# Patient Record
Sex: Male | Born: 1950
Health system: Southern US, Community
[De-identification: ages and names within clinical notes are randomized; demographics above are authoritative.]

## PROBLEM LIST (undated history)

## (undated) DIAGNOSIS — Z923 Personal history of irradiation: Secondary | ICD-10-CM

## (undated) DIAGNOSIS — K219 Gastro-esophageal reflux disease without esophagitis: Secondary | ICD-10-CM

## (undated) DIAGNOSIS — I10 Essential (primary) hypertension: Secondary | ICD-10-CM

## (undated) DIAGNOSIS — M109 Gout, unspecified: Secondary | ICD-10-CM

## (undated) HISTORY — DX: Gastro-esophageal reflux disease without esophagitis: K21.9

## (undated) HISTORY — DX: Essential (primary) hypertension: I10

## (undated) HISTORY — DX: Gout, unspecified: M10.9

## (undated) HISTORY — PX: BRAIN SURGERY: SHX531

---

## 1999-01-21 ENCOUNTER — Encounter: Payer: Self-pay | Admitting: Neurological Surgery

## 1999-01-21 ENCOUNTER — Inpatient Hospital Stay (HOSPITAL_COMMUNITY): Admission: EM | Admit: 1999-01-21 | Discharge: 1999-01-27 | Payer: Self-pay | Admitting: Emergency Medicine

## 1999-01-21 ENCOUNTER — Encounter: Payer: Self-pay | Admitting: Emergency Medicine

## 1999-01-22 ENCOUNTER — Encounter: Payer: Self-pay | Admitting: Neurological Surgery

## 1999-01-26 ENCOUNTER — Encounter: Payer: Self-pay | Admitting: Neurological Surgery

## 1999-02-03 ENCOUNTER — Inpatient Hospital Stay (HOSPITAL_COMMUNITY): Admission: EM | Admit: 1999-02-03 | Discharge: 1999-02-13 | Payer: Self-pay | Admitting: Emergency Medicine

## 1999-02-03 ENCOUNTER — Encounter: Payer: Self-pay | Admitting: Neurosurgery

## 1999-02-04 ENCOUNTER — Encounter: Payer: Self-pay | Admitting: Neurosurgery

## 1999-02-09 ENCOUNTER — Encounter: Payer: Self-pay | Admitting: Neurological Surgery

## 1999-02-12 ENCOUNTER — Encounter: Payer: Self-pay | Admitting: Neurosurgery

## 1999-02-15 ENCOUNTER — Inpatient Hospital Stay (HOSPITAL_COMMUNITY): Admission: AD | Admit: 1999-02-15 | Discharge: 1999-02-19 | Payer: Self-pay | Admitting: Neurological Surgery

## 1999-02-15 ENCOUNTER — Encounter: Payer: Self-pay | Admitting: Neurological Surgery

## 1999-02-15 ENCOUNTER — Ambulatory Visit (HOSPITAL_COMMUNITY): Admission: RE | Admit: 1999-02-15 | Discharge: 1999-02-15 | Payer: Self-pay | Admitting: Neurological Surgery

## 1999-02-19 ENCOUNTER — Encounter: Payer: Self-pay | Admitting: Neurological Surgery

## 1999-03-22 ENCOUNTER — Encounter: Payer: Self-pay | Admitting: Neurological Surgery

## 1999-03-22 ENCOUNTER — Ambulatory Visit (HOSPITAL_COMMUNITY): Admission: RE | Admit: 1999-03-22 | Discharge: 1999-03-22 | Payer: Self-pay | Admitting: Neurological Surgery

## 2005-09-25 ENCOUNTER — Ambulatory Visit (HOSPITAL_COMMUNITY): Admission: RE | Admit: 2005-09-25 | Discharge: 2005-09-25 | Payer: Self-pay | Admitting: Otolaryngology

## 2007-04-06 ENCOUNTER — Encounter: Admission: RE | Admit: 2007-04-06 | Discharge: 2007-04-06 | Payer: Self-pay | Admitting: Otolaryngology

## 2018-07-05 DIAGNOSIS — M79671 Pain in right foot: Secondary | ICD-10-CM | POA: Diagnosis not present

## 2018-07-05 DIAGNOSIS — M109 Gout, unspecified: Secondary | ICD-10-CM | POA: Diagnosis not present

## 2018-10-30 DIAGNOSIS — R030 Elevated blood-pressure reading, without diagnosis of hypertension: Secondary | ICD-10-CM | POA: Diagnosis not present

## 2018-10-30 DIAGNOSIS — M109 Gout, unspecified: Secondary | ICD-10-CM | POA: Diagnosis not present

## 2018-10-30 DIAGNOSIS — R000 Tachycardia, unspecified: Secondary | ICD-10-CM | POA: Diagnosis not present

## 2019-02-06 DIAGNOSIS — M109 Gout, unspecified: Secondary | ICD-10-CM | POA: Diagnosis not present

## 2019-02-06 DIAGNOSIS — R030 Elevated blood-pressure reading, without diagnosis of hypertension: Secondary | ICD-10-CM | POA: Diagnosis not present

## 2019-03-04 DIAGNOSIS — M1A3710 Chronic gout due to renal impairment, right ankle and foot, without tophus (tophi): Secondary | ICD-10-CM | POA: Diagnosis not present

## 2019-03-04 DIAGNOSIS — M2041 Other hammer toe(s) (acquired), right foot: Secondary | ICD-10-CM | POA: Diagnosis not present

## 2019-04-14 DIAGNOSIS — M2041 Other hammer toe(s) (acquired), right foot: Secondary | ICD-10-CM | POA: Diagnosis not present

## 2019-04-14 DIAGNOSIS — M1A3710 Chronic gout due to renal impairment, right ankle and foot, without tophus (tophi): Secondary | ICD-10-CM | POA: Diagnosis not present

## 2019-09-14 DIAGNOSIS — Z23 Encounter for immunization: Secondary | ICD-10-CM | POA: Diagnosis not present

## 2019-09-14 DIAGNOSIS — Z1211 Encounter for screening for malignant neoplasm of colon: Secondary | ICD-10-CM | POA: Diagnosis not present

## 2019-09-14 DIAGNOSIS — M10079 Idiopathic gout, unspecified ankle and foot: Secondary | ICD-10-CM | POA: Diagnosis not present

## 2019-09-14 DIAGNOSIS — I10 Essential (primary) hypertension: Secondary | ICD-10-CM | POA: Diagnosis not present

## 2019-09-14 DIAGNOSIS — Z1159 Encounter for screening for other viral diseases: Secondary | ICD-10-CM | POA: Diagnosis not present

## 2019-09-14 DIAGNOSIS — Z0000 Encounter for general adult medical examination without abnormal findings: Secondary | ICD-10-CM | POA: Diagnosis not present

## 2019-09-14 DIAGNOSIS — F17200 Nicotine dependence, unspecified, uncomplicated: Secondary | ICD-10-CM | POA: Diagnosis not present

## 2019-09-14 DIAGNOSIS — Z125 Encounter for screening for malignant neoplasm of prostate: Secondary | ICD-10-CM | POA: Diagnosis not present

## 2020-01-13 DIAGNOSIS — Z1211 Encounter for screening for malignant neoplasm of colon: Secondary | ICD-10-CM | POA: Diagnosis not present

## 2020-01-13 DIAGNOSIS — R195 Other fecal abnormalities: Secondary | ICD-10-CM | POA: Diagnosis not present

## 2020-01-13 DIAGNOSIS — Z791 Long term (current) use of non-steroidal anti-inflammatories (NSAID): Secondary | ICD-10-CM | POA: Diagnosis not present

## 2020-01-13 DIAGNOSIS — K219 Gastro-esophageal reflux disease without esophagitis: Secondary | ICD-10-CM | POA: Diagnosis not present

## 2020-01-20 DIAGNOSIS — Z1159 Encounter for screening for other viral diseases: Secondary | ICD-10-CM | POA: Diagnosis not present

## 2020-01-25 DIAGNOSIS — K2950 Unspecified chronic gastritis without bleeding: Secondary | ICD-10-CM | POA: Diagnosis not present

## 2020-01-25 DIAGNOSIS — R195 Other fecal abnormalities: Secondary | ICD-10-CM | POA: Diagnosis not present

## 2020-01-25 DIAGNOSIS — K2930 Chronic superficial gastritis without bleeding: Secondary | ICD-10-CM | POA: Diagnosis not present

## 2020-01-25 DIAGNOSIS — R12 Heartburn: Secondary | ICD-10-CM | POA: Diagnosis not present

## 2020-01-25 DIAGNOSIS — D122 Benign neoplasm of ascending colon: Secondary | ICD-10-CM | POA: Diagnosis not present

## 2020-01-25 DIAGNOSIS — K6289 Other specified diseases of anus and rectum: Secondary | ICD-10-CM | POA: Diagnosis not present

## 2020-01-25 DIAGNOSIS — K52831 Collagenous colitis: Secondary | ICD-10-CM | POA: Diagnosis not present

## 2020-01-25 DIAGNOSIS — K648 Other hemorrhoids: Secondary | ICD-10-CM | POA: Diagnosis not present

## 2020-01-25 DIAGNOSIS — K6389 Other specified diseases of intestine: Secondary | ICD-10-CM | POA: Diagnosis not present

## 2020-01-25 DIAGNOSIS — K3189 Other diseases of stomach and duodenum: Secondary | ICD-10-CM | POA: Diagnosis not present

## 2020-01-25 DIAGNOSIS — K449 Diaphragmatic hernia without obstruction or gangrene: Secondary | ICD-10-CM | POA: Diagnosis not present

## 2020-01-25 DIAGNOSIS — D123 Benign neoplasm of transverse colon: Secondary | ICD-10-CM | POA: Diagnosis not present

## 2020-01-31 DIAGNOSIS — D122 Benign neoplasm of ascending colon: Secondary | ICD-10-CM | POA: Diagnosis not present

## 2020-01-31 DIAGNOSIS — K2930 Chronic superficial gastritis without bleeding: Secondary | ICD-10-CM | POA: Diagnosis not present

## 2020-01-31 DIAGNOSIS — D123 Benign neoplasm of transverse colon: Secondary | ICD-10-CM | POA: Diagnosis not present

## 2020-01-31 DIAGNOSIS — K52831 Collagenous colitis: Secondary | ICD-10-CM | POA: Diagnosis not present

## 2020-02-16 DIAGNOSIS — K52831 Collagenous colitis: Secondary | ICD-10-CM | POA: Diagnosis not present

## 2020-02-16 DIAGNOSIS — Z791 Long term (current) use of non-steroidal anti-inflammatories (NSAID): Secondary | ICD-10-CM | POA: Diagnosis not present

## 2020-02-16 DIAGNOSIS — Z86010 Personal history of colonic polyps: Secondary | ICD-10-CM | POA: Diagnosis not present

## 2020-03-16 DIAGNOSIS — N289 Disorder of kidney and ureter, unspecified: Secondary | ICD-10-CM | POA: Diagnosis not present

## 2020-03-22 DIAGNOSIS — I10 Essential (primary) hypertension: Secondary | ICD-10-CM | POA: Diagnosis not present

## 2020-03-22 DIAGNOSIS — M10079 Idiopathic gout, unspecified ankle and foot: Secondary | ICD-10-CM | POA: Diagnosis not present

## 2020-03-22 DIAGNOSIS — Z7289 Other problems related to lifestyle: Secondary | ICD-10-CM | POA: Diagnosis not present

## 2020-03-22 DIAGNOSIS — K219 Gastro-esophageal reflux disease without esophagitis: Secondary | ICD-10-CM | POA: Diagnosis not present

## 2020-03-22 DIAGNOSIS — F17200 Nicotine dependence, unspecified, uncomplicated: Secondary | ICD-10-CM | POA: Diagnosis not present

## 2020-03-22 DIAGNOSIS — K52831 Collagenous colitis: Secondary | ICD-10-CM | POA: Diagnosis not present

## 2020-09-14 DIAGNOSIS — I10 Essential (primary) hypertension: Secondary | ICD-10-CM | POA: Diagnosis not present

## 2020-09-14 DIAGNOSIS — M10079 Idiopathic gout, unspecified ankle and foot: Secondary | ICD-10-CM | POA: Diagnosis not present

## 2020-09-14 DIAGNOSIS — N401 Enlarged prostate with lower urinary tract symptoms: Secondary | ICD-10-CM | POA: Diagnosis not present

## 2020-09-14 DIAGNOSIS — H6120 Impacted cerumen, unspecified ear: Secondary | ICD-10-CM | POA: Diagnosis not present

## 2020-09-14 DIAGNOSIS — Z0000 Encounter for general adult medical examination without abnormal findings: Secondary | ICD-10-CM | POA: Diagnosis not present

## 2020-09-14 DIAGNOSIS — R351 Nocturia: Secondary | ICD-10-CM | POA: Diagnosis not present

## 2020-09-14 DIAGNOSIS — Z23 Encounter for immunization: Secondary | ICD-10-CM | POA: Diagnosis not present

## 2020-09-14 DIAGNOSIS — K52831 Collagenous colitis: Secondary | ICD-10-CM | POA: Diagnosis not present

## 2020-09-14 DIAGNOSIS — Z125 Encounter for screening for malignant neoplasm of prostate: Secondary | ICD-10-CM | POA: Diagnosis not present

## 2020-09-14 DIAGNOSIS — K219 Gastro-esophageal reflux disease without esophagitis: Secondary | ICD-10-CM | POA: Diagnosis not present

## 2020-09-14 DIAGNOSIS — F17200 Nicotine dependence, unspecified, uncomplicated: Secondary | ICD-10-CM | POA: Diagnosis not present

## 2020-12-26 DIAGNOSIS — Z86010 Personal history of colonic polyps: Secondary | ICD-10-CM | POA: Diagnosis not present

## 2020-12-26 DIAGNOSIS — K219 Gastro-esophageal reflux disease without esophagitis: Secondary | ICD-10-CM | POA: Diagnosis not present

## 2020-12-26 DIAGNOSIS — K52831 Collagenous colitis: Secondary | ICD-10-CM | POA: Diagnosis not present

## 2021-08-16 DIAGNOSIS — M109 Gout, unspecified: Secondary | ICD-10-CM | POA: Diagnosis not present

## 2021-10-03 DIAGNOSIS — I10 Essential (primary) hypertension: Secondary | ICD-10-CM | POA: Diagnosis not present

## 2021-10-03 DIAGNOSIS — N401 Enlarged prostate with lower urinary tract symptoms: Secondary | ICD-10-CM | POA: Diagnosis not present

## 2021-10-03 DIAGNOSIS — R051 Acute cough: Secondary | ICD-10-CM | POA: Diagnosis not present

## 2021-10-03 DIAGNOSIS — Z125 Encounter for screening for malignant neoplasm of prostate: Secondary | ICD-10-CM | POA: Diagnosis not present

## 2021-10-03 DIAGNOSIS — Z0000 Encounter for general adult medical examination without abnormal findings: Secondary | ICD-10-CM | POA: Diagnosis not present

## 2021-10-03 DIAGNOSIS — K52831 Collagenous colitis: Secondary | ICD-10-CM | POA: Diagnosis not present

## 2021-10-03 DIAGNOSIS — F17200 Nicotine dependence, unspecified, uncomplicated: Secondary | ICD-10-CM | POA: Diagnosis not present

## 2021-10-03 DIAGNOSIS — M10079 Idiopathic gout, unspecified ankle and foot: Secondary | ICD-10-CM | POA: Diagnosis not present

## 2021-10-03 DIAGNOSIS — Z23 Encounter for immunization: Secondary | ICD-10-CM | POA: Diagnosis not present

## 2021-10-11 ENCOUNTER — Ambulatory Visit
Admission: RE | Admit: 2021-10-11 | Discharge: 2021-10-11 | Disposition: A | Discharge status: Home / Self Care | Payer: PPO | Source: Ambulatory Visit | Attending: Family Medicine | Admitting: Family Medicine

## 2021-10-11 ENCOUNTER — Other Ambulatory Visit: Payer: Self-pay

## 2021-10-11 ENCOUNTER — Other Ambulatory Visit: Payer: Self-pay | Admitting: Family Medicine

## 2021-10-11 DIAGNOSIS — R051 Acute cough: Secondary | ICD-10-CM

## 2021-10-11 DIAGNOSIS — R059 Cough, unspecified: Secondary | ICD-10-CM | POA: Diagnosis not present

## 2021-11-16 ENCOUNTER — Other Ambulatory Visit: Payer: Self-pay | Admitting: Family Medicine

## 2021-11-16 ENCOUNTER — Ambulatory Visit
Admission: RE | Admit: 2021-11-16 | Discharge: 2021-11-16 | Disposition: A | Discharge status: Home / Self Care | Payer: PPO | Source: Ambulatory Visit | Attending: Family Medicine | Admitting: Family Medicine

## 2021-11-16 DIAGNOSIS — J13 Pneumonia due to Streptococcus pneumoniae: Secondary | ICD-10-CM

## 2021-11-16 DIAGNOSIS — J181 Lobar pneumonia, unspecified organism: Secondary | ICD-10-CM | POA: Diagnosis not present

## 2021-11-16 DIAGNOSIS — R918 Other nonspecific abnormal finding of lung field: Secondary | ICD-10-CM | POA: Diagnosis not present

## 2021-11-20 ENCOUNTER — Other Ambulatory Visit: Payer: Self-pay | Admitting: Family Medicine

## 2021-11-20 DIAGNOSIS — R918 Other nonspecific abnormal finding of lung field: Secondary | ICD-10-CM

## 2021-11-21 DIAGNOSIS — J189 Pneumonia, unspecified organism: Secondary | ICD-10-CM | POA: Diagnosis not present

## 2021-11-21 DIAGNOSIS — I10 Essential (primary) hypertension: Secondary | ICD-10-CM | POA: Diagnosis not present

## 2021-11-21 DIAGNOSIS — R972 Elevated prostate specific antigen [PSA]: Secondary | ICD-10-CM | POA: Diagnosis not present

## 2021-11-21 DIAGNOSIS — F17200 Nicotine dependence, unspecified, uncomplicated: Secondary | ICD-10-CM | POA: Diagnosis not present

## 2021-12-14 ENCOUNTER — Ambulatory Visit
Admission: RE | Admit: 2021-12-14 | Discharge: 2021-12-14 | Disposition: A | Discharge status: Home / Self Care | Payer: PPO | Source: Ambulatory Visit | Attending: Family Medicine | Admitting: Family Medicine

## 2021-12-14 ENCOUNTER — Other Ambulatory Visit: Payer: Self-pay

## 2021-12-14 DIAGNOSIS — J439 Emphysema, unspecified: Secondary | ICD-10-CM | POA: Diagnosis not present

## 2021-12-14 DIAGNOSIS — J9811 Atelectasis: Secondary | ICD-10-CM | POA: Diagnosis not present

## 2021-12-14 DIAGNOSIS — Z8701 Personal history of pneumonia (recurrent): Secondary | ICD-10-CM | POA: Diagnosis not present

## 2021-12-14 DIAGNOSIS — I2510 Atherosclerotic heart disease of native coronary artery without angina pectoris: Secondary | ICD-10-CM | POA: Diagnosis not present

## 2021-12-14 DIAGNOSIS — R918 Other nonspecific abnormal finding of lung field: Secondary | ICD-10-CM

## 2021-12-14 MED ORDER — IOPAMIDOL (ISOVUE-300) INJECTION 61%
75.0000 mL | Freq: Once | INTRAVENOUS | Status: AC | PRN
  Administered 2021-12-14: 75 mL via INTRAVENOUS

## 2021-12-31 ENCOUNTER — Encounter: Payer: Self-pay | Admitting: Pulmonary Disease

## 2021-12-31 ENCOUNTER — Ambulatory Visit (INDEPENDENT_AMBULATORY_CARE_PROVIDER_SITE_OTHER): Payer: PPO | Admitting: Pulmonary Disease

## 2021-12-31 ENCOUNTER — Other Ambulatory Visit: Payer: Self-pay

## 2021-12-31 ENCOUNTER — Telehealth: Payer: Self-pay | Admitting: Pulmonary Disease

## 2021-12-31 VITALS — BP 122/66 | HR 96 | Temp 97.6°F | Ht 68.0 in | Wt 160.2 lb

## 2021-12-31 DIAGNOSIS — R599 Enlarged lymph nodes, unspecified: Secondary | ICD-10-CM | POA: Diagnosis not present

## 2021-12-31 DIAGNOSIS — R918 Other nonspecific abnormal finding of lung field: Secondary | ICD-10-CM

## 2021-12-31 DIAGNOSIS — C3490 Malignant neoplasm of unspecified part of unspecified bronchus or lung: Secondary | ICD-10-CM

## 2021-12-31 DIAGNOSIS — J984 Other disorders of lung: Secondary | ICD-10-CM

## 2021-12-31 NOTE — Progress Notes (Signed)
Synopsis: Referred in this for Jan 2023 By Shirline Frees, MD  Subjective:   PATIENT ID: Peter Diaz GENDER: male DOB: [birth year], MRN: [id number]  Chief Complaint  Patient presents with   Consult    Patient is here to talk about CT    This is a 71 year old gentleman, past medical history of hypertension, GERD, gout, longstanding history of tobacco abuse.  Patient presents for follow-up from pneumonia in October he had a left lobar pneumonia treated with Augmentin he had opacity within the chest did not resolve.  Ultimately underwent a CT scan of the chest.  Patient had a CT scan of the chest on December 14, 2021.  CT chest revealed a 7 x 5.3 necrotic left upper lobe mass with associated subcarinal adenopathy concerning for a advanced age bronchogenic carcinoma.  Patient was referred to pulmonary to discuss tissue sampling.  Patient denies hemoptysis.  He has been smoking since he was a teenager.   Past Medical History:  Diagnosis Date   GERD (gastroesophageal reflux disease)    Gout    HTN (hypertension)      Family History  Problem Relation Age of Onset   Stroke Mother    Stroke Father    Cancer Sister      Past Surgical History:  Procedure Laterality Date   BRAIN SURGERY      Social History   Socioeconomic History   Marital status: Married    Spouse name: Not on file   Number of children: Not on file   Years of education: Not on file   Highest education level: Not on file  Occupational History   Not on file  Tobacco Use   Smoking status: Not on file   Smokeless tobacco: Not on file  Substance and Sexual Activity   Alcohol use: Not on file   Drug use: Not on file   Sexual activity: Not on file  Other Topics Concern   Not on file  Social History Narrative   Not on file   Social Determinants of Health   Financial Resource Strain: Not on file  Food Insecurity: Not on file  Transportation Needs: Not on file  Physical Activity: Not on file  Stress: Not  on file  Social Connections: Not on file  Intimate Partner Violence: Not on file     Not on File   No outpatient medications prior to visit.   No facility-administered medications prior to visit.    Review of Systems  Constitutional:  Negative for chills, fever, malaise/fatigue and weight loss.  HENT:  Negative for hearing loss, sore throat and tinnitus.   Eyes:  Negative for blurred vision and double vision.  Respiratory:  Positive for cough and sputum production. Negative for hemoptysis, shortness of breath, wheezing and stridor.   Cardiovascular:  Negative for chest pain, palpitations, orthopnea, leg swelling and PND.  Gastrointestinal:  Negative for abdominal pain, constipation, diarrhea, heartburn, nausea and vomiting.  Genitourinary:  Negative for dysuria, hematuria and urgency.  Musculoskeletal:  Negative for joint pain and myalgias.  Skin:  Negative for itching and rash.  Neurological:  Negative for dizziness, tingling, weakness and headaches.  Endo/Heme/Allergies:  Negative for environmental allergies. Does not bruise/bleed easily.  Psychiatric/Behavioral:  Negative for depression. The patient is not nervous/anxious and does not have insomnia.   All other systems reviewed and are negative.   Objective:  Physical Exam Vitals reviewed.  Constitutional:      General: He is not in acute distress.  Appearance: He is well-developed.  HENT:     Head: Normocephalic and atraumatic.  Eyes:     General: No scleral icterus.    Conjunctiva/sclera: Conjunctivae normal.     Pupils: Pupils are equal, round, and reactive to light.  Neck:     Vascular: No JVD.     Trachea: No tracheal deviation.  Cardiovascular:     Rate and Rhythm: Normal rate and regular rhythm.     Heart sounds: Normal heart sounds. No murmur heard. Pulmonary:     Effort: Pulmonary effort is normal. No tachypnea, accessory muscle usage or respiratory distress.     Breath sounds: No stridor. Rhonchi  present. No wheezing or rales.     Comments: Diminished breath sounds bilaterally Abdominal:     General: There is no distension.     Palpations: Abdomen is soft.     Tenderness: There is no abdominal tenderness.  Musculoskeletal:        General: No tenderness.     Cervical back: Neck supple.  Lymphadenopathy:     Cervical: No cervical adenopathy.  Skin:    General: Skin is warm and dry.     Capillary Refill: Capillary refill takes less than 2 seconds.     Findings: No rash.  Neurological:     Mental Status: He is alert and oriented to person, place, and time.  Psychiatric:        Behavior: Behavior normal.     Vitals:   12/31/21 1416  BP: 122/66  Pulse: 96  Temp: 97.6 F (36.4 C)  TempSrc: Oral  SpO2: 96%  Weight: 160 lb 3.2 oz (72.7 kg)  Height: 5\' 8"  (1.727 m)   96% on RA BMI Readings from Last 3 Encounters:  12/31/21 24.36 kg/m   Wt Readings from Last 3 Encounters:  12/31/21 160 lb 3.2 oz (72.7 kg)     CBC No results found for: WBC, RBC, HGB, HCT, PLT, MCV, MCH, MCHC, RDW, LYMPHSABS, MONOABS, EOSABS, BASOSABS  Chest Imaging: CT scan of the chest, 12/14/2021: Cavitary mass within the left hilum, associated subcarinal adenopathy concerning for advanced age bronchogenic carcinoma. The patient's images have been independently reviewed by me.    Pulmonary Functions Testing Results: No flowsheet data found.  FeNO:   Pathology:   Echocardiogram:   Heart Catheterization:     Assessment & Plan:     ICD-10-CM   1. Cavitating mass of lung  J98.4 Ambulatory referral to Pulmonology    CANCELED: Procedural/ Surgical Case Request: VIDEO BRONCHOSCOPY WITH ENDOBRONCHIAL ULTRASOUND    2. Malignant neoplasm of unspecified part of unspecified bronchus or lung ([place])  C34.90 NM PET Image Initial (PI) Skull Base To Thigh (F-18 FDG)    3. Lung mass  R91.8 Ambulatory referral to Pulmonology    CANCELED: Procedural/ Surgical Case Request: VIDEO BRONCHOSCOPY WITH  ENDOBRONCHIAL ULTRASOUND    4. Adenopathy  R59.9       Discussion:  This is a 71 year old gentleman, past medical history of hypertension, GERD, alcohol use, longstanding tobacco abuse since teenager.  Found to have a 7 cm left-sided cavitary mass concerning for an advanced age bronchogenic carcinoma with associated subcarinal adenopathy  Plan: On today in the office we discussed the risk benefits and alternatives proceed with bronchoscopy to include videobronchoscope endobronchial ultrasound transbronchial needle aspiration of the subcarinal space. Patient is agreeable to this plan. We discussed the risk of bleeding and pneumothorax. Tentative bronchoscopy will be on 01/11/2022. Patient will also need a nuclear medicine PET  scan Also will need a brain MRI once we are able to establish diagnosis. Overall imaging is concerning for advanced age bronchogenic carcinoma. We appreciate consultation.  No current outpatient medications on file.  I spent 62 minutes dedicated to the care of this patient on the date of this encounter to include pre-visit review of records, face-to-face time with the patient discussing conditions above, post visit ordering of testing, clinical documentation with the electronic health record, making appropriate referrals as documented, and communicating necessary findings to members of the patients care team.   Garner Nash, DO [facility] 12/31/2021 2:35 PM

## 2021-12-31 NOTE — H&P (View-Only) (Signed)
Synopsis: Referred in this for Jan 2023 By Shirline Frees, MD  Subjective:   PATIENT ID: Peter Diaz GENDER: male DOB: 1951-10-23, MRN: [id number]  Chief Complaint  Patient presents with   Consult    Patient is here to talk about CT    This is a 71 year old gentleman, past medical history of hypertension, GERD, gout, longstanding history of tobacco abuse.  Patient presents for follow-up from pneumonia in October he had a left lobar pneumonia treated with Augmentin he had opacity within the chest did not resolve.  Ultimately underwent a CT scan of the chest.  Patient had a CT scan of the chest on December 14, 2021.  CT chest revealed a 7 x 5.3 necrotic left upper lobe mass with associated subcarinal adenopathy concerning for a advanced age bronchogenic carcinoma.  Patient was referred to pulmonary to discuss tissue sampling.  Patient denies hemoptysis.  He has been smoking since he was a teenager.   Past Medical History:  Diagnosis Date   GERD (gastroesophageal reflux disease)    Gout    HTN (hypertension)      Family History  Problem Relation Age of Onset   Stroke Mother    Stroke Father    Cancer Sister      Past Surgical History:  Procedure Laterality Date   BRAIN SURGERY      Social History   Socioeconomic History   Marital status: Married    Spouse name: Not on file   Number of children: Not on file   Years of education: Not on file   Highest education level: Not on file  Occupational History   Not on file  Tobacco Use   Smoking status: Not on file   Smokeless tobacco: Not on file  Substance and Sexual Activity   Alcohol use: Not on file   Drug use: Not on file   Sexual activity: Not on file  Other Topics Concern   Not on file  Social History Narrative   Not on file   Social Determinants of Health   Financial Resource Strain: Not on file  Food Insecurity: Not on file  Transportation Needs: Not on file  Physical Activity: Not on file  Stress: Not  on file  Social Connections: Not on file  Intimate Partner Violence: Not on file     Not on File   No outpatient medications prior to visit.   No facility-administered medications prior to visit.    Review of Systems  Constitutional:  Negative for chills, fever, malaise/fatigue and weight loss.  HENT:  Negative for hearing loss, sore throat and tinnitus.   Eyes:  Negative for blurred vision and double vision.  Respiratory:  Positive for cough and sputum production. Negative for hemoptysis, shortness of breath, wheezing and stridor.   Cardiovascular:  Negative for chest pain, palpitations, orthopnea, leg swelling and PND.  Gastrointestinal:  Negative for abdominal pain, constipation, diarrhea, heartburn, nausea and vomiting.  Genitourinary:  Negative for dysuria, hematuria and urgency.  Musculoskeletal:  Negative for joint pain and myalgias.  Skin:  Negative for itching and rash.  Neurological:  Negative for dizziness, tingling, weakness and headaches.  Endo/Heme/Allergies:  Negative for environmental allergies. Does not bruise/bleed easily.  Psychiatric/Behavioral:  Negative for depression. The patient is not nervous/anxious and does not have insomnia.   All other systems reviewed and are negative.   Objective:  Physical Exam Vitals reviewed.  Constitutional:      General: He is not in acute distress.  Appearance: He is well-developed.  HENT:     Head: Normocephalic and atraumatic.  Eyes:     General: No scleral icterus.    Conjunctiva/sclera: Conjunctivae normal.     Pupils: Pupils are equal, round, and reactive to light.  Neck:     Vascular: No JVD.     Trachea: No tracheal deviation.  Cardiovascular:     Rate and Rhythm: Normal rate and regular rhythm.     Heart sounds: Normal heart sounds. No murmur heard. Pulmonary:     Effort: Pulmonary effort is normal. No tachypnea, accessory muscle usage or respiratory distress.     Breath sounds: No stridor. Rhonchi  present. No wheezing or rales.     Comments: Diminished breath sounds bilaterally Abdominal:     General: There is no distension.     Palpations: Abdomen is soft.     Tenderness: There is no abdominal tenderness.  Musculoskeletal:        General: No tenderness.     Cervical back: Neck supple.  Lymphadenopathy:     Cervical: No cervical adenopathy.  Skin:    General: Skin is warm and dry.     Capillary Refill: Capillary refill takes less than 2 seconds.     Findings: No rash.  Neurological:     Mental Status: He is alert and oriented to person, place, and time.  Psychiatric:        Behavior: Behavior normal.     Vitals:   12/31/21 1416  BP: 122/66  Pulse: 96  Temp: 97.6 F (36.4 C)  TempSrc: Oral  SpO2: 96%  Weight: 160 lb 3.2 oz (72.7 kg)  Height: 5\' 8"  (1.727 m)   96% on RA BMI Readings from Last 3 Encounters:  12/31/21 24.36 kg/m   Wt Readings from Last 3 Encounters:  12/31/21 160 lb 3.2 oz (72.7 kg)     CBC No results found for: WBC, RBC, HGB, HCT, PLT, MCV, MCH, MCHC, RDW, LYMPHSABS, MONOABS, EOSABS, BASOSABS  Chest Imaging: CT scan of the chest, 12/14/2021: Cavitary mass within the left hilum, associated subcarinal adenopathy concerning for advanced age bronchogenic carcinoma. The patient's images have been independently reviewed by me.    Pulmonary Functions Testing Results: No flowsheet data found.  FeNO:   Pathology:   Echocardiogram:   Heart Catheterization:     Assessment & Plan:     ICD-10-CM   1. Cavitating mass of lung  J98.4 Ambulatory referral to Pulmonology    CANCELED: Procedural/ Surgical Case Request: VIDEO BRONCHOSCOPY WITH ENDOBRONCHIAL ULTRASOUND    2. Malignant neoplasm of unspecified part of unspecified bronchus or lung ([place])  C34.90 NM PET Image Initial (PI) Skull Base To Thigh (F-18 FDG)    3. Lung mass  R91.8 Ambulatory referral to Pulmonology    CANCELED: Procedural/ Surgical Case Request: VIDEO BRONCHOSCOPY WITH  ENDOBRONCHIAL ULTRASOUND    4. Adenopathy  R59.9       Discussion:  This is a 71 year old gentleman, past medical history of hypertension, GERD, alcohol use, longstanding tobacco abuse since teenager.  Found to have a 7 cm left-sided cavitary mass concerning for an advanced age bronchogenic carcinoma with associated subcarinal adenopathy  Plan: On today in the office we discussed the risk benefits and alternatives proceed with bronchoscopy to include videobronchoscope endobronchial ultrasound transbronchial needle aspiration of the subcarinal space. Patient is agreeable to this plan. We discussed the risk of bleeding and pneumothorax. Tentative bronchoscopy will be on 01/11/2022. Patient will also need a nuclear medicine PET  scan Also will need a brain MRI once we are able to establish diagnosis. Overall imaging is concerning for advanced age bronchogenic carcinoma. We appreciate consultation.  No current outpatient medications on file.  I spent 62 minutes dedicated to the care of this patient on the date of this encounter to include pre-visit review of records, face-to-face time with the patient discussing conditions above, post visit ordering of testing, clinical documentation with the electronic health record, making appropriate referrals as documented, and communicating necessary findings to members of the patients care team.   Garner Nash, DO [facility] 12/31/2021 2:35 PM

## 2021-12-31 NOTE — Telephone Encounter (Signed)
I sched pt for 2/3 at 7:30 at [facility]. Pt will go for covid test on 1/31. Pt will go for pet scan on 1/31 also. Gave appt info to pt with letter

## 2021-12-31 NOTE — Patient Instructions (Signed)
Thank you for visiting Dr. Valeta Harms at [facility]. Today we recommend the following:  Orders Placed This Encounter  Procedures   Procedural/ Surgical Case Request: VIDEO BRONCHOSCOPY WITH ENDOBRONCHIAL ULTRASOUND   NM PET Image Initial (PI) Skull Base To Thigh (F-18 FDG)   Ambulatory referral to Pulmonology   Approximately 1 week after biopsy Bronchoscopy hopefully on 01/11/2022  Return in about 18 days (around 01/18/2022) for with Eric Form, NP.    Please do your part to reduce the spread of COVID-19.

## 2022-01-03 ENCOUNTER — Ambulatory Visit: Admit: 2022-01-03 | Payer: PPO | Admitting: Pulmonary Disease

## 2022-01-03 SURGERY — BRONCHOSCOPY, WITH EBUS
Anesthesia: General | Laterality: Bilateral

## 2022-01-08 ENCOUNTER — Other Ambulatory Visit: Payer: Self-pay | Admitting: Pulmonary Disease

## 2022-01-08 ENCOUNTER — Ambulatory Visit (HOSPITAL_COMMUNITY)
Admission: RE | Admit: 2022-01-08 | Discharge: 2022-01-08 | Disposition: A | Discharge status: Home / Self Care | Payer: PPO | Source: Ambulatory Visit | Attending: Pulmonary Disease | Admitting: Pulmonary Disease

## 2022-01-08 ENCOUNTER — Other Ambulatory Visit: Payer: Self-pay

## 2022-01-08 DIAGNOSIS — C3490 Malignant neoplasm of unspecified part of unspecified bronchus or lung: Secondary | ICD-10-CM | POA: Diagnosis not present

## 2022-01-08 LAB — GLUCOSE, CAPILLARY: Glucose-Capillary: 102 mg/dL — ABNORMAL HIGH (ref 70–99)

## 2022-01-08 MED ORDER — FLUDEOXYGLUCOSE F - 18 (FDG) INJECTION
8.0000 | Freq: Once | INTRAVENOUS | Status: AC
  Administered 2022-01-08: 8.3 via INTRAVENOUS

## 2022-01-09 ENCOUNTER — Encounter (HOSPITAL_COMMUNITY): Payer: Self-pay | Admitting: Pulmonary Disease

## 2022-01-09 LAB — SARS CORONAVIRUS 2 (TAT 6-24 HRS): SARS Coronavirus 2: NEGATIVE

## 2022-01-09 NOTE — Progress Notes (Signed)
[facility]  Cardiologist - Denies  EP- Denies  Endocrine- Denies  Pulm- Denies  Chest x-ray - 11/16/21 (E)  EKG - 01/11/22- Day of surgery  Stress Test - Denies  ECHO - Denies  Cardiac Cath - Denies  AICD-na PM-na LOOP-na  Dialysis- Denies  Sleep Study - Denies CPAP - Denies  LABS- 01/11/22: CBC, BMP  ASA- Denies  ERAS- No  HA1C- Denies  Anesthesia- No  Pt denies having chest pain, sob, or fever during the pre-op phone call. All instructions explained to the pt, with a verbal understanding of the material including: as of today, stop taking all Aspirin (unless instructed by your doctor) and Other Aspirin containing products, Vitamins, Fish oils, and Herbal medications. Also stop all NSAIDS i.e. Advil, Ibuprofen, Motrin, Aleve, Anaprox, Naproxen, BC, Goody Powders, and all Supplements.  Pt also instructed to wear a mask and social distance  after being tested for COVID-19. The opportunity to ask questions was provided.    Coronavirus Screening  Have you experienced the following symptoms:  Cough yes/no: No Fever (>100.23F)  yes/no: No Runny nose yes/no: No Sore throat yes/no: No Difficulty breathing/shortness of breath  yes/no: No  Have you or a family member traveled in the last 14 days and where? yes/no: No   If the patient indicates "YES" to the above questions, their PAT will be rescheduled to limit the exposure to others and, the surgeon will be notified. THE PATIENT WILL NEED TO BE ASYMPTOMATIC FOR 14 DAYS.   If the patient is not experiencing any of these symptoms, the PAT nurse will instruct them to NOT bring anyone with them to their appointment since they may have these symptoms or traveled as well.   Please remind your patients and families that hospital visitation restrictions are in effect and the importance of the restrictions.

## 2022-01-10 NOTE — Anesthesia Preprocedure Evaluation (Addendum)
Anesthesia Evaluation  Patient identified by MRN, date of birth, ID band Patient awake    Reviewed: Allergy & Precautions, NPO status , Patient's Chart, lab work & pertinent test results  History of Anesthesia Complications Negative for: history of anesthetic complications  Airway Mallampati: II  TM Distance: >3 FB Neck ROM: Full    Dental no notable dental hx. (+) Dental Advisory Given   Pulmonary Current Smoker and Patient abstained from smoking.,  Lung mass   Pulmonary exam normal        Cardiovascular hypertension, Pt. on medications Normal cardiovascular exam     Neuro/Psych negative neurological ROS     GI/Hepatic Neg liver ROS, GERD  Medicated,  Endo/Other  negative endocrine ROS  Renal/GU negative Renal ROS     Musculoskeletal negative musculoskeletal ROS (+)   Abdominal   Peds  Hematology negative hematology ROS (+)   Anesthesia Other Findings   Reproductive/Obstetrics                            Anesthesia Physical Anesthesia Plan  ASA: 2  Anesthesia Plan: General   Post-op Pain Management: Tylenol PO (pre-op)   Induction:   PONV Risk Score and Plan: 1 and Ondansetron  Airway Management Planned: Oral ETT  Additional Equipment:   Intra-op Plan:   Post-operative Plan: Extubation in OR  Informed Consent: I have reviewed the patients History and Physical, chart, labs and discussed the procedure including the risks, benefits and alternatives for the proposed anesthesia with the patient or authorized representative who has indicated his/her understanding and acceptance.     Dental advisory given  Plan Discussed with: Anesthesiologist and CRNA  Anesthesia Plan Comments:        Anesthesia Quick Evaluation

## 2022-01-11 ENCOUNTER — Ambulatory Visit (HOSPITAL_COMMUNITY): Payer: PPO | Admitting: Anesthesiology

## 2022-01-11 ENCOUNTER — Encounter (HOSPITAL_COMMUNITY)
Admission: RE | Disposition: A | Discharge status: Home / Self Care | Payer: Self-pay | Source: Home / Self Care | Attending: Pulmonary Disease

## 2022-01-11 ENCOUNTER — Telehealth: Payer: Self-pay | Admitting: Radiation Oncology

## 2022-01-11 ENCOUNTER — Ambulatory Visit (HOSPITAL_COMMUNITY)
Admission: RE | Admit: 2022-01-11 | Discharge: 2022-01-11 | Disposition: A | Discharge status: Home / Self Care | Payer: PPO | Attending: Pulmonary Disease | Admitting: Pulmonary Disease

## 2022-01-11 ENCOUNTER — Encounter (HOSPITAL_COMMUNITY): Payer: Self-pay | Admitting: Pulmonary Disease

## 2022-01-11 ENCOUNTER — Encounter: Payer: Self-pay | Admitting: *Deleted

## 2022-01-11 ENCOUNTER — Other Ambulatory Visit: Payer: Self-pay

## 2022-01-11 DIAGNOSIS — F17200 Nicotine dependence, unspecified, uncomplicated: Secondary | ICD-10-CM | POA: Diagnosis not present

## 2022-01-11 DIAGNOSIS — R918 Other nonspecific abnormal finding of lung field: Secondary | ICD-10-CM

## 2022-01-11 DIAGNOSIS — I10 Essential (primary) hypertension: Secondary | ICD-10-CM | POA: Insufficient documentation

## 2022-01-11 DIAGNOSIS — R846 Abnormal cytological findings in specimens from respiratory organs and thorax: Secondary | ICD-10-CM | POA: Diagnosis not present

## 2022-01-11 DIAGNOSIS — C3412 Malignant neoplasm of upper lobe, left bronchus or lung: Secondary | ICD-10-CM | POA: Insufficient documentation

## 2022-01-11 DIAGNOSIS — R590 Localized enlarged lymph nodes: Secondary | ICD-10-CM | POA: Insufficient documentation

## 2022-01-11 DIAGNOSIS — R599 Enlarged lymph nodes, unspecified: Secondary | ICD-10-CM | POA: Diagnosis not present

## 2022-01-11 DIAGNOSIS — C3490 Malignant neoplasm of unspecified part of unspecified bronchus or lung: Secondary | ICD-10-CM | POA: Diagnosis not present

## 2022-01-11 HISTORY — PX: BIOPSY: SHX5522

## 2022-01-11 HISTORY — PX: VIDEO BRONCHOSCOPY WITH ENDOBRONCHIAL ULTRASOUND: SHX6177

## 2022-01-11 HISTORY — PX: BRONCHIAL NEEDLE ASPIRATION BIOPSY: SHX5106

## 2022-01-11 HISTORY — PX: BRONCHIAL BRUSHINGS: SHX5108

## 2022-01-11 LAB — POCT I-STAT, CHEM 8
BUN: 17 mg/dL (ref 8–23)
Calcium, Ion: 1.23 mmol/L (ref 1.15–1.40)
Chloride: 105 mmol/L (ref 98–111)
Creatinine, Ser: 1.3 mg/dL — ABNORMAL HIGH (ref 0.61–1.24)
Glucose, Bld: 100 mg/dL — ABNORMAL HIGH (ref 70–99)
HCT: 41 % (ref 39.0–52.0)
Hemoglobin: 13.9 g/dL (ref 13.0–17.0)
Potassium: 4.2 mmol/L (ref 3.5–5.1)
Sodium: 139 mmol/L (ref 135–145)
TCO2: 23 mmol/L (ref 22–32)

## 2022-01-11 LAB — CBC
HCT: 44.9 % (ref 39.0–52.0)
Hemoglobin: 14.2 g/dL (ref 13.0–17.0)
MCH: 27.4 pg (ref 26.0–34.0)
MCHC: 31.6 g/dL (ref 30.0–36.0)
MCV: 86.5 fL (ref 80.0–100.0)
Platelets: 490 10*3/uL — ABNORMAL HIGH (ref 150–400)
RBC: 5.19 MIL/uL (ref 4.22–5.81)
RDW: 13.9 % (ref 11.5–15.5)
WBC: 11.1 10*3/uL — ABNORMAL HIGH (ref 4.0–10.5)
nRBC: 0 % (ref 0.0–0.2)

## 2022-01-11 SURGERY — BRONCHOSCOPY, WITH EBUS
Anesthesia: General | Laterality: Left

## 2022-01-11 MED ORDER — PHENYLEPHRINE 40 MCG/ML (10ML) SYRINGE FOR IV PUSH (FOR BLOOD PRESSURE SUPPORT)
PREFILLED_SYRINGE | INTRAVENOUS | Status: DC | PRN
  Administered 2022-01-11: 120 ug via INTRAVENOUS
  Administered 2022-01-11: 40 ug via INTRAVENOUS
  Administered 2022-01-11: 160 ug via INTRAVENOUS
  Administered 2022-01-11: 80 ug via INTRAVENOUS

## 2022-01-11 MED ORDER — ROCURONIUM BROMIDE 10 MG/ML (PF) SYRINGE
PREFILLED_SYRINGE | INTRAVENOUS | Status: DC | PRN
  Administered 2022-01-11: 80 mg via INTRAVENOUS

## 2022-01-11 MED ORDER — ACETAMINOPHEN 500 MG PO TABS
1000.0000 mg | ORAL_TABLET | Freq: Once | ORAL | Status: AC
  Administered 2022-01-11: 1000 mg via ORAL
  Filled 2022-01-11: qty 2

## 2022-01-11 MED ORDER — FENTANYL CITRATE (PF) 100 MCG/2ML IJ SOLN
INTRAMUSCULAR | Status: DC | PRN
  Administered 2022-01-11: 50 ug via INTRAVENOUS

## 2022-01-11 MED ORDER — CHLORHEXIDINE GLUCONATE 0.12 % MT SOLN
OROMUCOSAL | Status: AC
  Administered 2022-01-11: 15 mL via OROMUCOSAL
  Filled 2022-01-11: qty 15

## 2022-01-11 MED ORDER — LACTATED RINGERS IV SOLN: INTRAVENOUS | Status: DC

## 2022-01-11 MED ORDER — SUGAMMADEX SODIUM 200 MG/2ML IV SOLN
INTRAVENOUS | Status: DC | PRN
Start: 2022-01-11 — End: 2022-01-11
  Administered 2022-01-11: 300 mg via INTRAVENOUS

## 2022-01-11 MED ORDER — ONDANSETRON HCL 4 MG/2ML IJ SOLN
INTRAMUSCULAR | Status: DC | PRN
  Administered 2022-01-11: 4 mg via INTRAVENOUS

## 2022-01-11 MED ORDER — EPHEDRINE SULFATE-NACL 50-0.9 MG/10ML-% IV SOSY
PREFILLED_SYRINGE | INTRAVENOUS | Status: DC | PRN
  Administered 2022-01-11: 5 mg via INTRAVENOUS

## 2022-01-11 MED ORDER — MIDAZOLAM HCL 5 MG/5ML IJ SOLN
INTRAMUSCULAR | Status: DC | PRN
  Administered 2022-01-11: 2 mg via INTRAVENOUS

## 2022-01-11 MED ORDER — ALBUTEROL SULFATE (2.5 MG/3ML) 0.083% IN NEBU
INHALATION_SOLUTION | RESPIRATORY_TRACT | Status: AC
  Filled 2022-01-11: qty 3

## 2022-01-11 MED ORDER — DEXAMETHASONE SODIUM PHOSPHATE 10 MG/ML IJ SOLN
INTRAMUSCULAR | Status: DC | PRN
  Administered 2022-01-11: 5 mg via INTRAVENOUS

## 2022-01-11 MED ORDER — ALBUTEROL SULFATE (2.5 MG/3ML) 0.083% IN NEBU
2.5000 mg | INHALATION_SOLUTION | Freq: Once | RESPIRATORY_TRACT | Status: AC
  Administered 2022-01-11: 2.5 mg via RESPIRATORY_TRACT

## 2022-01-11 MED ORDER — LIDOCAINE 2% (20 MG/ML) 5 ML SYRINGE
INTRAMUSCULAR | Status: DC | PRN
Start: 2022-01-11 — End: 2022-01-11
  Administered 2022-01-11: 100 mg via INTRAVENOUS

## 2022-01-11 MED ORDER — PROPOFOL 10 MG/ML IV BOLUS
INTRAVENOUS | Status: DC | PRN
  Administered 2022-01-11: 140 mg via INTRAVENOUS

## 2022-01-11 MED ORDER — PHENYLEPHRINE HCL-NACL 20-0.9 MG/250ML-% IV SOLN
INTRAVENOUS | Status: DC | PRN
  Administered 2022-01-11: 50 ug/min via INTRAVENOUS

## 2022-01-11 MED ORDER — CHLORHEXIDINE GLUCONATE 0.12 % MT SOLN: 15.0000 mL | Freq: Once | OROMUCOSAL | Status: AC

## 2022-01-11 SURGICAL SUPPLY — 30 items

## 2022-01-11 NOTE — Progress Notes (Signed)
Oncology Nurse Navigator Documentation  Oncology Nurse Navigator Flowsheets 01/11/2022  Abnormal Finding Date 11/16/2021  Confirmed Diagnosis Date 01/11/2022  Diagnosis Status Pathology Pending  Navigator Follow Up Date: 01/17/2022  Navigator Follow Up Reason: New Patient Appointment  Navigator Location [facility]  Referral Date to RadOnc/MedOnc 01/11/2022  Navigator Encounter Type Telephone  Telephone Outgoing Call  Patient Visit Type Initial  Treatment Phase Abnormal Scans  Barriers/Navigation Needs Education;Coordination of Care/I received referral from Dr. Valeta Harms on Peter Diaz today. I called and spoke to his wife and updated her on appt to see Dr. Julien Nordmann next week. She verbalized understanding.   Education Other  Interventions Coordination of Care;Education;Psycho-Social Support  Acuity Level 2-Minimal Needs (1-2 Barriers Identified)  Coordination of Care Appts  Education Method Verbal  Time Spent with Patient 45

## 2022-01-11 NOTE — Anesthesia Postprocedure Evaluation (Signed)
Anesthesia Post Note  Patient: Peter Diaz  Procedure(s) Performed: VIDEO BRONCHOSCOPY WITH ENDOBRONCHIAL ULTRASOUND (Left) BRONCHIAL BRUSHINGS BIOPSY BRONCHIAL NEEDLE ASPIRATION BIOPSIES     Patient location during evaluation: PACU Anesthesia Type: General Level of consciousness: sedated Pain management: pain level controlled Vital Signs Assessment: post-procedure vital signs reviewed and stable Respiratory status: spontaneous breathing and respiratory function stable Cardiovascular status: stable Postop Assessment: no apparent nausea or vomiting Anesthetic complications: no   No notable events documented.  Last Vitals:  Vitals:   01/11/22 0601 01/11/22 0823  BP: 126/69 132/79  Pulse: 88   Resp: 18 (!) 21  Temp: 36.6 C (!) 36.3 C  SpO2: 97% 99%    Last Pain:  Vitals:   01/11/22 0823  TempSrc:   PainSc: 0-No pain                 Kionte Baumgardner DANIEL

## 2022-01-11 NOTE — Discharge Instructions (Signed)
Flexible Bronchoscopy, Care After This sheet gives you information about how to care for yourself after your test. Your doctor may also give you more specific instructions. If you have problems or questions, contact your doctor. Follow these instructions at home: Eating and drinking Do not eat or drink anything (not even water) for 2 hours after your test, or until your numbing medicine (local anesthetic) wears off. When your numbness is gone and your cough and gag reflexes have come back, you may: Eat only soft foods. Slowly drink liquids. The day after the test, go back to your normal diet. Driving Do not drive for 24 hours if you were given a medicine to help you relax (sedative). Do not drive or use heavy machinery while taking prescription pain medicine. General instructions  Take over-the-counter and prescription medicines only as told by your doctor. Return to your normal activities as told. Ask what activities are safe for you. Do not use any products that have nicotine or tobacco in them. This includes cigarettes and e-cigarettes. If you need help quitting, ask your doctor. Keep all follow-up visits as told by your doctor. This is important. It is very important if you had a tissue sample (biopsy) taken. Get help right away if: You have shortness of breath that gets worse. You get light-headed. You feel like you are going to pass out (faint). You have chest pain. You cough up: More than a little blood. More blood than before. Summary Do not eat or drink anything (not even water) for 2 hours after your test, or until your numbing medicine wears off. Do not use cigarettes. Do not use e-cigarettes. Get help right away if you have chest pain.  This information is not intended to replace advice given to you by your health care provider. Make sure you discuss any questions you have with your health care provider. Document Released: 09/22/2009 Document Revised: 11/07/2017 Document  Reviewed: 12/13/2016 Elsevier Patient Education  2020 [employer].

## 2022-01-11 NOTE — Op Note (Signed)
Video Bronchoscopy with Endobronchial Ultrasound Procedure Note  Date of Operation: 01/11/2022  Pre-op Diagnosis: Lung mass, adenopathy   Post-op Diagnosis: Lung mass, adenopathy   Surgeon: Garner Nash, DO   Assistants: None   Anesthesia: General endotracheal anesthesia  Operation: Flexible video fiberoptic bronchoscopy with endobronchial ultrasound and biopsies.  Estimated Blood Loss: Minimal  Complications: None   Indications and History: Peter Diaz is a 71 y.o. male with Lung mass adenopathy.  The risks, benefits, complications, treatment options and expected outcomes were discussed with the patient.  The possibilities of pneumothorax, pneumonia, reaction to medication, pulmonary aspiration, perforation of a viscus, bleeding, failure to diagnose a condition and creating a complication requiring transfusion or operation were discussed with the patient who freely signed the consent.    Description of Procedure: The patient was examined in the preoperative area and history and data from the preprocedure consultation were reviewed. It was deemed appropriate to proceed.  The patient was taken to [facility] endoscopy room 3, identified as Peter Diaz and the procedure verified as Flexible Video Fiberoptic Bronchoscopy.  A Time Out was held and the above information confirmed. After being taken to the operating room general anesthesia was initiated and the patient  was orally intubated. The video fiberoptic bronchoscope was introduced via the endotracheal tube and a general inspection was performed which showed tumor infiltrating the left upper lobe, splitting of the left main carina, visible tumor infiltration in the submucosa and some endobronchial disease, near-total occlusion of the lingula, right lung appeared normal.. The standard scope was then withdrawn and the endobronchial ultrasound was used to identify and characterize the peritracheal, hilar and bronchial lymph nodes. Inspection  showed enlarged left hilar mass and subcarinal adenopathy. Using real-time ultrasound guidance Wang needle biopsies were take from Station 7 nodes and were sent for cytology.  We then exchanged scopes to standard therapeutic bronchoscope and aspiration of bilateral mainstem's necessary for removing remaining blood clots and debris.  Using [employer] 2.8 mm forceps we completed endobronchial biopsies to the left upper lobe visible tumor infiltration.  We also used cytology brush for specimen collection.  Saline was used for irrigation and aspiration of the mainstem to remove any remaining blood clots.  The bronchoscope was brought to just above the main carina there was no evidence of active bleeding.  The patient tolerated the procedure well without apparent complications. There was no significant blood loss. The bronchoscope was withdrawn. Anesthesia was reversed and the patient was taken to the PACU for recovery.   Samples: 1. Wang needle biopsies from 7 node 2.  Endobronchial left upper lobe forcep biopsies 3.  Cytology brushings of the left upper lobe  Plans:  The patient will be discharged from the PACU to home when recovered from anesthesia. We will review the cytology, pathology results with the patient when they become available. Outpatient followup will be with Garner Nash, DO.   Garner Nash, DO [facility] 01/11/2022 8:15 AM

## 2022-01-11 NOTE — Transfer of Care (Signed)
Immediate Anesthesia Transfer of Care Note  Patient: Peter Diaz  Procedure(s) Performed: VIDEO BRONCHOSCOPY WITH ENDOBRONCHIAL ULTRASOUND (Left) FINE NEEDLE ASPIRATION BRONCHIAL BRUSHINGS BIOPSY  Patient Location: PACU  Anesthesia Type:General  Level of Consciousness: awake, alert , oriented and patient cooperative  Airway & Oxygen Therapy: Patient Spontanous Breathing and Patient connected to face mask oxygen  Post-op Assessment: Report given to RN and Post -op Vital signs reviewed and stable  Post vital signs: Reviewed and stable  Last Vitals:  Vitals Value Taken Time  BP 132/79 01/11/2022 0825  Temp    Pulse 91 01/11/2022 0825  Resp 14 01/11/2022 0825  SpO2 97 01/11/2022 0825    Last Pain:  Vitals:   01/11/22 0610  TempSrc:   PainSc: 0-No pain      Patients Stated Pain Goal: 0 (40/10/27 2536)  Complications: No notable events documented.

## 2022-01-11 NOTE — Interval H&P Note (Signed)
History and Physical Interval Note:  01/11/2022 6:37 AM  Peter Diaz  has presented today for surgery, with the diagnosis of LEFT LUNG MASS.  The various methods of treatment have been discussed with the patient and family. After consideration of risks, benefits and other options for treatment, the patient has consented to  Procedure(s): [place] (Left) as a surgical intervention.  The patient's history has been reviewed, patient examined, no change in status, stable for surgery.  I have reviewed the patient's chart and labs.  Questions were answered to the patient's satisfaction.     [place]

## 2022-01-11 NOTE — Anesthesia Procedure Notes (Signed)
Procedure Name: Intubation Date/Time: 01/11/2022 7:36 AM Performed by: Colin Benton, CRNA Pre-anesthesia Checklist: Patient identified, Emergency Drugs available, Suction available and Patient being monitored Patient Re-evaluated:Patient Re-evaluated prior to induction Oxygen Delivery Method: Circle system utilized Preoxygenation: Pre-oxygenation with 100% oxygen Induction Type: IV induction Ventilation: Mask ventilation without difficulty Laryngoscope Size: Miller and 2 Grade View: Grade I Tube type: Oral Tube size: 8.5 mm Number of attempts: 1 Airway Equipment and Method: Stylet Placement Confirmation: ETT inserted through vocal cords under direct vision, positive ETCO2 and breath sounds checked- equal and bilateral Secured at: 23 cm Tube secured with: Tape Dental Injury: Teeth and Oropharynx as per pre-operative assessment

## 2022-01-11 NOTE — Telephone Encounter (Signed)
PCCM:  Bronchoscopy completed today.   MRI brain pending   Referrals placed to Dr. Julien Nordmann and radiation oncology   Garner Nash, DO [facility] 01/11/2022 8:20 AM

## 2022-01-14 ENCOUNTER — Encounter (HOSPITAL_COMMUNITY): Payer: Self-pay | Admitting: Pulmonary Disease

## 2022-01-14 NOTE — Progress Notes (Signed)
Location of tumor and Histology per Pathology Report: LUL lung  Biopsy: A. LUNG, LEFT UPPER LOBE, ENDOBRONCHIAL BIOPSY:  Invasive moderately differentiated squamous cell carcinoma (see comment)   A. LYMPH NODE, 7 NODE, FINE NEEDLE ASPIRATION:  FINAL MICROSCOPIC DIAGNOSIS:  - Suspicious for malignancy  - Rare atypical large cells suspicious for tumor   B. LUNG, ENDOBRONCHIAL, BRUSHING:  FINAL MICROSCOPIC DIAGNOSIS:  - Suspicious for malignancy  - Rare atypical large cells suspicious for tumor   Past/Anticipated interventions by surgeon, if any:   Surgeon: Garner Nash, DO  Operation: Flexible video fiberoptic bronchoscopy with endobronchial ultrasound and biopsies.  Past/Anticipated interventions by medical oncology, if any: Dr Curt Bears recommended a course of concurrent chemoradiation with weekly carboplatin for AUC of 2 and paclitaxel 45 Mg/M2 for 6-7 weeks followed by consolidation treatment with immunotherapy with Imfinzi if the patient has no evidence for disease progression after the induction phase.    Pain issues, if any:  no   SAFETY ISSUES: Prior radiation? no Pacemaker/ICD? no Possible current pregnancy? no Is the patient on methotrexate? no  Current Complaints / other details:  shortness of breath with exertion    Vitals:   01/21/22 0743  BP: 131/71  Pulse: (!) 105  Resp: 20  Temp: (!) 97.2 F (36.2 C)  SpO2: 99%  Weight: 162 lb (73.5 kg)  Height: 5\' 8"  (1.727 m)

## 2022-01-15 LAB — CYTOLOGY - NON PAP

## 2022-01-15 LAB — SURGICAL PATHOLOGY

## 2022-01-16 ENCOUNTER — Encounter: Payer: Self-pay | Admitting: *Deleted

## 2022-01-16 NOTE — Progress Notes (Signed)
Oncology Nurse Navigator Documentation  Oncology Nurse Navigator Flowsheets 01/16/2022 01/11/2022  Abnormal Finding Date - 11/16/2021  Confirmed Diagnosis Date - 01/11/2022  Diagnosis Status - Pathology Pending  Navigator Follow Up Date: - 01/17/2022  Navigator Follow Up Reason: - New Patient Appointment  Navigator Location [place]  Referral Date to RadOnc/MedOnc - 01/11/2022  Navigator Encounter Type Other: Telephone  Telephone - Outgoing Call  Patient Visit Type - Initial  Treatment Phase Pre-Tx/Tx Discussion Abnormal Scans  Barriers/Navigation Needs Coordination of Care Education;Coordination of Care  Education - Other  Interventions Coordination of Care/I checked to see if Mr. Steelman had molecular or PDL 1 testing completed on recent pathology. I did not find any information so I reached out to  hospital staff to see if they knew.  Wait for responds.  Coordination of Care;Education;Psycho-Social Support  Acuity Level 2-Minimal Needs (1-2 Barriers Identified) Level 2-Minimal Needs (1-2 Barriers Identified)  Coordination of Care Pathology Appts  Education Method - Verbal  Time Spent with Patient 15 45

## 2022-01-17 ENCOUNTER — Encounter: Payer: Self-pay | Admitting: *Deleted

## 2022-01-17 ENCOUNTER — Encounter: Payer: Self-pay | Admitting: Internal Medicine

## 2022-01-17 ENCOUNTER — Inpatient Hospital Stay: Payer: PPO

## 2022-01-17 ENCOUNTER — Inpatient Hospital Stay (HOSPITAL_BASED_OUTPATIENT_CLINIC_OR_DEPARTMENT_OTHER): Payer: PPO | Admitting: Internal Medicine

## 2022-01-17 ENCOUNTER — Other Ambulatory Visit: Payer: Self-pay

## 2022-01-17 DIAGNOSIS — Z5111 Encounter for antineoplastic chemotherapy: Secondary | ICD-10-CM | POA: Insufficient documentation

## 2022-01-17 DIAGNOSIS — Z510 Encounter for antineoplastic radiation therapy: Secondary | ICD-10-CM | POA: Insufficient documentation

## 2022-01-17 DIAGNOSIS — C3432 Malignant neoplasm of lower lobe, left bronchus or lung: Secondary | ICD-10-CM | POA: Diagnosis not present

## 2022-01-17 DIAGNOSIS — R918 Other nonspecific abnormal finding of lung field: Secondary | ICD-10-CM

## 2022-01-17 LAB — CBC WITH DIFFERENTIAL (CANCER CENTER ONLY)
Abs Immature Granulocytes: 0.09 10*3/uL — ABNORMAL HIGH (ref 0.00–0.07)
Basophils Absolute: 0.1 10*3/uL (ref 0.0–0.1)
Basophils Relative: 1 %
Eosinophils Absolute: 0.4 10*3/uL (ref 0.0–0.5)
Eosinophils Relative: 4 %
HCT: 42.4 % (ref 39.0–52.0)
Hemoglobin: 13.3 g/dL (ref 13.0–17.0)
Immature Granulocytes: 1 %
Lymphocytes Relative: 14 %
Lymphs Abs: 1.5 10*3/uL (ref 0.7–4.0)
MCH: 27.1 pg (ref 26.0–34.0)
MCHC: 31.4 g/dL (ref 30.0–36.0)
MCV: 86.5 fL (ref 80.0–100.0)
Monocytes Absolute: 1.2 10*3/uL — ABNORMAL HIGH (ref 0.1–1.0)
Monocytes Relative: 11 %
Neutro Abs: 7.5 10*3/uL (ref 1.7–7.7)
Neutrophils Relative %: 69 %
Platelet Count: 463 10*3/uL — ABNORMAL HIGH (ref 150–400)
RBC: 4.9 MIL/uL (ref 4.22–5.81)
RDW: 14.2 % (ref 11.5–15.5)
WBC Count: 10.7 10*3/uL — ABNORMAL HIGH (ref 4.0–10.5)
nRBC: 0 % (ref 0.0–0.2)

## 2022-01-17 LAB — CMP (CANCER CENTER ONLY)
ALT: 7 U/L (ref 0–44)
AST: 8 U/L — ABNORMAL LOW (ref 15–41)
Albumin: 3.8 g/dL (ref 3.5–5.0)
Alkaline Phosphatase: 104 U/L (ref 38–126)
Anion gap: 7 (ref 5–15)
BUN: 13 mg/dL (ref 8–23)
CO2: 28 mmol/L (ref 22–32)
Calcium: 9.1 mg/dL (ref 8.9–10.3)
Chloride: 103 mmol/L (ref 98–111)
Creatinine: 1.13 mg/dL (ref 0.61–1.24)
GFR, Estimated: >60 mL/min (ref >60)
Glucose, Bld: 95 mg/dL (ref 70–99)
Potassium: 4 mmol/L (ref 3.5–5.1)
Sodium: 138 mmol/L (ref 135–145)
Total Bilirubin: 0.3 mg/dL (ref 0.3–1.2)
Total Protein: 7.5 g/dL (ref 6.5–8.1)

## 2022-01-17 MED ORDER — PROCHLORPERAZINE MALEATE 10 MG PO TABS: 10.0000 mg | ORAL_TABLET | Freq: Four times a day (QID) | ORAL | 0 refills | Status: DC | PRN

## 2022-01-17 NOTE — Progress Notes (Signed)
[place] [phone]   [phone]  CONSULT NOTE  REFERRING PHYSICIAN: Dr. Leory Plowman Icard  REASON FOR CONSULTATION:  71 years old white male recently diagnosed with lung cancer.  HPI Peter Diaz is a 71 y.o. male with past medical history significant for hypertension, GERD as well as long history of smoking.  The patient was seen by his primary care physician Dr. Kenton Kingfisher in early November 2022 complaining of acute cough.  He had chest x-ray at that time on October 11, 2021 and that showed suspicious left lower lobe pneumonia.  He was treated with a course of antibiotics.  Few weeks later he had repeat chest x-ray on November 16, 2021 and that showed persistent airspace opacity in the left perihilar lung extending into the lung and associated with soft tissue prominence in the left hilum and this finding are suspicious for unresolved pneumonia or potential underlying malignancy.  He had CT scan of the chest with contrast on December 14, 2020 and that showed 7.0 x 5.3 cm necrotic left upper lobe mass which extended into the left hilar region and highly concerning for malignancy.  There was also left hilar and subcarinal adenopathy concerning for metastatic disease.  A PET scan was performed on January 08, 2022 and that showed the left hilar mass identified on the previous CT scan extending from the left hilum in the left upper lobe along the major fissure and distorting the major fissure with extension directly contiguous with the left hilum measuring 7.5 x 4.5 cm with SUV max of 24.1.  There was also cystic and cavitary changes noted in the left lower lobe just posterior to the major fissure also with increased metabolic activity with maximum SUV of 7.1 and this area measuring 2.5 x 1.7 cm.  The scan also showed left perihilar adenopathy measuring 1.3 cm with evidence of increased metabolic activity.  There was also subcarinal adenopathy measuring 1.2 cm with maximum  SUV of 5.6.  There was also small left sided effusion. The patient was referred to Dr. Valeta Harms and on February 32,023 he underwent video bronchoscopy with EBUS and biopsy of the left upper lobe as well as the lymphadenopathy.  The final pathology ([id number]) showed invasive moderately differentiated squamous cell carcinoma. The patient was referred to me today for evaluation and recommendation regarding treatment of his condition.  He is also scheduled to see Dr. Sondra Come on January 21, 2022 and also scheduled for MRI of the brain on January 22, 2022.Marland Kitchen When seen today he is feeling well except for cough and weight loss of around 30 pounds in the last few years.  He denied having any current chest pain, shortness of breath or hemoptysis.  He denied having any nausea, vomiting, diarrhea or constipation.  He denied having any headache or visual changes.  He has no current fever or chills. Family history significant for mother with stroke and father had cancer and died when the patient was young.  He also has a sister with lung cancer. The patient is married and has 1 Psychiatrist, Programmer, systems.  He was also accompanied by his wife Peter Diaz and his daughter Peter Diaz was available by phone during the visit. The patient used to work as a Facilities manager.  He has a history of smoking up to 2 packs/day for around 55 years and unfortunately he continues to smoke few cigarettes every day.  He also drinks 12 pack of beer every week.  He has no history of drug  abuse. HPI  Past Medical History:  Diagnosis Date   GERD (gastroesophageal reflux disease)    Gout    HTN (hypertension)     Past Surgical History:  Procedure Laterality Date   BIOPSY  01/11/2022   Procedure: BIOPSY;  Surgeon: Garner Nash, DO;  Location: [facility];  Service: Pulmonary;;   BRAIN SURGERY     BRONCHIAL BRUSHINGS  01/11/2022   Procedure: BRONCHIAL BRUSHINGS;  Surgeon: Garner Nash, DO;  Location: [facility];  Service: Pulmonary;;    BRONCHIAL NEEDLE ASPIRATION BIOPSY  01/11/2022   Procedure: BRONCHIAL NEEDLE ASPIRATION BIOPSIES;  Surgeon: Garner Nash, DO;  Location: [facility];  Service: Pulmonary;;   VIDEO BRONCHOSCOPY WITH ENDOBRONCHIAL ULTRASOUND Left 01/11/2022   Procedure: VIDEO BRONCHOSCOPY WITH ENDOBRONCHIAL ULTRASOUND;  Surgeon: Garner Nash, DO;  Location: [facility];  Service: Pulmonary;  Laterality: Left;    Family History  Problem Relation Age of Onset   Stroke Mother    Stroke Father    Cancer Sister     Social History Social History   Tobacco Use   Smoking status: Some Days    Packs/day: 0.80    Types: Cigarettes   Smokeless tobacco: Never  Vaping Use   Vaping Use: Never used  Substance Use Topics   Alcohol use: Yes   Drug use: Never    No Known Allergies  Current Outpatient Medications  Medication Sig Dispense Refill   amLODipine (NORVASC) 10 MG tablet Take 10 mg by mouth daily.     esomeprazole (NEXIUM) 20 MG capsule Take 20 mg by mouth daily at 12 noon.     tamsulosin (FLOMAX) 0.4 MG CAPS capsule Take 0.4 mg by mouth at bedtime.     No current facility-administered medications for this visit.    Review of Systems  Constitutional: positive for weight loss Eyes: negative Ears, nose, mouth, throat, and face: negative Respiratory: positive for cough Cardiovascular: negative Gastrointestinal: negative Genitourinary:negative Integument/breast: negative Hematologic/lymphatic: negative Musculoskeletal:negative Neurological: negative Behavioral/Psych: negative Endocrine: negative Allergic/Immunologic: negative  Physical Exam  HXT:AVWPV, healthy, no distress, well nourished, and well developed SKIN: skin color, texture, turgor are normal, no rashes or significant lesions HEAD: Normocephalic, No masses, lesions, tenderness or abnormalities EYES: normal, PERRLA, Conjunctiva are pink and non-injected EARS: External ears normal, Canals clear OROPHARYNX:no exudate, no  erythema, and lips, buccal mucosa, and tongue normal  NECK: supple, no adenopathy, no JVD LYMPH:  no palpable lymphadenopathy, no hepatosplenomegaly LUNGS: clear to auscultation , and palpation HEART: regular rate & rhythm, no murmurs, and no gallops ABDOMEN:abdomen soft, non-tender, normal bowel sounds, and no masses or organomegaly BACK: Back symmetric, no curvature., No CVA tenderness EXTREMITIES:no joint deformities, effusion, or inflammation, no edema  NEURO: alert & oriented x 3 with fluent speech, no focal motor/sensory deficits  PERFORMANCE STATUS: ECOG 1  LABORATORY DATA: Lab Results  Component Value Date   WBC 10.7 (H) 01/17/2022   HGB 13.3 01/17/2022   HCT 42.4 01/17/2022   MCV 86.5 01/17/2022   PLT 463 (H) 01/17/2022      Chemistry      Component Value Date/Time   NA 138 01/17/2022 1338   K 4.0 01/17/2022 1338   CL 103 01/17/2022 1338   CO2 28 01/17/2022 1338   BUN 13 01/17/2022 1338   CREATININE 1.13 01/17/2022 1338      Component Value Date/Time   CALCIUM 9.1 01/17/2022 1338   ALKPHOS 104 01/17/2022 1338   AST 8 (L) 01/17/2022 1338   ALT 7  01/17/2022 1338   BILITOT 0.3 01/17/2022 1338       RADIOGRAPHIC STUDIES: NM PET Image Initial (PI) Skull Base To Thigh (F-18 FDG)  Result Date: 01/09/2022 CLINICAL DATA:  Initial treatment strategy for suspected bronchogenic neoplasm, non-small cell lung cancer with upcoming bronchoscopy for LEFT upper lobe mass. EXAM: NUCLEAR MEDICINE PET SKULL BASE TO THIGH TECHNIQUE: 8.3 mCi F-18 FDG was injected intravenously. Full-ring PET imaging was performed from the skull base to thigh after the radiotracer. CT data was obtained and used for attenuation correction and anatomic localization. Fasting blood glucose: 102 mg/dl COMPARISON:  Comparison is made with December 14, 2021 chest CT. FINDINGS: Mediastinal blood pool activity: SUV max 2.44 Liver activity: SUV max NA NECK: No hypermetabolic lymph nodes in the neck. Incidental CT  findings: Hypoattenuation in the LEFT frontal region corresponding diminished FDG uptake in this area compatible with encephalomalacia in this patient with prior "brain surgery" and reported prior abscesses in the LEFT frontal lobe, this area is incompletely imaged and without comparison aside from prior reports. Complete opacification of the RIGHT maxillary sinus compatible with chronic sinusitis. Signs of sinus disease with partial opacification of the LEFT maxillary sinus and scattered opacification of ethmoid sinuses as well as the RIGHT frontal sinus. CHEST: The LEFT hilar mass that was identified on the recent chest CT extending from the LEFT hilum in the LEFT upper lobe along the major fissure, distorting the major fissure and with extension also, directly contiguous with the LEFT hilum is. This measures 7.5 x 4.5 cm greatest axial dimension and when measured in a similar fashion on the prior study measured approximately 7.2 x 4.7 cm. Maximum SUV in this area is 24.1. Cystic and or cavitary changes are also noted in the LEFT lower lobe just posterior to the major fissure also with increased metabolic activity with a maximum SUV of 7.1 (image 95/4) this area measuring approximately 2.5 x 1.7 cm. LEFT perihilar adenopathy (image 76/4) 13 mm short axis, in nearly contiguous with the dominant mass and with evidence of increased metabolic activity with similar FDG uptake. Subcarinal adenopathy (image 80/4) 12 mm with a maximum SUV of 5.6. No additional areas of increased metabolic activity in the chest. Incidental CT findings: Background pulmonary emphysema. Narrowing of airways at the LEFT hilum secondary to LEFT hilar mass. Small LEFT-sided pleural effusion layers dependently without associated increased metabolic activity with focal characteristics at this time. Calcified aortic atherosclerosis. Mass also abuts the superior aspect of the LEFT heart border but there is no pericardial effusion. The heart size is  normal and there is calcified coronary artery disease. Limited assessment of cardiovascular structures given lack of intravenous contrast. ABDOMEN/PELVIS: No abnormal hypermetabolic activity within the liver, pancreas, adrenal glands, or spleen. No hypermetabolic lymph nodes in the abdomen or pelvis. Incidental CT findings: Cholelithiasis. No acute findings relative to the liver, gallbladder, pancreas, spleen, adrenal glands or kidneys. No acute gastrointestinal process. Aortic atherosclerosis without aneurysm. Prostatomegaly. SKELETON: No focal hypermetabolic activity to suggest skeletal metastasis. Incidental CT findings: Spinal degenerative changes. IMPRESSION: Cavitary hypermetabolic LEFT hilar mass inseparable from the LEFT hilum and tracking along the major fissure into the peripheral posterior LEFT upper lobe, associated with subcarinal and LEFT hilar adenopathy with hypermetabolic features, highly suspicious for bronchogenic neoplasm with associated mediastinal adenopathy. Increased metabolic activity also noted in the adjacent LEFT lower lobe favored to represent dilated bronchial structures with postobstructive changes. Additional site of disease not entirely excluded at this time and associated with increased metabolic  activity as above. Small LEFT-sided effusion. Calcified atherosclerosis of the thoracic and abdominal aorta, calcified coronary artery disease and pulmonary emphysema. Cholelithiasis. Extensive sinus disease worse in the RIGHT maxillary sinus. Correlate with any symptoms of chronic sinusitis. Incidental findings in the brain likely related to prior is surgery about the LEFT frontal lobe that is reported in the patient's history and made reference to on prior imaging reports. Aortic Atherosclerosis (ICD10-I70.0) and Emphysema (ICD10-J43.9). Electronically Signed   By: Zetta Bills M.D.   On: 01/09/2022 08:28    ASSESSMENT: This is a very pleasant 71 years old white male recently  diagnosed with stage IIIb (T4, N2, M0) non-small cell lung cancer, squamous cell carcinoma presented with large left upper lobe lung mass with left hilar and subcarinal lymphadenopathy diagnosed in February 2023.   PLAN: I had a lengthy discussion with the patient and his wife today about his current disease stage, prognosis and treatment options. I recommended for the patient to complete the staging work-up by having the MRI performed as a scheduled next week. His tissue block and blood test was sent to [facility] for molecular studies and PD-L1 expression but these results are still pending. I discussed with the patient his treatment options and I recommended for him a course of concurrent chemoradiation with weekly carboplatin for AUC of 2 and paclitaxel 45 Mg/M2 for 6-7 weeks followed by consolidation treatment with immunotherapy with Imfinzi if the patient has no evidence for disease progression after the induction phase. I discussed with the patient the adverse effect of the chemotherapy including but not limited to alopecia, myelosuppression, nausea and vomiting, peripheral neuropathy, liver or renal dysfunction. He is expected to start the first dose of this treatment on January 28, 2022. If the MRI of the brain showed evidence for metastatic disease to the brain, his treatment plan will change to more systemic chemotherapy and less radiation. He will have a chemotherapy education class before the first cycle of his treatment. I will call his pharmacy with prescription for Compazine 10 mg p.o. every 6 hours as needed for nausea. The patient will come back for follow-up visit on February 04, 2022 for management of any adverse effect of his treatment. I also strongly encouraged the patient to quit smoking. He was advised to call immediately if he has any other concerning symptoms in the interval. The patient voices understanding of current disease status and treatment options and is in  agreement with the current care plan.  All questions were answered. The patient knows to call the clinic with any problems, questions or concerns. We can certainly see the patient much sooner if necessary.  Thank you so much for allowing me to participate in the care of Peter Diaz. I will continue to follow up the patient with you and assist in his care.  The total time spent in the appointment was 90 minutes.  Disclaimer: This note was dictated with voice recognition software. Similar sounding words can inadvertently be transcribed and may not be corrected upon review.   Eilleen Kempf January 17, 2022, 2:34 PM

## 2022-01-17 NOTE — Patient Instructions (Signed)
Carboplatin injection What is this medication? CARBOPLATIN (KAR boe pla tin) is a chemotherapy drug. It targets fast dividing cells, like cancer cells, and causes these cells to die. This medicine is used to treat ovarian cancer and many other cancers. This medicine may be used for other purposes; ask your health care provider or pharmacist if you have questions. COMMON BRAND NAME(S): Paraplatin What should I tell my care team before I take this medication? They need to know if you have any of these conditions: blood disorders hearing problems kidney disease recent or ongoing radiation therapy an unusual or allergic reaction to carboplatin, cisplatin, other chemotherapy, other medicines, foods, dyes, or preservatives pregnant or trying to get pregnant breast-feeding How should I use this medication? This drug is usually given as an infusion into a vein. It is administered in a hospital or clinic by a specially trained health care professional. Talk to your pediatrician regarding the use of this medicine in children. Special care may be needed. Overdosage: If you think you have taken too much of this medicine contact a poison control center or emergency room at once. NOTE: This medicine is only for you. Do not share this medicine with others. What if I miss a dose? It is important not to miss a dose. Call your doctor or health care professional if you are unable to keep an appointment. What may interact with this medication? medicines for seizures medicines to increase blood counts like filgrastim, pegfilgrastim, sargramostim some antibiotics like amikacin, gentamicin, neomycin, streptomycin, tobramycin vaccines Talk to your doctor or health care professional before taking any of these medicines: acetaminophen aspirin ibuprofen ketoprofen naproxen This list may not describe all possible interactions. Give your health care provider a list of all the medicines, herbs, non-prescription  drugs, or dietary supplements you use. Also tell them if you smoke, drink alcohol, or use illegal drugs. Some items may interact with your medicine. What should I watch for while using this medication? Your condition will be monitored carefully while you are receiving this medicine. You will need important blood work done while you are taking this medicine. This drug may make you feel generally unwell. This is not uncommon, as chemotherapy can affect healthy cells as well as cancer cells. Report any side effects. Continue your course of treatment even though you feel ill unless your doctor tells you to stop. In some cases, you may be given additional medicines to help with side effects. Follow all directions for their use. Call your doctor or health care professional for advice if you get a fever, chills or sore throat, or other symptoms of a cold or flu. Do not treat yourself. This drug decreases your body's ability to fight infections. Try to avoid being around people who are sick. This medicine may increase your risk to bruise or bleed. Call your doctor or health care professional if you notice any unusual bleeding. Be careful brushing and flossing your teeth or using a toothpick because you may get an infection or bleed more easily. If you have any dental work done, tell your dentist you are receiving this medicine. Avoid taking products that contain aspirin, acetaminophen, ibuprofen, naproxen, or ketoprofen unless instructed by your doctor. These medicines may hide a fever. Do not become pregnant while taking this medicine. Women should inform their doctor if they wish to become pregnant or think they might be pregnant. There is a potential for serious side effects to an unborn child. Talk to your health care professional or pharmacist  for more information. Do not breast-feed an infant while taking this medicine. What side effects may I notice from receiving this medication? Side effects that you  should report to your doctor or health care professional as soon as possible: allergic reactions like skin rash, itching or hives, swelling of the face, lips, or tongue signs of infection - fever or chills, cough, sore throat, pain or difficulty passing urine signs of decreased platelets or bleeding - bruising, pinpoint red spots on the skin, black, tarry stools, nosebleeds signs of decreased red blood cells - unusually weak or tired, fainting spells, lightheadedness breathing problems changes in hearing changes in vision chest pain high blood pressure low blood counts - This drug may decrease the number of white blood cells, red blood cells and platelets. You may be at increased risk for infections and bleeding. nausea and vomiting pain, swelling, redness or irritation at the injection site pain, tingling, numbness in the hands or feet problems with balance, talking, walking trouble passing urine or change in the amount of urine Side effects that usually do not require medical attention (report to your doctor or health care professional if they continue or are bothersome): hair loss loss of appetite metallic taste in the mouth or changes in taste This list may not describe all possible side effects. Call your doctor for medical advice about side effects. You may report side effects to FDA at 1-800-FDA-1088. Where should I keep my medication? This drug is given in a hospital or clinic and will not be stored at home. NOTE: This sheet is a summary. It may not cover all possible information. If you have questions about this medicine, talk to your doctor, pharmacist, or health care provider.  2022 Elsevier/Gold Standard (2008-05-04 00:00:00) Paclitaxel injection What is this medication? PACLITAXEL (PAK li TAX el) is a chemotherapy drug. It targets fast dividing cells, like cancer cells, and causes these cells to die. This medicine is used to treat ovarian cancer, breast cancer, lung cancer,  Kaposi's sarcoma, and other cancers. This medicine may be used for other purposes; ask your health care provider or pharmacist if you have questions. COMMON BRAND NAME(S): Onxol, Taxol What should I tell my care team before I take this medication? They need to know if you have any of these conditions: history of irregular heartbeat liver disease low blood counts, like low white cell, platelet, or red cell counts lung or breathing disease, like asthma tingling of the fingers or toes, or other nerve disorder an unusual or allergic reaction to paclitaxel, alcohol, polyoxyethylated castor oil, other chemotherapy, other medicines, foods, dyes, or preservatives pregnant or trying to get pregnant breast-feeding How should I use this medication? This drug is given as an infusion into a vein. It is administered in a hospital or clinic by a specially trained health care professional. Talk to your pediatrician regarding the use of this medicine in children. Special care may be needed. Overdosage: If you think you have taken too much of this medicine contact a poison control center or emergency room at once. NOTE: This medicine is only for you. Do not share this medicine with others. What if I miss a dose? It is important not to miss your dose. Call your doctor or health care professional if you are unable to keep an appointment. What may interact with this medication? Do not take this medicine with any of the following medications: live virus vaccines This medicine may also interact with the following medications: antiviral medicines for hepatitis,  HIV or AIDS certain antibiotics like erythromycin and clarithromycin certain medicines for fungal infections like ketoconazole and itraconazole certain medicines for seizures like carbamazepine, phenobarbital, phenytoin gemfibrozil nefazodone rifampin St. John's wort This list may not describe all possible interactions. Give your health care provider  a list of all the medicines, herbs, non-prescription drugs, or dietary supplements you use. Also tell them if you smoke, drink alcohol, or use illegal drugs. Some items may interact with your medicine. What should I watch for while using this medication? Your condition will be monitored carefully while you are receiving this medicine. You will need important blood work done while you are taking this medicine. This medicine can cause serious allergic reactions. To reduce your risk you will need to take other medicine(s) before treatment with this medicine. If you experience allergic reactions like skin rash, itching or hives, swelling of the face, lips, or tongue, tell your doctor or health care professional right away. In some cases, you may be given additional medicines to help with side effects. Follow all directions for their use. This drug may make you feel generally unwell. This is not uncommon, as chemotherapy can affect healthy cells as well as cancer cells. Report any side effects. Continue your course of treatment even though you feel ill unless your doctor tells you to stop. Call your doctor or health care professional for advice if you get a fever, chills or sore throat, or other symptoms of a cold or flu. Do not treat yourself. This drug decreases your body's ability to fight infections. Try to avoid being around people who are sick. This medicine may increase your risk to bruise or bleed. Call your doctor or health care professional if you notice any unusual bleeding. Be careful brushing and flossing your teeth or using a toothpick because you may get an infection or bleed more easily. If you have any dental work done, tell your dentist you are receiving this medicine. Avoid taking products that contain aspirin, acetaminophen, ibuprofen, naproxen, or ketoprofen unless instructed by your doctor. These medicines may hide a fever. Do not become pregnant while taking this medicine. Women should  inform their doctor if they wish to become pregnant or think they might be pregnant. There is a potential for serious side effects to an unborn child. Talk to your health care professional or pharmacist for more information. Do not breast-feed an infant while taking this medicine. Men are advised not to father a child while receiving this medicine. This product may contain alcohol. Ask your pharmacist or healthcare provider if this medicine contains alcohol. Be sure to tell all healthcare providers you are taking this medicine. Certain medicines, like metronidazole and disulfiram, can cause an unpleasant reaction when taken with alcohol. The reaction includes flushing, headache, nausea, vomiting, sweating, and increased thirst. The reaction can last from 30 minutes to several hours. What side effects may I notice from receiving this medication? Side effects that you should report to your doctor or health care professional as soon as possible: allergic reactions like skin rash, itching or hives, swelling of the face, lips, or tongue breathing problems changes in vision fast, irregular heartbeat high or low blood pressure mouth sores pain, tingling, numbness in the hands or feet signs of decreased platelets or bleeding - bruising, pinpoint red spots on the skin, black, tarry stools, blood in the urine signs of decreased red blood cells - unusually weak or tired, feeling faint or lightheaded, falls signs of infection - fever or chills, cough,  sore throat, pain or difficulty passing urine signs and symptoms of liver injury like dark yellow or brown urine; general ill feeling or flu-like symptoms; light-colored stools; loss of appetite; nausea; right upper belly pain; unusually weak or tired; yellowing of the eyes or skin swelling of the ankles, feet, hands unusually slow heartbeat Side effects that usually do not require medical attention (report to your doctor or health care professional if they  continue or are bothersome): diarrhea hair loss loss of appetite muscle or joint pain nausea, vomiting pain, redness, or irritation at site where injected tiredness This list may not describe all possible side effects. Call your doctor for medical advice about side effects. You may report side effects to FDA at 1-800-FDA-1088. Where should I keep my medication? This drug is given in a hospital or clinic and will not be stored at home. NOTE: This sheet is a summary. It may not cover all possible information. If you have questions about this medicine, talk to your doctor, pharmacist, or health care provider.  2022 Elsevier/Gold Standard (2021-08-14 00:00:00) Managing the Challenge of Quitting Smoking Quitting smoking is a physical and mental challenge. You will face cravings, withdrawal symptoms, and temptation. Before quitting, work with your health care provider to make a plan that can help you manage quitting. Preparation can help you quit and keep you from giving in. How to manage lifestyle changes Managing stress Stress can make you want to smoke, and wanting to smoke may cause stress. It is important to find ways to manage your stress. You might try some of the following: Practice relaxation techniques. Breathe slowly and deeply, in through your nose and out through your mouth. Listen to music. Soak in a bath or take a shower. Imagine a peaceful place or vacation. Get some support. Talk with family or friends about your stress. Join a support group. Talk with a counselor or therapist. Get some physical activity. Go for a walk, run, or bike ride. Play a favorite sport. Practice yoga.  Medicines Talk with your health care provider about medicines that might help you deal with cravings and make quitting easier for you. Relationships Social situations can be difficult when you are quitting smoking. To manage this, you can: Avoid parties and other social situations where people  might be smoking. Avoid alcohol. Leave right away if you have the urge to smoke. Explain to your family and friends that you are quitting smoking. Ask for support and let them know you might be a bit grumpy. Plan activities where smoking is not an option. General instructions Be aware that many people gain weight after they quit smoking. However, not everyone does. To keep from gaining weight, have a plan in place before you quit and stick to the plan after you quit. Your plan should include: Having healthy snacks. When you have a craving, it may help to: Eat popcorn, carrots, celery, or other cut vegetables. Chew sugar-free gum. Changing how you eat. Eat small portion sizes at meals. Eat 4-6 small meals throughout the day instead of 1-2 large meals a day. Be mindful when you eat. Do not watch television or do other things that might distract you as you eat. Exercising regularly. Make time to exercise each day. If you do not have time for a long workout, do short bouts of exercise for 5-10 minutes several times a day. Do some form of strengthening exercise, such as weight lifting. Do some exercise that gets your heart beating and causes you to  breathe deeply, such as walking fast, running, swimming, or biking. This is very important. Drinking plenty of water or other low-calorie or no-calorie drinks. Drink 6-8 glasses of water daily.  How to recognize withdrawal symptoms Your body and mind may experience discomfort as you try to get used to not having nicotine in your system. These effects are called withdrawal symptoms. They may include: Feeling hungrier than normal. Having trouble concentrating. Feeling irritable or restless. Having trouble sleeping. Feeling depressed. Craving a cigarette. To manage withdrawal symptoms: Avoid places, people, and activities that trigger your cravings. Remember why you want to quit. Get plenty of sleep. Avoid coffee and other caffeinated drinks. These  may worsen some of your symptoms. These symptoms may surprise you. But be assured that they are normal to have when quitting smoking. How to manage cravings Come up with a plan for how to deal with your cravings. The plan should include the following: A definition of the specific situation you want to deal with. An alternative action you will take. A clear idea for how this action will help. The name of someone who might help you with this. Cravings usually last for 5-10 minutes. Consider taking the following actions to help you with your plan to deal with cravings: Keep your mouth busy. Chew sugar-free gum. Suck on hard candies or a straw. Brush your teeth. Keep your hands and body busy. Change to a different activity right away. Squeeze or play with a ball. Do an activity or a hobby, such as making bead jewelry, practicing needlepoint, or working with wood. Mix up your normal routine. Take a short exercise break. Go for a quick walk or run up and down stairs. Focus on doing something kind or helpful for someone else. Call a friend or family member to talk during a craving. Join a support group. Contact a quitline. Where to find support To get help or find a support group: Call the [facility]'s Smoking Quitline: 1-800-QUIT NOW 956-732-3678) Visit the website of the Substance Abuse and [place]: ktimeonline.com Text QUIT to SmokefreeTXT: 500938 Where to find more information Visit these websites to find more information on quitting smoking: [place]: www.smokefree.gov American Lung Association: www.lung.org American Cancer Society: www.cancer.org Centers for Disease Control and Prevention: http://www.wolf.info/ American Heart Association: www.heart.org Contact a health care provider if: You want to change your plan for quitting. The medicines you are taking are not helping. Your eating feels out of control or you cannot sleep. Get  help right away if: You feel depressed or become very anxious. Summary Quitting smoking is a physical and mental challenge. You will face cravings, withdrawal symptoms, and temptation to smoke again. Preparation can help you as you go through these challenges. Try different techniques to manage stress, handle social situations, and prevent weight gain. You can deal with cravings by keeping your mouth busy (such as by chewing gum), keeping your hands and body busy, calling family or friends, or contacting a quitline for people who want to quit smoking. You can deal with withdrawal symptoms by avoiding places where people smoke, getting plenty of rest, and avoiding drinks with caffeine. This information is not intended to replace advice given to you by your health care provider. Make sure you discuss any questions you have with your health care provider. Document Revised: 08/03/2021 Document Reviewed: 09/14/2019 Elsevier Patient Education  2022 [place] Lung cancer is an abnormal growth of cancerous cells that forms a mass (malignant  tumor) in a lung. There are several types of lung cancer. The types are based on the appearance of the tumor cells. The two most common types are: Non-small cell lung cancer. This type of lung cancer is the most common type. Non-small cell lung cancers include squamous cell carcinoma, adenocarcinoma, and large cell carcinoma. Small cell lung cancer. In this type of lung cancer, abnormal cells are smaller than those of non-small cell lung cancer. Small cell lung cancer gets worse (progresses) faster than non-small cell lung cancer. What are the causes? The most common cause of lung cancer is smoking tobacco. The second most common cause is exposure to a chemical called radon. What increases the risk? You are more likely to develop this condition if: You smoke tobacco. You have been exposed to: Secondhand tobacco smoke. Radon  gas. Uranium. Asbestos. Arsenic in drinking water. Air pollution and diesel exhaust. You have a family or personal history of lung cancer. You have had lung radiation therapy in the past. You are older than age 39. What are the signs or symptoms? In the early stages, you may not have any symptoms. As the cancer progresses, symptoms may include: A lasting cough, possibly with blood. Fatigue. Unexplained weight loss. Shortness of breath. High-pitched whistling sounds when you breathe, most often when you breathe out (wheezing). Chest pain. Loss of appetite. Symptoms of advanced lung cancer include: Hoarseness. Bone or joint pain. Weakness. Change in the structure of the fingernails (clubbing), so that the nail looks like an upside-down spoon. Swelling of the face or arms. Inability to move the face (paralysis). Drooping eyelids. How is this diagnosed? This condition may be diagnosed based on: Your symptoms and medical history. A physical exam. A chest X-ray. A CT scan. Blood tests. Sputum tests. Removal of a sample of lung tissue (lung biopsy) for testing. Your cancer will be assessed (staged) to determine how severe it is and how much it has spread (metastasized). How is this treated? Treatment depends on the type and stage of your cancer. Treatment may include one or more of the following: Surgery to remove as much of the cancer as possible. Lymph nodes in the area may be removed and tested for cancer as well. Medicines that kill cancer cells (chemotherapy). High-energy rays that kill cancer cells (radiation therapy). Targeted therapy. This targets specific parts of cancer cells and the area around them to block the growth and spread of the cancer. Targeted therapy can help limit the damage to healthy cells. Immunotherapy. This treatment uses a person's own immune system to fight cancer by either boosting the immune system or changing how the immune system works. Follow  these instructions at home:  Do not use any products that contain nicotine or tobacco. These products include cigarettes, chewing tobacco, and vaping devices, such as e-cigarettes. If you need help quitting, ask your health care provider. Do not drink alcohol. If you are admitted to the hospital, make sure your cancer specialist (oncologist) is aware. Your cancer may affect your treatment for other conditions. Take over-the-counter and prescription medicines only as told by your health care provider. Work with your health care provider to manage any side effects of treatment. Keep all follow-up visits. This is important. Where to find support Consider joining a local support group for people who have been diagnosed with lung cancer. Where to find more information American Cancer Society: www.cancer.[place]): www.cancer.gov Contact a health care provider if you: Lose weight without trying. Have a persistent  cough and wheezing. Feel short of breath. Get tired easily. Have bone or joint pain. Have difficulty swallowing. Notice that your voice is changing or getting hoarse. Have pain that does not get better with medicine. Get help right away if you: Cough up blood. Have chest pain or new breathing problems. Have a fever. Have swelling in an ankle, leg, or arm, or the face or neck. Have paralysis in your face. Are very confused. Have a drooping eyelid. These symptoms may represent a serious problem that is an emergency. Do not wait to see if the symptoms will go away. Get medical help right away. Call your local emergency services (911 in the [place] Do not drive yourself to the hospital. Summary Lung cancer is an abnormal growth of cancerous cells that forms a mass (malignant tumor) in a lung. There are several types of lung cancer. The types are based on the appearance of the tumor cells. The two most common types are non-small cell and small cell. The most  common cause of lung cancer is smoking tobacco. Early symptoms include a lasting cough, possibly with blood, and fatigue, unexplained weight loss, and shortness of breath. After diagnosis, treatment depends on the type and stage of your cancer. This information is not intended to replace advice given to you by your health care provider. Make sure you discuss any questions you have with your health care provider. Document Revised: 05/16/2021 Document Reviewed: 05/16/2021 Elsevier Patient Education  [place].

## 2022-01-17 NOTE — Progress Notes (Signed)
Oncology Nurse Navigator Documentation  Oncology Nurse Navigator Flowsheets 01/17/2022 01/16/2022 01/11/2022  Abnormal Finding Date - - 11/16/2021  Confirmed Diagnosis Date - - 01/11/2022  Diagnosis Status - - Pathology Pending  Planned Course of Treatment Chemo/Radiation Concurrent - -  Phase of Treatment Chemo - -  Navigator Follow Up Date: 01/21/2022 - 01/17/2022  Navigator Follow Up Reason: Appointment Review - New Patient Appointment  Navigator Location [place]  Referral Date to RadOnc/MedOnc - - 01/11/2022  Navigator Encounter Type Clinic/MDC;Initial MedOnc Other: Telephone  Telephone - - Outgoing Call  Patient Visit Type Initial;MedOnc - Initial  Treatment Phase Pre-Tx/Tx Discussion Pre-Tx/Tx Discussion Abnormal Scans  Barriers/Navigation Needs Education/I met [name] today. Very nice couple. I help to explain treatment plan and upcoming appts.  I also added information to AVS.  Coordination of Care Education;Coordination of Care  Education Newly Diagnosed Cancer Education;Understanding Cancer/ Treatment Options;Other - Other  Interventions Education;Psycho-Social Support Coordination of Care Coordination of Care;Education;Psycho-Social Support  Acuity Level 3-Moderate Needs (3-4 Barriers Identified) Level 2-Minimal Needs (1-2 Barriers Identified) Level 2-Minimal Needs (1-2 Barriers Identified)  Coordination of Care - Pathology Appts  Education Method Verbal;Other - Verbal  Time Spent with Patient 76 14 70

## 2022-01-17 NOTE — Progress Notes (Signed)

## 2022-01-18 ENCOUNTER — Encounter: Payer: Self-pay | Admitting: Acute Care

## 2022-01-18 ENCOUNTER — Ambulatory Visit (INDEPENDENT_AMBULATORY_CARE_PROVIDER_SITE_OTHER): Payer: PPO | Admitting: Acute Care

## 2022-01-18 ENCOUNTER — Telehealth: Payer: Self-pay | Admitting: Acute Care

## 2022-01-18 ENCOUNTER — Ambulatory Visit (INDEPENDENT_AMBULATORY_CARE_PROVIDER_SITE_OTHER): Payer: PPO

## 2022-01-18 ENCOUNTER — Encounter: Payer: Self-pay | Admitting: *Deleted

## 2022-01-18 ENCOUNTER — Other Ambulatory Visit: Payer: Self-pay

## 2022-01-18 ENCOUNTER — Telehealth: Payer: Self-pay | Admitting: Internal Medicine

## 2022-01-18 VITALS — BP 124/74 | HR 92 | Temp 98.1°F | Ht 68.0 in | Wt 162.0 lb

## 2022-01-18 DIAGNOSIS — R059 Cough, unspecified: Secondary | ICD-10-CM

## 2022-01-18 DIAGNOSIS — R0600 Dyspnea, unspecified: Secondary | ICD-10-CM

## 2022-01-18 DIAGNOSIS — R918 Other nonspecific abnormal finding of lung field: Secondary | ICD-10-CM | POA: Diagnosis not present

## 2022-01-18 MED ORDER — ALBUTEROL SULFATE HFA 108 (90 BASE) MCG/ACT IN AERS: 2.0000 | INHALATION_SPRAY | Freq: Four times a day (QID) | RESPIRATORY_TRACT | 6 refills | Status: DC | PRN

## 2022-01-18 NOTE — Progress Notes (Signed)
History of Present Illness Peter Diaz is a 71 y.o. male current every day smoker with new diagnosis of invasive moderately differentiated squamous cell carcinoma.He underwent robotic assisted  biopsy by Dr. Valeta Harms on 01/11/2022.    Synopsis Peter Diaz is a 71 y.o. male with past medical history significant for hypertension, GERD , asthma as a child, as well as long history of smoking.  The patient was seen by his primary care physician Dr. Kenton Kingfisher in early November 2022 complaining of acute cough.  He had chest x-ray at that time on October 11, 2021 and that showed suspicious left lower lobe pneumonia.  He was treated with a course of antibiotics.  Few weeks later he had repeat chest x-ray on November 16, 2021 and that showed persistent airspace opacity in the left perihilar lung extending into the lung and associated with soft tissue prominence in the left hilum and this finding are suspicious for unresolved pneumonia or potential underlying malignancy.  He had CT scan of the chest with contrast on December 14, 2020 and that showed 7.0 x 5.3 cm necrotic left upper lobe mass which extended into the left hilar region and highly concerning for malignancy.  There was also left hilar and subcarinal adenopathy concerning for metastatic disease.  A PET scan was performed on January 08, 2022 and that showed the left hilar mass identified on the previous CT scan extending from the left hilum in the left upper lobe along the major fissure and distorting the major fissure with extension directly contiguous with the left hilum measuring 7.5 x 4.5 cm with SUV max of 24.1.  There was also cystic and cavitary changes noted in the left lower lobe just posterior to the major fissure also with increased metabolic activity with maximum SUV of 7.1 and this area measuring 2.5 x 1.7 cm.  The scan also showed left perihilar adenopathy measuring 1.3 cm with evidence of increased metabolic activity.  There was also subcarinal  adenopathy measuring 1.2 cm with maximum SUV of 5.6.  There was also small left sided effusion. The patient was referred to Dr. Valeta Harms and on February 32,023 he underwent video bronchoscopy with EBUS and biopsy of the left upper lobe as well as the lymphadenopathy.  The final pathology ([id number]) showed invasive moderately differentiated squamous cell carcinoma.   01/18/2022 Pt. Presents for follow up after robotic assisted bronchoscopy and biopsy.He has done well after the bronchoscopy. No hemoptysis. He does states he has a cough which his wife states he had prior to the procedure. He denies a sore throat after the procedure. No fever or change in secretions. Bioppsy was + for He has been referred to medical oncology as well as radiation oncology.  He has an MRI scheduled ( brain) , and has been seen by medical oncology [facility]) , and is scheduled to see Dr. Sondra Come from rad onc next week.  He is working on quitting smoking. He is making progress. We discussed that this is important. He verbalized understanding. We discussed where he can get free nicotine gum or mints.   Test Results: Cytology Clinical History: None provided  Specimen Submitted:  B. LUNG, ENDOBRONCHIAL, BRUSHING:    FINAL MICROSCOPIC DIAGNOSIS:  - Suspicious for malignancy  - Rare atypical large cells suspicious for tumor   Specimen Submitted:  A. LYMPH NODE, 7 NODE, FINE NEEDLE ASPIRATION:    FINAL MICROSCOPIC DIAGNOSIS:  - Suspicious for malignancy  - Rare atypical large cells suspicious for tumor  SURGICAL PATHOLOGY  CASE: [id number]  PATIENT: Peter Diaz  Surgical Pathology Report   Clinical History: left lung mass (cm)   FINAL MICROSCOPIC DIAGNOSIS:   A. LUNG, LEFT UPPER LOBE, ENDOBRONCHIAL BIOPSY:  Invasive moderately differentiated squamous cell carcinoma (see comment)   CBC Latest Ref Rng & Units 01/17/2022 01/11/2022 01/11/2022  WBC 4.0 - 10.5 K/uL 10.7(H) - 11.1(H)  Hemoglobin 13.0 - 17.0 g/dL  13.3 13.9 14.2  Hematocrit 39.0 - 52.0 % 42.4 41.0 44.9  Platelets 150 - 400 K/uL 463(H) - 490(H)    BMP Latest Ref Rng & Units 01/17/2022 01/11/2022  Glucose 70 - 99 mg/dL 95 100(H)  BUN 8 - 23 mg/dL 13 17  Creatinine 0.61 - 1.24 mg/dL 1.13 1.30(H)  Sodium 135 - 145 mmol/L 138 139  Potassium 3.5 - 5.1 mmol/L 4.0 4.2  Chloride 98 - 111 mmol/L 103 105  CO2 22 - 32 mmol/L 28 -  Calcium 8.9 - 10.3 mg/dL 9.1 -    BNP No results found for: BNP  ProBNP No results found for: PROBNP  PFT No results found for: FEV1PRE, FEV1POST, FVCPRE, FVCPOST, TLC, DLCOUNC, PREFEV1FVCRT, PSTFEV1FVCRT  NM PET Image Initial (PI) Skull Base To Thigh (F-18 FDG)  Result Date: 01/09/2022 CLINICAL DATA:  Initial treatment strategy for suspected bronchogenic neoplasm, non-small cell lung cancer with upcoming bronchoscopy for LEFT upper lobe mass. EXAM: NUCLEAR MEDICINE PET SKULL BASE TO THIGH TECHNIQUE: 8.3 mCi F-18 FDG was injected intravenously. Full-ring PET imaging was performed from the skull base to thigh after the radiotracer. CT data was obtained and used for attenuation correction and anatomic localization. Fasting blood glucose: 102 mg/dl COMPARISON:  Comparison is made with December 14, 2021 chest CT. FINDINGS: Mediastinal blood pool activity: SUV max 2.44 Liver activity: SUV max NA NECK: No hypermetabolic lymph nodes in the neck. Incidental CT findings: Hypoattenuation in the LEFT frontal region corresponding diminished FDG uptake in this area compatible with encephalomalacia in this patient with prior "brain surgery" and reported prior abscesses in the LEFT frontal lobe, this area is incompletely imaged and without comparison aside from prior reports. Complete opacification of the RIGHT maxillary sinus compatible with chronic sinusitis. Signs of sinus disease with partial opacification of the LEFT maxillary sinus and scattered opacification of ethmoid sinuses as well as the RIGHT frontal sinus. CHEST: The LEFT  hilar mass that was identified on the recent chest CT extending from the LEFT hilum in the LEFT upper lobe along the major fissure, distorting the major fissure and with extension also, directly contiguous with the LEFT hilum is. This measures 7.5 x 4.5 cm greatest axial dimension and when measured in a similar fashion on the prior study measured approximately 7.2 x 4.7 cm. Maximum SUV in this area is 24.1. Cystic and or cavitary changes are also noted in the LEFT lower lobe just posterior to the major fissure also with increased metabolic activity with a maximum SUV of 7.1 (image 95/4) this area measuring approximately 2.5 x 1.7 cm. LEFT perihilar adenopathy (image 76/4) 13 mm short axis, in nearly contiguous with the dominant mass and with evidence of increased metabolic activity with similar FDG uptake. Subcarinal adenopathy (image 80/4) 12 mm with a maximum SUV of 5.6. No additional areas of increased metabolic activity in the chest. Incidental CT findings: Background pulmonary emphysema. Narrowing of airways at the LEFT hilum secondary to LEFT hilar mass. Small LEFT-sided pleural effusion layers dependently without associated increased metabolic activity with focal characteristics at this time. Calcified aortic atherosclerosis.  Mass also abuts the superior aspect of the LEFT heart border but there is no pericardial effusion. The heart size is normal and there is calcified coronary artery disease. Limited assessment of cardiovascular structures given lack of intravenous contrast. ABDOMEN/PELVIS: No abnormal hypermetabolic activity within the liver, pancreas, adrenal glands, or spleen. No hypermetabolic lymph nodes in the abdomen or pelvis. Incidental CT findings: Cholelithiasis. No acute findings relative to the liver, gallbladder, pancreas, spleen, adrenal glands or kidneys. No acute gastrointestinal process. Aortic atherosclerosis without aneurysm. Prostatomegaly. SKELETON: No focal hypermetabolic activity to  suggest skeletal metastasis. Incidental CT findings: Spinal degenerative changes. IMPRESSION: Cavitary hypermetabolic LEFT hilar mass inseparable from the LEFT hilum and tracking along the major fissure into the peripheral posterior LEFT upper lobe, associated with subcarinal and LEFT hilar adenopathy with hypermetabolic features, highly suspicious for bronchogenic neoplasm with associated mediastinal adenopathy. Increased metabolic activity also noted in the adjacent LEFT lower lobe favored to represent dilated bronchial structures with postobstructive changes. Additional site of disease not entirely excluded at this time and associated with increased metabolic activity as above. Small LEFT-sided effusion. Calcified atherosclerosis of the thoracic and abdominal aorta, calcified coronary artery disease and pulmonary emphysema. Cholelithiasis. Extensive sinus disease worse in the RIGHT maxillary sinus. Correlate with any symptoms of chronic sinusitis. Incidental findings in the brain likely related to prior is surgery about the LEFT frontal lobe that is reported in the patient's history and made reference to on prior imaging reports. Aortic Atherosclerosis (ICD10-I70.0) and Emphysema (ICD10-J43.9). Electronically Signed   By: Zetta Bills M.D.   On: 01/09/2022 08:28     Past medical hx Past Medical History:  Diagnosis Date   GERD (gastroesophageal reflux disease)    Gout    HTN (hypertension)      Social History   Tobacco Use   Smoking status: Some Days    Packs/day: 0.80    Types: Cigarettes   Smokeless tobacco: Never   Tobacco comments:    Down to 1 cigarette every 2 days  Vaping Use   Vaping Use: Never used  Substance Use Topics   Alcohol use: Yes   Drug use: Never    Mr.Cumberledge reports that he has been smoking cigarettes. He has been smoking an average of .8 packs per day. He has never used smokeless tobacco. He reports current alcohol use. He reports that he does not use  drugs.  Tobacco Cessation: Still smoking 1 cigarette every 2 days, working on quitting completely  Past surgical hx, Family hx, Social hx all reviewed.  Current Outpatient Medications on File Prior to Visit  Medication Sig   amLODipine (NORVASC) 10 MG tablet Take 10 mg by mouth daily.   esomeprazole (NEXIUM) 20 MG capsule Take 20 mg by mouth daily at 12 noon.   prochlorperazine (COMPAZINE) 10 MG tablet Take 1 tablet (10 mg total) by mouth every 6 (six) hours as needed for nausea or vomiting.   tamsulosin (FLOMAX) 0.4 MG CAPS capsule Take 0.4 mg by mouth at bedtime.   No current facility-administered medications on file prior to visit.     No Known Allergies  Review Of Systems:  Constitutional:   No  weight loss, night sweats,  Fevers, chills, fatigue, or  lassitude.  HEENT:   No headaches,  Difficulty swallowing,  Tooth/dental problems, or  Sore throat,                No sneezing, itching, ear ache, nasal congestion, post nasal drip,   CV:  No  chest pain,  Orthopnea, PND, swelling in lower extremities, anasarca, dizziness, palpitations, syncope.   GI  No heartburn, indigestion, abdominal pain, nausea, vomiting, diarrhea, change in bowel habits, loss of appetite, bloody stools.   Resp: + shortness of breath with exertion not  at rest.  No excess mucus, no productive cough,  + non-productive cough,  No coughing up of blood.  No change in color of mucus.  No wheezing.  No chest wall deformity  Skin: no rash or lesions.  GU: no dysuria, change in color of urine, no urgency or frequency.  No flank pain, no hematuria   MS:  No joint pain or swelling.  No decreased range of motion.  No back pain.  Psych:  No change in mood or affect. No depression or anxiety.  No memory loss.   Vital Signs BP 124/74 (BP Location: Right Arm, Cuff Size: Normal)    Pulse 92    Temp 98.1 F (36.7 C) (Oral)    Ht 5\' 8"  (1.727 m)    Wt 162 lb (73.5 kg)    SpO2 96%    BMI 24.63 kg/m    Physical  Exam:  General- No distress,  A&Ox3, pleasant ENT: No sinus tenderness, TM clear, pale nasal mucosa, no oral exudate,no post nasal drip, no LAN Cardiac: S1, S2, regular rate and rhythm, no murmur Chest: No wheeze/ rales/ dullness; no accessory muscle use, no nasal flaring, no sternal retractions, coarse throughout, diminished per bases Abd.: Soft Non-tender, ND, BS + Body mass index is 24.63 kg/m.  Ext: No clubbing cyanosis, edema Neuro:  normal strength, MAE x 4, A&O x 3, appropriate Skin: No rashes, warm and dry, no lesions Psych: normal mood and behavior   Assessment/Plan New diagnosis of invasive moderately differentiated squamous cell carcinoma per robotic assisted bronchoscopy and biopsy Needs work up for COPD once chemo and radiation are completed.  Plan We will  do a CXR today  as you have a new cough We will call you with the results Continue to work on quitting smoking  Please call 1-800-QUIT NOW for free nicotine gum or mints.  Follow up in 6 months or as needed  with Dr. Valeta Harms or Judson Roch  NP to be evaluated for COPD with PFT's  Follow up with Dr. Earlie Server and Dr. Sondra Come as is scheduled. Emmit Alexanders with treatment Remember to be very diligent with signs and symptoms of infection and call to be seen at earliest signs. We will send in a prescription for albuterol rescue inhaler. Use this for shortness of breath or wheezing, 1-2 puffs up to twice daily. If you need more frequently, please call to be seen.  Please contact office for sooner follow up if symptoms do not improve or worsen or seek emergency care     CXR done in the office today does not show any new acute issues , just the cancer , which is most likely the cause of his cough. This will be called to the patient.   I spent 40 minutes dedicated to the care of this patient on the date of this encounter to include pre-visit review of records, face-to-face time with the patient discussing conditions above, post visit  ordering of testing, clinical documentation with the electronic health record, making appropriate referrals as documented, and communicating necessary information to the patient's healthcare team.    Magdalen Spatz, NP 01/18/2022  10:45 AM

## 2022-01-18 NOTE — Patient Instructions (Addendum)
It is good to see you today. We will  do a CXR today  We will call you with the results Continue to work on quitting smoking  Please call 1-800-QUIT NOW for free nicotine gum or mints.  Follow up in 6 months or as needed  with Dr. Valeta Harms or Judson Roch  NP to be evaluated for COPD with PFT's  Follow up with Dr. Earlie Server and Dr. Sondra Come as is scheduled. Emmit Alexanders with treatment Remember to be very diligent with signs and symptoms of infection and call to be seen at earliest signs. We will send in a prescription for albuterol rescue inhaler. Use this for shortness of breath or wheezing, 1-2 puffs up to twice daily. If you need more frequently, please call to be seen.  Please contact office for sooner follow up if symptoms do not improve or worsen or seek emergency care

## 2022-01-18 NOTE — Progress Notes (Signed)
Oncology Nurse Navigator Documentation  Oncology Nurse Navigator Flowsheets 01/18/2022 01/17/2022 01/16/2022 01/11/2022  Abnormal Finding Date - - - 11/16/2021  Confirmed Diagnosis Date - - - 01/11/2022  Diagnosis Status - - - Pathology Pending  Planned Course of Treatment - Chemo/Radiation Concurrent - -  Phase of Treatment - Chemo - -  Navigator Follow Up Date: - 01/21/2022 - 01/17/2022  Navigator Follow Up Reason: - Appointment Review - New Patient Appointment  Navigator Location [place]  Referral Date to RadOnc/MedOnc - - - 01/11/2022  Navigator Encounter Type Appt/Treatment Plan Review Clinic/MDC;Initial MedOnc Other: Telephone  Telephone - - - Outgoing Call  Patient Visit Type Other Initial;MedOnc - Initial  Treatment Phase Pre-Tx/Tx Discussion Pre-Tx/Tx Discussion Pre-Tx/Tx Discussion Abnormal Scans  Barriers/Navigation Needs Coordination of Care Education Coordination of Care Education;Coordination of Care  Education - Newly Diagnosed Cancer Education;Understanding Cancer/ Treatment Options;Other - Other  Interventions Coordination of Care/I followed up on Peter Diaz schedule.  He is set up for his tx plan at this time.  Education;Psycho-Social Support Coordination of Care Coordination of Care;Education;Psycho-Social Support  Acuity Level 2-Minimal Needs (1-2 Barriers Identified) Level 3-Moderate Needs (3-4 Barriers Identified) Level 2-Minimal Needs (1-2 Barriers Identified) Level 2-Minimal Needs (1-2 Barriers Identified)  Coordination of Care Other - Pathology Appts  Education Method - Verbal;Other - Verbal  Time Spent with Patient 30 30 15  45

## 2022-01-18 NOTE — Telephone Encounter (Signed)
Scheduled per los, patient has been called and notified. 

## 2022-01-18 NOTE — Telephone Encounter (Signed)
Please let patient know CXR done today in the office does not show any new acute issues.Thanks so much

## 2022-01-19 NOTE — Progress Notes (Signed)
Radiation Oncology         [phone] ________________________________  Initial Outpatient Consultation  Name: Peter Diaz MRN: [id number]  Date: 01/21/2022  DOB: 1951-03-18  BJ:SEGBTD, Gwyndolyn Saxon, MD  Garner Nash, DO   REFERRING PHYSICIAN: Garner Nash, DO  DIAGNOSIS: The encounter diagnosis was Squamous cell carcinoma of bronchus in left lower lobe ([place]).  Stage IIIB (cT4, cN2, cM0) Invasive moderately differentiated squamous cell carcinoma of the LUL  HISTORY OF PRESENT ILLNESS::Peter Diaz is a 71 y.o. male who is accompanied by his wife. he is seen as a courtesy of Dr. Valeta Harms for an opinion concerning radiation therapy as part of management for his recently diagnosed left lung cancer.   The patient was seen by his PCP, Dr. Kenton Kingfisher, in early November 2022 for evaluation of of acute cough.  Chest x-ray on 10/11/21 demonstrated a suspicious left lower lobe pneumonia and he was treated with a course of antibiotics.  A few weeks later, he had repeat chest x-ray on 11/16/21 which showed a persistent airspace opacity in the left perihilar lung extending into the lung, and associated an soft tissue prominence in the left hilum suspicious for unresolved pneumonia vs potential underlying malignancy.   Ultimately, he underwent follow up imaging via chest CT on 12/14/21 which demonstrated a 7 x 5.3 necrotic left upper lobe mass with associated subcarinal adenopathy concerning for a advanced age bronchogenic carcinoma.   Subsequently, the patient was referred to Dr. Valeta Harms on 12/31/21 to discuss tissue sampling. PET ordered by Dr. Valeta Harms for further evaluation on 01/08/22 again showed the left hilar mass extending from the left hilum in the left upper lobe along the major fissure. The mass appeared to distort the major fissure and extend contiguously with the left hilum, measuring 7.5 x 4.5 cm (SUV max of 24.1).  Cystic and cavitary changes were also appreciated in the left lower lobe just  posterior to the major fissure, with increased metabolic activity and a maximum SUV of 7.1, measuring 2.5 x 1.7 cm.  PET also showed left perihilar adenopathy measuring 1.3 cm with evidence of increased metabolic activity, subcarinal adenopathy measuring 1.2 cm with maximum SUV of 5.6, and a small left sided effusion.  The patient opted to proceed with bronchoscopy and LUL endobrachial biopsy on 01/11/22. Pathology revealed invasive moderately differentiated squamous cell carcinoma. FNA of lymph node 7 also performed revealed rare atypical large cells suspicious for malignancy.   Accordingly, the patient was referred to Dr. Julien Nordmann on 01/17/22 to discuss treatment options. During this visit, the patient reported feeling well other than cough. He also reported around a 30 pound weight loss in the last few tears. Otherwise, he denied any CP, SOB, hemoptysis, nausea, emesis, diarrhea, constipation, headaches, visual changes, or fevers. Following discussion of the risks and benefits, Dr. Julien Nordmann ultimately recommended a course of concurrent chemoradiation with weekly carboplatin and paclitaxel for 6-7 weeks, followed by consolidation treatment with immunotherapy with Imfinzi (if the patient has no evidence for disease progression after the induction phase). First dose expected on 01/28/22.   The patient is scheduled for brain MRI on 01/22/22. Per Dr. Julien Nordmann, If MRI shows evidence of metastatic disease to the brain, his treatment plan will likely shift towards more systemic therapy.   Of note: The patient presented to pulmonology, Eric Form FNP, on 01/18/22, with complaints of ongoing cough (present prior to bronchoscopy). Subsequently, a CXR was performed, though results are pending at this time.    PREVIOUS RADIATION THERAPY:  No  PAST MEDICAL HISTORY:  Past Medical History:  Diagnosis Date   GERD (gastroesophageal reflux disease)    Gout    HTN (hypertension)     PAST SURGICAL HISTORY: Past  Surgical History:  Procedure Laterality Date   BIOPSY  01/11/2022   Procedure: BIOPSY;  Surgeon: Garner Nash, DO;  Location: [facility];  Service: Pulmonary;;   BRAIN SURGERY     BRONCHIAL BRUSHINGS  01/11/2022   Procedure: BRONCHIAL BRUSHINGS;  Surgeon: Garner Nash, DO;  Location: [facility];  Service: Pulmonary;;   BRONCHIAL NEEDLE ASPIRATION BIOPSY  01/11/2022   Procedure: BRONCHIAL NEEDLE ASPIRATION BIOPSIES;  Surgeon: Garner Nash, DO;  Location: [facility];  Service: Pulmonary;;   VIDEO BRONCHOSCOPY WITH ENDOBRONCHIAL ULTRASOUND Left 01/11/2022   Procedure: VIDEO BRONCHOSCOPY WITH ENDOBRONCHIAL ULTRASOUND;  Surgeon: Garner Nash, DO;  Location: [facility];  Service: Pulmonary;  Laterality: Left;    FAMILY HISTORY: Father had cancer and died when the patient was young.  He also has a sister with lung cancer. Family History  Problem Relation Age of Onset   Stroke Mother    Stroke Father    Cancer Sister     SOCIAL HISTORY: He has a history of smoking up to 2 packs/day for around 55 years and unfortunately he continues to smoke few cigarettes every day.  He also drinks 12 pack of beer every week.  He has no history of drug abuse. Social History   Tobacco Use   Smoking status: Some Days    Packs/day: 0.80    Types: Cigarettes   Smokeless tobacco: Never   Tobacco comments:    Down to 1 cigarette every 2 days  Vaping Use   Vaping Use: Never used  Substance Use Topics   Alcohol use: Yes   Drug use: Never    ALLERGIES: No Known Allergies  MEDICATIONS:  Current Outpatient Medications  Medication Sig Dispense Refill   amLODipine (NORVASC) 10 MG tablet Take 10 mg by mouth daily.     esomeprazole (NEXIUM) 20 MG capsule Take 20 mg by mouth daily at 12 noon.     ibuprofen (ADVIL) 200 MG tablet Take 200 mg by mouth as needed.     tamsulosin (FLOMAX) 0.4 MG CAPS capsule Take 0.4 mg by mouth at bedtime.     albuterol (VENTOLIN HFA) 108 (90 Base) MCG/ACT inhaler  Inhale 2 puffs into the lungs every 6 (six) hours as needed for wheezing or shortness of breath. (Patient not taking: Reported on 01/21/2022) 8 g 6   prochlorperazine (COMPAZINE) 10 MG tablet Take 1 tablet (10 mg total) by mouth every 6 (six) hours as needed for nausea or vomiting. (Patient not taking: Reported on 01/21/2022) 30 tablet 0   No current facility-administered medications for this encounter.    REVIEW OF SYSTEMS:  A 10+ POINT REVIEW OF SYSTEMS WAS OBTAINED including neurology, dermatology, psychiatry, cardiac, respiratory, lymph, extremities, GI, GU, musculoskeletal, constitutional, reproductive, HEENT.  He denies any significant pain within the chest area significant cough or hemoptysis.  He has some dyspnea with exertion.  He denies any headaches or blurry vision or double vision.   PHYSICAL EXAM:  height is 5\' 8"  (1.727 m) and weight is 162 lb (73.5 kg). His temperature is 97.2 F (36.2 C) (abnormal). His blood pressure is 131/71 and his pulse is 105 (abnormal). His respiration is 20 and oxygen saturation is 99%.   General: Alert and oriented, in no acute distress HEENT: Head is normocephalic. Extraocular  movements are intact. Oropharynx is clear. Neck: Neck is supple, no palpable cervical or supraclavicular lymphadenopathy. Heart: Regular in rate and rhythm with no murmurs, rubs, or gallops. Chest: Clear to auscultation bilaterally, with no rhonchi, wheezes, or rales. Abdomen: Soft, nontender, nondistended, with no rigidity or guarding. Extremities: No cyanosis or edema. Lymphatics: see Neck Exam Skin: No concerning lesions. Musculoskeletal: symmetric strength and muscle tone throughout. Neurologic: Cranial nerves II through XII are grossly intact. No obvious focalities. Speech is fluent. Coordination is intact. Psychiatric: Judgment and insight are intact. Affect is appropriate.   ECOG = 1  0 - Asymptomatic (Fully active, able to carry on all predisease activities without  restriction)  1 - Symptomatic but completely ambulatory (Restricted in physically strenuous activity but ambulatory and able to carry out work of a light or sedentary nature. For example, light housework, office work)  2 - Symptomatic, <50% in bed during the day (Ambulatory and capable of all self care but unable to carry out any work activities. Up and about more than 50% of waking hours)  3 - Symptomatic, >50% in bed, but not bedbound (Capable of only limited self-care, confined to bed or chair 50% or more of waking hours)  4 - Bedbound (Completely disabled. Cannot carry on any self-care. Totally confined to bed or chair)  5 - Death   Eustace Pen MM, Creech RH, Tormey DC, et al. [phone]). "Toxicity and response criteria of the [facility]". [place] Oncol. 5 (6): 649-55  LABORATORY DATA:  Lab Results  Component Value Date   WBC 10.7 (H) 01/17/2022   HGB 13.3 01/17/2022   HCT 42.4 01/17/2022   MCV 86.5 01/17/2022   PLT 463 (H) 01/17/2022   NEUTROABS 7.5 01/17/2022   Lab Results  Component Value Date   NA 138 01/17/2022   K 4.0 01/17/2022   CL 103 01/17/2022   CO2 28 01/17/2022   GLUCOSE 95 01/17/2022   CREATININE 1.13 01/17/2022   CALCIUM 9.1 01/17/2022      RADIOGRAPHY: DG Chest 2 View  Result Date: 01/20/2022 CLINICAL DATA:  71 year old male with history of pulmonary mass. EXAM: CHEST - 2 VIEW COMPARISON:  Chest x-ray 11/16/2021. FINDINGS: Again noted is a large mass in the perihilar aspect of the left lung, which has some central lucency, compatible with cavitation as better demonstrated on prior CT and PET-CT examinations. Surrounding interstitial prominence is noted throughout the left mid to lower lung. Patchy areas of interstitial prominence are now noted in the right mid to lower lung, new compared to the prior study. No pleural effusions. No pneumothorax. No evidence of pulmonary edema. Heart size is normal. Upper mediastinal contours are within  normal limits. IMPRESSION: 1. Similar appearance of perihilar mass in the left lung. 2. New areas of interstitial prominence throughout the right mid to lower lung, potentially infectious or inflammatory in etiology. Electronically Signed   By: Vinnie Langton M.D.   On: 01/20/2022 13:37   NM PET Image Initial (PI) Skull Base To Thigh (F-18 FDG)  Result Date: 01/09/2022 CLINICAL DATA:  Initial treatment strategy for suspected bronchogenic neoplasm, non-small cell lung cancer with upcoming bronchoscopy for LEFT upper lobe mass. EXAM: NUCLEAR MEDICINE PET SKULL BASE TO THIGH TECHNIQUE: 8.3 mCi F-18 FDG was injected intravenously. Full-ring PET imaging was performed from the skull base to thigh after the radiotracer. CT data was obtained and used for attenuation correction and anatomic localization. Fasting blood glucose: 102 mg/dl COMPARISON:  Comparison is made with December 14, 2021 chest CT. FINDINGS: Mediastinal blood pool activity: SUV max 2.44 Liver activity: SUV max NA NECK: No hypermetabolic lymph nodes in the neck. Incidental CT findings: Hypoattenuation in the LEFT frontal region corresponding diminished FDG uptake in this area compatible with encephalomalacia in this patient with prior "brain surgery" and reported prior abscesses in the LEFT frontal lobe, this area is incompletely imaged and without comparison aside from prior reports. Complete opacification of the RIGHT maxillary sinus compatible with chronic sinusitis. Signs of sinus disease with partial opacification of the LEFT maxillary sinus and scattered opacification of ethmoid sinuses as well as the RIGHT frontal sinus. CHEST: The LEFT hilar mass that was identified on the recent chest CT extending from the LEFT hilum in the LEFT upper lobe along the major fissure, distorting the major fissure and with extension also, directly contiguous with the LEFT hilum is. This measures 7.5 x 4.5 cm greatest axial dimension and when measured in a similar  fashion on the prior study measured approximately 7.2 x 4.7 cm. Maximum SUV in this area is 24.1. Cystic and or cavitary changes are also noted in the LEFT lower lobe just posterior to the major fissure also with increased metabolic activity with a maximum SUV of 7.1 (image 95/4) this area measuring approximately 2.5 x 1.7 cm. LEFT perihilar adenopathy (image 76/4) 13 mm short axis, in nearly contiguous with the dominant mass and with evidence of increased metabolic activity with similar FDG uptake. Subcarinal adenopathy (image 80/4) 12 mm with a maximum SUV of 5.6. No additional areas of increased metabolic activity in the chest. Incidental CT findings: Background pulmonary emphysema. Narrowing of airways at the LEFT hilum secondary to LEFT hilar mass. Small LEFT-sided pleural effusion layers dependently without associated increased metabolic activity with focal characteristics at this time. Calcified aortic atherosclerosis. Mass also abuts the superior aspect of the LEFT heart border but there is no pericardial effusion. The heart size is normal and there is calcified coronary artery disease. Limited assessment of cardiovascular structures given lack of intravenous contrast. ABDOMEN/PELVIS: No abnormal hypermetabolic activity within the liver, pancreas, adrenal glands, or spleen. No hypermetabolic lymph nodes in the abdomen or pelvis. Incidental CT findings: Cholelithiasis. No acute findings relative to the liver, gallbladder, pancreas, spleen, adrenal glands or kidneys. No acute gastrointestinal process. Aortic atherosclerosis without aneurysm. Prostatomegaly. SKELETON: No focal hypermetabolic activity to suggest skeletal metastasis. Incidental CT findings: Spinal degenerative changes. IMPRESSION: Cavitary hypermetabolic LEFT hilar mass inseparable from the LEFT hilum and tracking along the major fissure into the peripheral posterior LEFT upper lobe, associated with subcarinal and LEFT hilar adenopathy with  hypermetabolic features, highly suspicious for bronchogenic neoplasm with associated mediastinal adenopathy. Increased metabolic activity also noted in the adjacent LEFT lower lobe favored to represent dilated bronchial structures with postobstructive changes. Additional site of disease not entirely excluded at this time and associated with increased metabolic activity as above. Small LEFT-sided effusion. Calcified atherosclerosis of the thoracic and abdominal aorta, calcified coronary artery disease and pulmonary emphysema. Cholelithiasis. Extensive sinus disease worse in the RIGHT maxillary sinus. Correlate with any symptoms of chronic sinusitis. Incidental findings in the brain likely related to prior is surgery about the LEFT frontal lobe that is reported in the patient's history and made reference to on prior imaging reports. Aortic Atherosclerosis (ICD10-I70.0) and Emphysema (ICD10-J43.9). Electronically Signed   By: Zetta Bills M.D.   On: 01/09/2022 08:28      IMPRESSION: Stage IIIB (cT4, cN2, cM0) Invasive moderately differentiated squamous cell carcinoma of  the LUL  Pending results of the patient's brain MRI he would be a good candidate for a definitive course of radiation therapy along with radiosensitizing chemotherapy.  Today, I talked to the patient and his wife about the findings and work-up thus far.  We discussed the natural history of non-small cell lung cancer and general treatment, highlighting the role of radiotherapy in the management.  We discussed the available radiation techniques, and focused on the details of logistics and delivery.  We reviewed the anticipated acute and late sequelae associated with radiation in this setting.  The patient was encouraged to ask questions that I answered to the best of my ability.  A patient consent form was discussed and signed.  We retained a copy for our records.  The patient would like to proceed with radiation and will be scheduled for CT  simulation.  PLAN: He will undergo CT simulation later this morning.  Treatments to begin February 20 concomitant with radiosensitizing chemotherapy.  Anticipate approximately 6 weeks of chest radiation therapy.  If brain MRI shows metastatic disease to this area we will rethink his overall treatment plan.   60 minutes of total time was spent for this patient encounter, including preparation, face-to-face counseling with the patient and coordination of care, physical exam, and documentation of the encounter.   ------------------------------------------------  Blair Promise, PhD, MD  This document serves as a record of services personally performed by Gery Pray, MD. It was created on his behalf by Roney Mans, a trained medical scribe. The creation of this record is based on the scribe's personal observations and the provider's statements to them. This document has been checked and approved by the attending provider.

## 2022-01-21 ENCOUNTER — Other Ambulatory Visit: Payer: Self-pay

## 2022-01-21 ENCOUNTER — Ambulatory Visit
Admission: RE | Admit: 2022-01-21 | Discharge: 2022-01-21 | Disposition: A | Discharge status: Home / Self Care | Payer: PPO | Source: Ambulatory Visit | Attending: Radiation Oncology | Admitting: Radiation Oncology

## 2022-01-21 ENCOUNTER — Encounter: Payer: Self-pay | Admitting: Radiation Oncology

## 2022-01-21 VITALS — BP 131/71 | HR 105 | Temp 97.2°F | Resp 20 | Ht 68.0 in | Wt 162.0 lb

## 2022-01-21 DIAGNOSIS — Z809 Family history of malignant neoplasm, unspecified: Secondary | ICD-10-CM | POA: Diagnosis not present

## 2022-01-21 DIAGNOSIS — I2510 Atherosclerotic heart disease of native coronary artery without angina pectoris: Secondary | ICD-10-CM | POA: Diagnosis not present

## 2022-01-21 DIAGNOSIS — C3432 Malignant neoplasm of lower lobe, left bronchus or lung: Secondary | ICD-10-CM

## 2022-01-21 DIAGNOSIS — Z87891 Personal history of nicotine dependence: Secondary | ICD-10-CM | POA: Diagnosis not present

## 2022-01-21 DIAGNOSIS — F17210 Nicotine dependence, cigarettes, uncomplicated: Secondary | ICD-10-CM | POA: Insufficient documentation

## 2022-01-21 DIAGNOSIS — I10 Essential (primary) hypertension: Secondary | ICD-10-CM | POA: Insufficient documentation

## 2022-01-21 DIAGNOSIS — C3412 Malignant neoplasm of upper lobe, left bronchus or lung: Secondary | ICD-10-CM | POA: Diagnosis not present

## 2022-01-21 DIAGNOSIS — Z79899 Other long term (current) drug therapy: Secondary | ICD-10-CM | POA: Insufficient documentation

## 2022-01-21 DIAGNOSIS — M109 Gout, unspecified: Secondary | ICD-10-CM | POA: Diagnosis not present

## 2022-01-21 DIAGNOSIS — K219 Gastro-esophageal reflux disease without esophagitis: Secondary | ICD-10-CM | POA: Diagnosis not present

## 2022-01-21 DIAGNOSIS — Z5111 Encounter for antineoplastic chemotherapy: Secondary | ICD-10-CM | POA: Diagnosis not present

## 2022-01-21 DIAGNOSIS — I700 Atherosclerosis of aorta: Secondary | ICD-10-CM | POA: Insufficient documentation

## 2022-01-21 DIAGNOSIS — J439 Emphysema, unspecified: Secondary | ICD-10-CM | POA: Insufficient documentation

## 2022-01-21 DIAGNOSIS — Z801 Family history of malignant neoplasm of trachea, bronchus and lung: Secondary | ICD-10-CM | POA: Insufficient documentation

## 2022-01-21 DIAGNOSIS — Z510 Encounter for antineoplastic radiation therapy: Secondary | ICD-10-CM | POA: Diagnosis not present

## 2022-01-21 DIAGNOSIS — K8020 Calculus of gallbladder without cholecystitis without obstruction: Secondary | ICD-10-CM | POA: Insufficient documentation

## 2022-01-21 NOTE — Progress Notes (Signed)
See MD note for nursing evaluation. °

## 2022-01-21 NOTE — Progress Notes (Signed)
Pharmacist Chemotherapy Monitoring - Initial Assessment    Anticipated start date: 01/28/22   The following has been reviewed per standard work regarding the patient's treatment regimen: The patient's diagnosis, treatment plan and drug doses, and organ/hematologic function Lab orders and baseline tests specific to treatment regimen  The treatment plan start date, drug sequencing, and pre-medications Prior authorization status  Patient's documented medication list, including drug-drug interaction screen and prescriptions for anti-emetics and supportive care specific to the treatment regimen The drug concentrations, fluid compatibility, administration routes, and timing of the medications to be used The patient's access for treatment and lifetime cumulative dose history, if applicable  The patient's medication allergies and previous infusion related reactions, if applicable   Changes made to treatment plan:  N/A  Follow up needed:  N/A   Larene Beach, RPH, 01/21/2022  2:33 PM

## 2022-01-22 ENCOUNTER — Ambulatory Visit (HOSPITAL_COMMUNITY)
Admission: RE | Admit: 2022-01-22 | Discharge: 2022-01-22 | Disposition: A | Discharge status: Home / Self Care | Payer: PPO | Source: Ambulatory Visit | Attending: Pulmonary Disease | Admitting: Pulmonary Disease

## 2022-01-22 DIAGNOSIS — C3490 Malignant neoplasm of unspecified part of unspecified bronchus or lung: Secondary | ICD-10-CM | POA: Insufficient documentation

## 2022-01-22 DIAGNOSIS — G319 Degenerative disease of nervous system, unspecified: Secondary | ICD-10-CM | POA: Diagnosis not present

## 2022-01-22 MED ORDER — GADOBUTROL 1 MMOL/ML IV SOLN
7.0000 mL | Freq: Once | INTRAVENOUS | Status: AC | PRN
  Administered 2022-01-22: 7 mL via INTRAVENOUS

## 2022-01-22 NOTE — Telephone Encounter (Signed)
I called the patient and he voices understanding. Nothing further needed.

## 2022-01-23 ENCOUNTER — Inpatient Hospital Stay: Payer: PPO

## 2022-01-23 ENCOUNTER — Encounter: Payer: Self-pay | Admitting: *Deleted

## 2022-01-23 ENCOUNTER — Encounter: Payer: Self-pay | Admitting: Internal Medicine

## 2022-01-23 ENCOUNTER — Other Ambulatory Visit: Payer: Self-pay

## 2022-01-23 ENCOUNTER — Telehealth: Payer: Self-pay | Admitting: Pulmonary Disease

## 2022-01-23 NOTE — Progress Notes (Signed)
Oncology Nurse Navigator Documentation  Oncology Nurse Navigator Flowsheets 01/23/2022 01/18/2022 01/17/2022 01/16/2022 01/11/2022  Abnormal Finding Date - - - - 11/16/2021  Confirmed Diagnosis Date - - - - 01/11/2022  Diagnosis Status - - - - Pathology Pending  Planned Course of Treatment - - Chemo/Radiation Concurrent - -  Phase of Treatment Radiation - Chemo - -  Chemotherapy Actual Start Date: 01/28/2022 - - - -  Radiation Actual Start Date: 01/28/2022 - - - -  Navigator Follow Up Date: 02/05/2022 - 01/21/2022 - 01/17/2022  Navigator Follow Up Reason: Follow-up Appointment - Appointment Review - New Patient Appointment  Navigator Location [facility]  Referral Date to RadOnc/MedOnc - - - - 01/11/2022  Navigator Encounter Type Pathology Review Appt/Treatment Plan Review Clinic/MDC;Initial MedOnc Other: Telephone  Telephone - - - - Outgoing Call  Treatment Initiated Date 01/28/2022 - - - -  Patient Visit Type Other Other Initial;MedOnc - Initial  Treatment Phase Pre-Tx/Tx Discussion Pre-Tx/Tx Discussion Pre-Tx/Tx Discussion Pre-Tx/Tx Discussion Abnormal Scans  Barriers/Navigation Needs Coordination of Care/I followed up on molecular and PDL 1 testing on [facility] portal. Molecular test is completed but PDL 1 is pending at this time. Will check back later on PDL 1.  Tx plan is set up at this time.  Coordination of Care Education Coordination of Care Education;Coordination of Care  Education - - Newly Diagnosed Cancer Education;Understanding Cancer/ Treatment Options;Other - Other  Interventions Coordination of Care Coordination of Care Education;Psycho-Social Support Coordination of Care Coordination of Care;Education;Psycho-Social Support  Acuity Level 2-Minimal Needs (1-2 Barriers Identified) Level 2-Minimal Needs (1-2 Barriers Identified) Level 3-Moderate Needs (3-4 Barriers Identified) Level 2-Minimal Needs (1-2 Barriers Identified)  Level 2-Minimal Needs (1-2 Barriers Identified)  Coordination of Care Pathology Other - Pathology Appts  Education Method - - Verbal;Other - Verbal  Time Spent with Patient 30 30 30 15  45

## 2022-01-23 NOTE — Progress Notes (Signed)
Met with patient and spouse at registration to introduce myself as Arboriculturist and to offer available resources.  Discussed one-time $1000 [employer] grant to assist with personal expenses while going through treatment.   Gave them my card if interested in applying and for any additional financial questions or concerns.

## 2022-01-24 DIAGNOSIS — Z510 Encounter for antineoplastic radiation therapy: Secondary | ICD-10-CM | POA: Diagnosis not present

## 2022-01-24 DIAGNOSIS — Z5111 Encounter for antineoplastic chemotherapy: Secondary | ICD-10-CM | POA: Diagnosis not present

## 2022-01-24 DIAGNOSIS — C3412 Malignant neoplasm of upper lobe, left bronchus or lung: Secondary | ICD-10-CM | POA: Diagnosis not present

## 2022-01-24 NOTE — Telephone Encounter (Signed)
Peter Diaz wife checking on MRI results. Peter Diaz phone number is [phone].

## 2022-01-25 ENCOUNTER — Other Ambulatory Visit: Payer: Self-pay

## 2022-01-25 DIAGNOSIS — C3432 Malignant neoplasm of lower lobe, left bronchus or lung: Secondary | ICD-10-CM

## 2022-01-25 MED FILL — Dexamethasone Sodium Phosphate Inj 100 MG/10ML: INTRAMUSCULAR | Qty: 1 | Status: AC

## 2022-01-25 NOTE — Telephone Encounter (Signed)
Dr. Valeta Harms, please advise on the recent MRI results that pt had as his spouse has called the office requesting to know the results. Thanks!

## 2022-01-25 NOTE — Telephone Encounter (Signed)
Icard, Bradley L, DO  You; [facility] Triage Pool 5 minutes ago (12:12 PM)   No evidence of brain metastasis on MRI.     Garner Nash, DO  [facility]  01/25/2022 12:12 PM    Called and spoke with pt's spouse Jackelyn Poling letting her know the results of pt's MRI per [facility] and she verbalized understanding. Nothing further needed.

## 2022-01-28 ENCOUNTER — Inpatient Hospital Stay: Payer: PPO

## 2022-01-28 ENCOUNTER — Ambulatory Visit
Admission: RE | Admit: 2022-01-28 | Discharge: 2022-01-28 | Disposition: A | Discharge status: Home / Self Care | Payer: PPO | Source: Ambulatory Visit | Attending: Radiation Oncology | Admitting: Radiation Oncology

## 2022-01-28 ENCOUNTER — Other Ambulatory Visit: Payer: Self-pay

## 2022-01-28 VITALS — BP 151/87 | HR 81 | Temp 98.0°F | Resp 17 | Wt 164.0 lb

## 2022-01-28 DIAGNOSIS — C3432 Malignant neoplasm of lower lobe, left bronchus or lung: Secondary | ICD-10-CM

## 2022-01-28 DIAGNOSIS — C3412 Malignant neoplasm of upper lobe, left bronchus or lung: Secondary | ICD-10-CM | POA: Diagnosis not present

## 2022-01-28 DIAGNOSIS — Z510 Encounter for antineoplastic radiation therapy: Secondary | ICD-10-CM | POA: Diagnosis not present

## 2022-01-28 DIAGNOSIS — Z5111 Encounter for antineoplastic chemotherapy: Secondary | ICD-10-CM | POA: Diagnosis not present

## 2022-01-28 LAB — CBC WITH DIFFERENTIAL (CANCER CENTER ONLY)
Abs Immature Granulocytes: 0.06 10*3/uL (ref 0.00–0.07)
Basophils Absolute: 0.1 10*3/uL (ref 0.0–0.1)
Basophils Relative: 1 %
Eosinophils Absolute: 0.1 10*3/uL (ref 0.0–0.5)
Eosinophils Relative: 1 %
HCT: 42.1 % (ref 39.0–52.0)
Hemoglobin: 13.2 g/dL (ref 13.0–17.0)
Immature Granulocytes: 1 %
Lymphocytes Relative: 17 %
Lymphs Abs: 1.7 10*3/uL (ref 0.7–4.0)
MCH: 26.6 pg (ref 26.0–34.0)
MCHC: 31.4 g/dL (ref 30.0–36.0)
MCV: 84.7 fL (ref 80.0–100.0)
Monocytes Absolute: 0.8 10*3/uL (ref 0.1–1.0)
Monocytes Relative: 8 %
Neutro Abs: 7.2 10*3/uL (ref 1.7–7.7)
Neutrophils Relative %: 72 %
Platelet Count: 494 10*3/uL — ABNORMAL HIGH (ref 150–400)
RBC: 4.97 MIL/uL (ref 4.22–5.81)
RDW: 13.9 % (ref 11.5–15.5)
WBC Count: 9.9 10*3/uL (ref 4.0–10.5)
nRBC: 0 % (ref 0.0–0.2)

## 2022-01-28 LAB — CMP (CANCER CENTER ONLY)
ALT: 7 U/L (ref 0–44)
AST: 8 U/L — ABNORMAL LOW (ref 15–41)
Albumin: 3.8 g/dL (ref 3.5–5.0)
Alkaline Phosphatase: 102 U/L (ref 38–126)
Anion gap: 7 (ref 5–15)
BUN: 10 mg/dL (ref 8–23)
CO2: 27 mmol/L (ref 22–32)
Calcium: 9.5 mg/dL (ref 8.9–10.3)
Chloride: 105 mmol/L (ref 98–111)
Creatinine: 1.09 mg/dL (ref 0.61–1.24)
GFR, Estimated: >60 mL/min (ref >60)
Glucose, Bld: 94 mg/dL (ref 70–99)
Potassium: 3.9 mmol/L (ref 3.5–5.1)
Sodium: 139 mmol/L (ref 135–145)
Total Bilirubin: 0.3 mg/dL (ref 0.3–1.2)
Total Protein: 7.5 g/dL (ref 6.5–8.1)

## 2022-01-28 MED ORDER — HEPARIN SOD (PORK) LOCK FLUSH 100 UNIT/ML IV SOLN: 500.0000 [IU] | Freq: Once | INTRAVENOUS | Status: DC | PRN

## 2022-01-28 MED ORDER — DIPHENHYDRAMINE HCL 50 MG/ML IJ SOLN
50.0000 mg | Freq: Once | INTRAMUSCULAR | Status: AC
  Administered 2022-01-28: 50 mg via INTRAVENOUS
  Filled 2022-01-28: qty 1

## 2022-01-28 MED ORDER — PALONOSETRON HCL INJECTION 0.25 MG/5ML
0.2500 mg | Freq: Once | INTRAVENOUS | Status: AC
  Administered 2022-01-28: 0.25 mg via INTRAVENOUS
  Filled 2022-01-28: qty 5

## 2022-01-28 MED ORDER — SODIUM CHLORIDE 0.9 % IV SOLN
45.0000 mg/m2 | Freq: Once | INTRAVENOUS | Status: AC
  Administered 2022-01-28: 84 mg via INTRAVENOUS
  Filled 2022-01-28: qty 14

## 2022-01-28 MED ORDER — SODIUM CHLORIDE 0.9% FLUSH: 10.0000 mL | INTRAVENOUS | Status: DC | PRN

## 2022-01-28 MED ORDER — FAMOTIDINE IN NACL 20-0.9 MG/50ML-% IV SOLN
20.0000 mg | Freq: Once | INTRAVENOUS | Status: AC
  Administered 2022-01-28: 20 mg via INTRAVENOUS
  Filled 2022-01-28: qty 50

## 2022-01-28 MED ORDER — SODIUM CHLORIDE 0.9 % IV SOLN
174.4000 mg | Freq: Once | INTRAVENOUS | Status: AC
  Administered 2022-01-28: 170 mg via INTRAVENOUS
  Filled 2022-01-28: qty 17

## 2022-01-28 MED ORDER — SODIUM CHLORIDE 0.9 % IV SOLN: Freq: Once | INTRAVENOUS | Status: AC

## 2022-01-28 MED ORDER — SODIUM CHLORIDE 0.9 % IV SOLN
10.0000 mg | Freq: Once | INTRAVENOUS | Status: AC
  Administered 2022-01-28: 10 mg via INTRAVENOUS
  Filled 2022-01-28: qty 10

## 2022-01-28 NOTE — Patient Instructions (Signed)
[facility]   Discharge Instructions: Thank you for choosing [facility] to provide your oncology and hematology care.   If you have a lab appointment with the [facility], please go directly to the [facility] and check in at the registration area.   Wear comfortable clothing and clothing appropriate for easy access to any Portacath or PICC line.   We strive to give you quality time with your provider. You may need to reschedule your appointment if you arrive late (15 or more minutes).  Arriving late affects you and other patients whose appointments are after yours.  Also, if you miss three or more appointments without notifying the office, you may be dismissed from the clinic at the providers discretion.      For prescription refill requests, have your pharmacy contact our office and allow 72 hours for refills to be completed.    Today you received the following chemotherapy and/or immunotherapy agents: paclitaxel and carboplatin      To help prevent nausea and vomiting after your treatment, we encourage you to take your nausea medication as directed.  BELOW ARE SYMPTOMS THAT SHOULD BE REPORTED IMMEDIATELY: *FEVER GREATER THAN 100.4 F (38 C) OR HIGHER *CHILLS OR SWEATING *NAUSEA AND VOMITING THAT IS NOT CONTROLLED WITH YOUR NAUSEA MEDICATION *UNUSUAL SHORTNESS OF BREATH *UNUSUAL BRUISING OR BLEEDING *URINARY PROBLEMS (pain or burning when urinating, or frequent urination) *BOWEL PROBLEMS (unusual diarrhea, constipation, pain near the anus) TENDERNESS IN MOUTH AND THROAT WITH OR WITHOUT PRESENCE OF ULCERS (sore throat, sores in mouth, or a toothache) UNUSUAL RASH, SWELLING OR PAIN  UNUSUAL VAGINAL DISCHARGE OR ITCHING   Items with * indicate a potential emergency and should be followed up as soon as possible or go to the Emergency Department if any problems should occur.  Please show the CHEMOTHERAPY ALERT CARD or IMMUNOTHERAPY ALERT  CARD at check-in to the Emergency Department and triage nurse.  Should you have questions after your visit or need to cancel or reschedule your appointment, please contact [facility]  Dept: [phone]  and follow the prompts.  Office hours are 8:00 a.m. to 4:30 p.m. Monday - Friday. Please note that voicemails left after 4:00 p.m. may not be returned until the following business day.  We are closed weekends and major holidays. You have access to a nurse at all times for urgent questions. Please call the main number to the clinic Dept: [phone] and follow the prompts.   For any non-urgent questions, you may also contact your provider using MyChart. We now offer e-Visits for anyone 60 and older to request care online for non-urgent symptoms. For details visit [email or web address].   Also download the MyChart app! Go to the app store, search "MyChart", open the app, select [facility], and log in with your MyChart username and password.  Due to Covid, a mask is required upon entering the hospital/clinic. If you do not have a mask, one will be given to you upon arrival. For doctor visits, patients may have 1 support person aged 32 or older with them. For treatment visits, patients cannot have anyone with them due to current Covid guidelines and our immunocompromised population.   Paclitaxel injection What is this medication? PACLITAXEL (PAK li TAX el) is a chemotherapy drug. It targets fast dividing cells, like cancer cells, and causes these cells to die. This medicine is used to treat ovarian cancer, breast cancer, lung cancer, Kaposi's sarcoma, and other cancers.  This medicine may be used for other purposes; ask your health care provider or pharmacist if you have questions. COMMON BRAND NAME(S): Onxol, Taxol What should I tell my care team before I take this medication? They need to know if you have any of these conditions: history of irregular heartbeat liver  disease low blood counts, like low white cell, platelet, or red cell counts lung or breathing disease, like asthma tingling of the fingers or toes, or other nerve disorder an unusual or allergic reaction to paclitaxel, alcohol, polyoxyethylated castor oil, other chemotherapy, other medicines, foods, dyes, or preservatives pregnant or trying to get pregnant breast-feeding How should I use this medication? This drug is given as an infusion into a vein. It is administered in a hospital or clinic by a specially trained health care professional. Talk to your pediatrician regarding the use of this medicine in children. Special care may be needed. Overdosage: If you think you have taken too much of this medicine contact a poison control center or emergency room at once. NOTE: This medicine is only for you. Do not share this medicine with others. What if I miss a dose? It is important not to miss your dose. Call your doctor or health care professional if you are unable to keep an appointment. What may interact with this medication? Do not take this medicine with any of the following medications: live virus vaccines This medicine may also interact with the following medications: antiviral medicines for hepatitis, HIV or AIDS certain antibiotics like erythromycin and clarithromycin certain medicines for fungal infections like ketoconazole and itraconazole certain medicines for seizures like carbamazepine, phenobarbital, phenytoin gemfibrozil nefazodone rifampin St. John's wort This list may not describe all possible interactions. Give your health care provider a list of all the medicines, herbs, non-prescription drugs, or dietary supplements you use. Also tell them if you smoke, drink alcohol, or use illegal drugs. Some items may interact with your medicine. What should I watch for while using this medication? Your condition will be monitored carefully while you are receiving this medicine. You  will need important blood work done while you are taking this medicine. This medicine can cause serious allergic reactions. To reduce your risk you will need to take other medicine(s) before treatment with this medicine. If you experience allergic reactions like skin rash, itching or hives, swelling of the face, lips, or tongue, tell your doctor or health care professional right away. In some cases, you may be given additional medicines to help with side effects. Follow all directions for their use. This drug may make you feel generally unwell. This is not uncommon, as chemotherapy can affect healthy cells as well as cancer cells. Report any side effects. Continue your course of treatment even though you feel ill unless your doctor tells you to stop. Call your doctor or health care professional for advice if you get a fever, chills or sore throat, or other symptoms of a cold or flu. Do not treat yourself. This drug decreases your body's ability to fight infections. Try to avoid being around people who are sick. This medicine may increase your risk to bruise or bleed. Call your doctor or health care professional if you notice any unusual bleeding. Be careful brushing and flossing your teeth or using a toothpick because you may get an infection or bleed more easily. If you have any dental work done, tell your dentist you are receiving this medicine. Avoid taking products that contain aspirin, acetaminophen, ibuprofen, naproxen, or ketoprofen unless  instructed by your doctor. These medicines may hide a fever. Do not become pregnant while taking this medicine. Women should inform their doctor if they wish to become pregnant or think they might be pregnant. There is a potential for serious side effects to an unborn child. Talk to your health care professional or pharmacist for more information. Do not breast-feed an infant while taking this medicine. Men are advised not to father a child while receiving this  medicine. This product may contain alcohol. Ask your pharmacist or healthcare provider if this medicine contains alcohol. Be sure to tell all healthcare providers you are taking this medicine. Certain medicines, like metronidazole and disulfiram, can cause an unpleasant reaction when taken with alcohol. The reaction includes flushing, headache, nausea, vomiting, sweating, and increased thirst. The reaction can last from 30 minutes to several hours. What side effects may I notice from receiving this medication? Side effects that you should report to your doctor or health care professional as soon as possible: allergic reactions like skin rash, itching or hives, swelling of the face, lips, or tongue breathing problems changes in vision fast, irregular heartbeat high or low blood pressure mouth sores pain, tingling, numbness in the hands or feet signs of decreased platelets or bleeding - bruising, pinpoint red spots on the skin, black, tarry stools, blood in the urine signs of decreased red blood cells - unusually weak or tired, feeling faint or lightheaded, falls signs of infection - fever or chills, cough, sore throat, pain or difficulty passing urine signs and symptoms of liver injury like dark yellow or brown urine; general ill feeling or flu-like symptoms; light-colored stools; loss of appetite; nausea; right upper belly pain; unusually weak or tired; yellowing of the eyes or skin swelling of the ankles, feet, hands unusually slow heartbeat Side effects that usually do not require medical attention (report to your doctor or health care professional if they continue or are bothersome): diarrhea hair loss loss of appetite muscle or joint pain nausea, vomiting pain, redness, or irritation at site where injected tiredness This list may not describe all possible side effects. Call your doctor for medical advice about side effects. You may report side effects to FDA at 1-800-FDA-1088. Where  should I keep my medication? This drug is given in a hospital or clinic and will not be stored at home. NOTE: This sheet is a summary. It may not cover all possible information. If you have questions about this medicine, talk to your doctor, pharmacist, or health care provider.  2022 [employer]/Gold Standard (2021-08-14 00:00:00)  Carboplatin injection What is this medication? CARBOPLATIN (KAR boe pla tin) is a chemotherapy drug. It targets fast dividing cells, like cancer cells, and causes these cells to die. This medicine is used to treat ovarian cancer and many other cancers. This medicine may be used for other purposes; ask your health care provider or pharmacist if you have questions. COMMON BRAND NAME(S): Paraplatin What should I tell my care team before I take this medication? They need to know if you have any of these conditions: blood disorders hearing problems kidney disease recent or ongoing radiation therapy an unusual or allergic reaction to carboplatin, cisplatin, other chemotherapy, other medicines, foods, dyes, or preservatives pregnant or trying to get pregnant breast-feeding How should I use this medication? This drug is usually given as an infusion into a vein. It is administered in a hospital or clinic by a specially trained health care professional. Talk to your pediatrician regarding the use of this  medicine in children. Special care may be needed. Overdosage: If you think you have taken too much of this medicine contact a poison control center or emergency room at once. NOTE: This medicine is only for you. Do not share this medicine with others. What if I miss a dose? It is important not to miss a dose. Call your doctor or health care professional if you are unable to keep an appointment. What may interact with this medication? medicines for seizures medicines to increase blood counts like filgrastim, pegfilgrastim, sargramostim some antibiotics like amikacin,  gentamicin, neomycin, streptomycin, tobramycin vaccines Talk to your doctor or health care professional before taking any of these medicines: acetaminophen aspirin ibuprofen ketoprofen naproxen This list may not describe all possible interactions. Give your health care provider a list of all the medicines, herbs, non-prescription drugs, or dietary supplements you use. Also tell them if you smoke, drink alcohol, or use illegal drugs. Some items may interact with your medicine. What should I watch for while using this medication? Your condition will be monitored carefully while you are receiving this medicine. You will need important blood work done while you are taking this medicine. This drug may make you feel generally unwell. This is not uncommon, as chemotherapy can affect healthy cells as well as cancer cells. Report any side effects. Continue your course of treatment even though you feel ill unless your doctor tells you to stop. In some cases, you may be given additional medicines to help with side effects. Follow all directions for their use. Call your doctor or health care professional for advice if you get a fever, chills or sore throat, or other symptoms of a cold or flu. Do not treat yourself. This drug decreases your body's ability to fight infections. Try to avoid being around people who are sick. This medicine may increase your risk to bruise or bleed. Call your doctor or health care professional if you notice any unusual bleeding. Be careful brushing and flossing your teeth or using a toothpick because you may get an infection or bleed more easily. If you have any dental work done, tell your dentist you are receiving this medicine. Avoid taking products that contain aspirin, acetaminophen, ibuprofen, naproxen, or ketoprofen unless instructed by your doctor. These medicines may hide a fever. Do not become pregnant while taking this medicine. Women should inform their doctor if they wish  to become pregnant or think they might be pregnant. There is a potential for serious side effects to an unborn child. Talk to your health care professional or pharmacist for more information. Do not breast-feed an infant while taking this medicine. What side effects may I notice from receiving this medication? Side effects that you should report to your doctor or health care professional as soon as possible: allergic reactions like skin rash, itching or hives, swelling of the face, lips, or tongue signs of infection - fever or chills, cough, sore throat, pain or difficulty passing urine signs of decreased platelets or bleeding - bruising, pinpoint red spots on the skin, black, tarry stools, nosebleeds signs of decreased red blood cells - unusually weak or tired, fainting spells, lightheadedness breathing problems changes in hearing changes in vision chest pain high blood pressure low blood counts - This drug may decrease the number of white blood cells, red blood cells and platelets. You may be at increased risk for infections and bleeding. nausea and vomiting pain, swelling, redness or irritation at the injection site pain, tingling, numbness in the hands  or feet problems with balance, talking, walking trouble passing urine or change in the amount of urine Side effects that usually do not require medical attention (report to your doctor or health care professional if they continue or are bothersome): hair loss loss of appetite metallic taste in the mouth or changes in taste This list may not describe all possible side effects. Call your doctor for medical advice about side effects. You may report side effects to FDA at 1-800-FDA-1088. Where should I keep my medication? This drug is given in a hospital or clinic and will not be stored at home. NOTE: This sheet is a summary. It may not cover all possible information. If you have questions about this medicine, talk to your doctor, pharmacist,  or health care provider.  2022 [employer]/Gold Standard (2008-05-04 00:00:00)

## 2022-01-29 ENCOUNTER — Ambulatory Visit
Admission: RE | Admit: 2022-01-29 | Discharge: 2022-01-29 | Disposition: A | Discharge status: Home / Self Care | Payer: PPO | Source: Ambulatory Visit | Attending: Radiation Oncology | Admitting: Radiation Oncology

## 2022-01-29 DIAGNOSIS — C3412 Malignant neoplasm of upper lobe, left bronchus or lung: Secondary | ICD-10-CM | POA: Diagnosis not present

## 2022-01-29 DIAGNOSIS — Z5111 Encounter for antineoplastic chemotherapy: Secondary | ICD-10-CM | POA: Diagnosis not present

## 2022-01-29 DIAGNOSIS — C3432 Malignant neoplasm of lower lobe, left bronchus or lung: Secondary | ICD-10-CM

## 2022-01-29 DIAGNOSIS — Z510 Encounter for antineoplastic radiation therapy: Secondary | ICD-10-CM | POA: Diagnosis not present

## 2022-01-29 MED ORDER — SONAFINE EX EMUL
1.0000 "application " | Freq: Once | CUTANEOUS | Status: AC
  Administered 2022-01-29: 1 via TOPICAL

## 2022-01-30 ENCOUNTER — Other Ambulatory Visit: Payer: Self-pay

## 2022-01-30 ENCOUNTER — Ambulatory Visit
Admission: RE | Admit: 2022-01-30 | Discharge: 2022-01-30 | Disposition: A | Discharge status: Home / Self Care | Payer: PPO | Source: Ambulatory Visit | Attending: Radiation Oncology | Admitting: Radiation Oncology

## 2022-01-30 DIAGNOSIS — Z5111 Encounter for antineoplastic chemotherapy: Secondary | ICD-10-CM | POA: Diagnosis not present

## 2022-01-30 DIAGNOSIS — C3412 Malignant neoplasm of upper lobe, left bronchus or lung: Secondary | ICD-10-CM | POA: Diagnosis not present

## 2022-01-30 DIAGNOSIS — Z510 Encounter for antineoplastic radiation therapy: Secondary | ICD-10-CM | POA: Diagnosis not present

## 2022-01-31 ENCOUNTER — Ambulatory Visit
Admission: RE | Admit: 2022-01-31 | Discharge: 2022-01-31 | Disposition: A | Discharge status: Home / Self Care | Payer: PPO | Source: Ambulatory Visit | Attending: Radiation Oncology | Admitting: Radiation Oncology

## 2022-01-31 DIAGNOSIS — Z5111 Encounter for antineoplastic chemotherapy: Secondary | ICD-10-CM | POA: Diagnosis not present

## 2022-01-31 DIAGNOSIS — C3412 Malignant neoplasm of upper lobe, left bronchus or lung: Secondary | ICD-10-CM | POA: Diagnosis not present

## 2022-01-31 DIAGNOSIS — Z510 Encounter for antineoplastic radiation therapy: Secondary | ICD-10-CM | POA: Diagnosis not present

## 2022-02-01 ENCOUNTER — Ambulatory Visit
Admission: RE | Admit: 2022-02-01 | Discharge: 2022-02-01 | Disposition: A | Discharge status: Home / Self Care | Payer: PPO | Source: Ambulatory Visit | Attending: Radiation Oncology | Admitting: Radiation Oncology

## 2022-02-01 ENCOUNTER — Other Ambulatory Visit: Payer: Self-pay

## 2022-02-01 DIAGNOSIS — C3412 Malignant neoplasm of upper lobe, left bronchus or lung: Secondary | ICD-10-CM | POA: Diagnosis not present

## 2022-02-01 DIAGNOSIS — Z510 Encounter for antineoplastic radiation therapy: Secondary | ICD-10-CM | POA: Diagnosis not present

## 2022-02-01 DIAGNOSIS — Z5111 Encounter for antineoplastic chemotherapy: Secondary | ICD-10-CM | POA: Diagnosis not present

## 2022-02-04 ENCOUNTER — Other Ambulatory Visit: Payer: Self-pay

## 2022-02-04 ENCOUNTER — Ambulatory Visit
Admission: RE | Admit: 2022-02-04 | Discharge: 2022-02-04 | Disposition: A | Discharge status: Home / Self Care | Payer: PPO | Source: Ambulatory Visit | Attending: Radiation Oncology | Admitting: Radiation Oncology

## 2022-02-04 DIAGNOSIS — C3412 Malignant neoplasm of upper lobe, left bronchus or lung: Secondary | ICD-10-CM | POA: Diagnosis not present

## 2022-02-04 DIAGNOSIS — Z5111 Encounter for antineoplastic chemotherapy: Secondary | ICD-10-CM | POA: Diagnosis not present

## 2022-02-04 DIAGNOSIS — Z510 Encounter for antineoplastic radiation therapy: Secondary | ICD-10-CM | POA: Diagnosis not present

## 2022-02-04 MED FILL — Dexamethasone Sodium Phosphate Inj 100 MG/10ML: INTRAMUSCULAR | Qty: 1 | Status: AC

## 2022-02-05 ENCOUNTER — Inpatient Hospital Stay: Payer: PPO

## 2022-02-05 ENCOUNTER — Encounter: Payer: Self-pay | Admitting: *Deleted

## 2022-02-05 ENCOUNTER — Inpatient Hospital Stay (HOSPITAL_BASED_OUTPATIENT_CLINIC_OR_DEPARTMENT_OTHER): Payer: PPO | Admitting: Internal Medicine

## 2022-02-05 ENCOUNTER — Ambulatory Visit
Admission: RE | Admit: 2022-02-05 | Discharge: 2022-02-05 | Disposition: A | Discharge status: Home / Self Care | Payer: PPO | Source: Ambulatory Visit | Attending: Radiation Oncology | Admitting: Radiation Oncology

## 2022-02-05 VITALS — BP 116/73 | HR 108 | Temp 98.9°F | Resp 20 | Ht 68.0 in | Wt 161.4 lb

## 2022-02-05 VITALS — BP 130/83 | HR 91 | Temp 98.0°F | Resp 20

## 2022-02-05 DIAGNOSIS — C3432 Malignant neoplasm of lower lobe, left bronchus or lung: Secondary | ICD-10-CM

## 2022-02-05 DIAGNOSIS — Z5111 Encounter for antineoplastic chemotherapy: Secondary | ICD-10-CM

## 2022-02-05 DIAGNOSIS — Z510 Encounter for antineoplastic radiation therapy: Secondary | ICD-10-CM | POA: Diagnosis not present

## 2022-02-05 DIAGNOSIS — C3412 Malignant neoplasm of upper lobe, left bronchus or lung: Secondary | ICD-10-CM | POA: Diagnosis not present

## 2022-02-05 LAB — CMP (CANCER CENTER ONLY)
ALT: 5 U/L (ref 0–44)
AST: 6 U/L — ABNORMAL LOW (ref 15–41)
Albumin: 3.6 g/dL (ref 3.5–5.0)
Alkaline Phosphatase: 92 U/L (ref 38–126)
Anion gap: 9 (ref 5–15)
BUN: 11 mg/dL (ref 8–23)
CO2: 25 mmol/L (ref 22–32)
Calcium: 8.9 mg/dL (ref 8.9–10.3)
Chloride: 103 mmol/L (ref 98–111)
Creatinine: 1.06 mg/dL (ref 0.61–1.24)
GFR, Estimated: >60 mL/min (ref >60)
Glucose, Bld: 113 mg/dL — ABNORMAL HIGH (ref 70–99)
Potassium: 3.6 mmol/L (ref 3.5–5.1)
Sodium: 137 mmol/L (ref 135–145)
Total Bilirubin: 0.5 mg/dL (ref 0.3–1.2)
Total Protein: 7.1 g/dL (ref 6.5–8.1)

## 2022-02-05 LAB — CBC WITH DIFFERENTIAL (CANCER CENTER ONLY)
Abs Immature Granulocytes: 0.08 10*3/uL — ABNORMAL HIGH (ref 0.00–0.07)
Basophils Absolute: 0 10*3/uL (ref 0.0–0.1)
Basophils Relative: 0 %
Eosinophils Absolute: 0.1 10*3/uL (ref 0.0–0.5)
Eosinophils Relative: 1 %
HCT: 38.7 % — ABNORMAL LOW (ref 39.0–52.0)
Hemoglobin: 12.4 g/dL — ABNORMAL LOW (ref 13.0–17.0)
Immature Granulocytes: 1 %
Lymphocytes Relative: 5 %
Lymphs Abs: 0.5 10*3/uL — ABNORMAL LOW (ref 0.7–4.0)
MCH: 27 pg (ref 26.0–34.0)
MCHC: 32 g/dL (ref 30.0–36.0)
MCV: 84.1 fL (ref 80.0–100.0)
Monocytes Absolute: 0.9 10*3/uL (ref 0.1–1.0)
Monocytes Relative: 9 %
Neutro Abs: 8.6 10*3/uL — ABNORMAL HIGH (ref 1.7–7.7)
Neutrophils Relative %: 84 %
Platelet Count: 448 10*3/uL — ABNORMAL HIGH (ref 150–400)
RBC: 4.6 MIL/uL (ref 4.22–5.81)
RDW: 13.7 % (ref 11.5–15.5)
WBC Count: 10.3 10*3/uL (ref 4.0–10.5)
nRBC: 0 % (ref 0.0–0.2)

## 2022-02-05 MED ORDER — PALONOSETRON HCL INJECTION 0.25 MG/5ML
0.2500 mg | Freq: Once | INTRAVENOUS | Status: AC
  Administered 2022-02-05: 0.25 mg via INTRAVENOUS
  Filled 2022-02-05: qty 5

## 2022-02-05 MED ORDER — FAMOTIDINE IN NACL 20-0.9 MG/50ML-% IV SOLN
20.0000 mg | Freq: Once | INTRAVENOUS | Status: AC
  Administered 2022-02-05: 20 mg via INTRAVENOUS
  Filled 2022-02-05: qty 50

## 2022-02-05 MED ORDER — SODIUM CHLORIDE 0.9 % IV SOLN
45.0000 mg/m2 | Freq: Once | INTRAVENOUS | Status: AC
  Administered 2022-02-05: 84 mg via INTRAVENOUS
  Filled 2022-02-05: qty 14

## 2022-02-05 MED ORDER — DIPHENHYDRAMINE HCL 50 MG/ML IJ SOLN
50.0000 mg | Freq: Once | INTRAMUSCULAR | Status: AC
  Administered 2022-02-05: 50 mg via INTRAVENOUS
  Filled 2022-02-05: qty 1

## 2022-02-05 MED ORDER — SODIUM CHLORIDE 0.9 % IV SOLN
170.0000 mg | Freq: Once | INTRAVENOUS | Status: AC
  Administered 2022-02-05: 170 mg via INTRAVENOUS
  Filled 2022-02-05: qty 17

## 2022-02-05 MED ORDER — SODIUM CHLORIDE 0.9 % IV SOLN
10.0000 mg | Freq: Once | INTRAVENOUS | Status: AC
  Administered 2022-02-05: 10 mg via INTRAVENOUS
  Filled 2022-02-05: qty 10

## 2022-02-05 MED ORDER — SODIUM CHLORIDE 0.9 % IV SOLN: Freq: Once | INTRAVENOUS | Status: AC

## 2022-02-05 NOTE — Progress Notes (Signed)
[place] [phone]   [phone]  OFFICE PROGRESS NOTE  Shirline Frees, MD [place] [id number]  DIAGNOSIS: Stage IIIb (T4, N2, M0) non-small cell lung cancer, squamous cell carcinoma presented with large left upper lobe lung mass with left hilar and subcarinal lymphadenopathy diagnosed in February 2023.  Molecular study by [employer] DETECTED ALTERATION(S) / BIOMARKER(S) % CFDNA OR AMPLIFICATION ASSOCIATED FDA-APPROVED THERAPIES CLINICAL TRIAL AVAILABILITY [id number] 4.4% None  Yes [id number] 1.1% None  Yes  PDL1 Expression 90%  PRIOR THERAPY:None  CURRENT THERAPY: A course of concurrent chemoradiation with weekly carboplatin for AUC of 2 and paclitaxel 45 Mg/M2   INTERVAL HISTORY: Peter Diaz 71 y.o. male returns to the clinic today for follow-up visit accompanied by his wife.  The patient is feeling fine today with no concerning complaints except for the cough and lack of appetite.  He also continues to have some wheezing.  He denied having any chest pain, shortness of breath or hemoptysis.  He denied having any nausea, vomiting, diarrhea or constipation.  He has no headache or visual changes.  He denied having any recent weight loss or night sweats.  He tolerated the first week of his treatment with carboplatin, paclitaxel and radiation fairly well.  He is here today for evaluation before starting cycle #2.  MEDICAL HISTORY: Past Medical History:  Diagnosis Date   GERD (gastroesophageal reflux disease)    Gout    HTN (hypertension)     ALLERGIES:  has No Known Allergies.  MEDICATIONS:  Current Outpatient Medications  Medication Sig Dispense Refill   albuterol (VENTOLIN HFA) 108 (90 Base) MCG/ACT inhaler Inhale 2 puffs into the lungs every 6 (six) hours as needed for wheezing or shortness of breath. (Patient not taking: Reported on 01/21/2022) 8 g 6   amLODipine (NORVASC) 10 MG tablet Take 10  mg by mouth daily.     esomeprazole (NEXIUM) 20 MG capsule Take 20 mg by mouth daily at 12 noon.     ibuprofen (ADVIL) 200 MG tablet Take 200 mg by mouth as needed.     prochlorperazine (COMPAZINE) 10 MG tablet Take 1 tablet (10 mg total) by mouth every 6 (six) hours as needed for nausea or vomiting. (Patient not taking: Reported on 01/21/2022) 30 tablet 0   tamsulosin (FLOMAX) 0.4 MG CAPS capsule Take 0.4 mg by mouth at bedtime.     No current facility-administered medications for this visit.    SURGICAL HISTORY:  Past Surgical History:  Procedure Laterality Date   BIOPSY  01/11/2022   Procedure: BIOPSY;  Surgeon: Garner Nash, DO;  Location: [facility];  Service: Pulmonary;;   BRAIN SURGERY     BRONCHIAL BRUSHINGS  01/11/2022   Procedure: BRONCHIAL BRUSHINGS;  Surgeon: Garner Nash, DO;  Location: [facility];  Service: Pulmonary;;   BRONCHIAL NEEDLE ASPIRATION BIOPSY  01/11/2022   Procedure: BRONCHIAL NEEDLE ASPIRATION BIOPSIES;  Surgeon: Garner Nash, DO;  Location: [facility] [facility];  Service: Pulmonary;;   VIDEO BRONCHOSCOPY WITH ENDOBRONCHIAL ULTRASOUND Left 01/11/2022   Procedure: VIDEO BRONCHOSCOPY WITH ENDOBRONCHIAL ULTRASOUND;  Surgeon: Garner Nash, DO;  Location: [facility];  Service: Pulmonary;  Laterality: Left;    REVIEW OF SYSTEMS:  A comprehensive review of systems was negative except for: Constitutional: positive for fatigue Respiratory: positive for cough, dyspnea on exertion, and wheezing   PHYSICAL EXAMINATION: General appearance: alert, cooperative, fatigued, and no distress Head: Normocephalic, without obvious abnormality,  atraumatic Neck: no adenopathy, no JVD, supple, symmetrical, trachea midline, and thyroid not enlarged, symmetric, no tenderness/mass/nodules Lymph nodes: Cervical, supraclavicular, and axillary nodes normal. Resp: wheezes RUL Back: symmetric, no curvature. ROM normal. No CVA tenderness. Cardio: regular rate and rhythm, S1, S2 normal,  no murmur, click, rub or gallop GI: soft, non-tender; bowel sounds normal; no masses,  no organomegaly Extremities: extremities normal, atraumatic, no cyanosis or edema  ECOG PERFORMANCE STATUS: 1 - Symptomatic but completely ambulatory  Blood pressure 116/73, pulse (!) 108, temperature 98.9 F (37.2 C), temperature source Tympanic, resp. rate 20, height _0  (1.727 m), weight 161 lb 6.4 oz (73.2 kg), SpO2 96 %.  LABORATORY DATA: Lab Results  Component Value Date   WBC 10.3 02/05/2022   HGB 12.4 (L) 02/05/2022   HCT 38.7 (L) 02/05/2022   MCV 84.1 02/05/2022   PLT 448 (H) 02/05/2022      Chemistry      Component Value Date/Time   NA 139 01/28/2022 1217   K 3.9 01/28/2022 1217   CL 105 01/28/2022 1217   CO2 27 01/28/2022 1217   BUN 10 01/28/2022 1217   CREATININE 1.09 01/28/2022 1217      Component Value Date/Time   CALCIUM 9.5 01/28/2022 1217   ALKPHOS 102 01/28/2022 1217   AST 8 (L) 01/28/2022 1217   ALT 7 01/28/2022 1217   BILITOT 0.3 01/28/2022 1217       RADIOGRAPHIC STUDIES: DG Chest 2 View  Result Date: 01/20/2022 CLINICAL DATA:  71 year old male with history of pulmonary mass. EXAM: CHEST - 2 VIEW COMPARISON:  Chest x-ray 11/16/2021. FINDINGS: Again noted is a large mass in the perihilar aspect of the left lung, which has some central lucency, compatible with cavitation as better demonstrated on prior CT and PET-CT examinations. Surrounding interstitial prominence is noted throughout the left mid to lower lung. Patchy areas of interstitial prominence are now noted in the right mid to lower lung, new compared to the prior study. No pleural effusions. No pneumothorax. No evidence of pulmonary edema. Heart size is normal. Upper mediastinal contours are within normal limits. IMPRESSION: 1. Similar appearance of perihilar mass in the left lung. 2. New areas of interstitial prominence throughout the right mid to lower lung, potentially infectious or inflammatory in  etiology. Electronically Signed   By: Vinnie Langton M.D.   On: 01/20/2022 13:37   MR BRAIN W WO CONTRAST  Result Date: 01/23/2022 CLINICAL DATA:  Provided history: Malignant neoplasm of unspecified part of unspecified bronchus or lung. Non-small cell lung cancer, staging. EXAM: MRI HEAD WITHOUT AND WITH CONTRAST TECHNIQUE: Multiplanar, multiecho pulse sequences of the brain and surrounding structures were obtained without and with intravenous contrast. CONTRAST:  87m GADAVIST GADOBUTROL 1 MMOL/ML IV SOLN COMPARISON:  PET-CT 01/08/2022. Report from head CT 02/19/1999 (images unavailable). FINDINGS: Mild intermittent motion degradation. Brain: Mild generalized cerebral and cerebellar atrophy. Sizable focus of chronic encephalomalacia/gliosis within the anterior left frontal lobe. Associated mild chronic hemosiderin deposition at this site. Ex vacuo dilatation of the left pleural ventricle. Small chronic cortical infarct within the posteromedial right frontal lobe. Small chronic cortical/subcortical infarct within the right parietal lobe. Background mild multifocal T2 FLAIR hyperintense signal abnormality within the cerebral white matter, nonspecific but compatible with chronic small vessel ischemic disease. There is no acute infarct. No evidence of an intracranial mass. No extra-axial fluid collection. No midline shift. No pathologic intracranial enhancement identified. Vascular: Maintained flow voids within the proximal large arterial vessels. Skull and upper cervical  spine: No focal suspicious marrow lesion. Apparent left frontal burr hole. Incompletely assessed cervical spondylosis. Degenerative appearing cyst within the upper cervical canal. Sinuses/Orbits: Visualized orbits show no acute finding. Postsurgical appearance of the paranasal sinuses. Moderate/severe mucosal thickening within the right frontal sinus. Mild mucosal thickening within the left frontal sinus. Fairly extensive T2 hyperintense  opacification of remaining bilateral ethmoid air cells. Mild mucosal thickening within the right sphenoid sinus. Complete T2 hyperintense opacification of the left sphenoid sinus. Near complete T2 hyperintense opacification of the right maxillary sinus. Mild mucosal thickening within the left maxillary sinus. Additionally, both maxillary sinuses demonstrate chronic reactive osteitis. Small mucous retention cyst within the right ostiomeatal unit. Other: Small bilateral mastoid effusions. IMPRESSION: 1. Mildly motion degraded examination. 2. No evidence of intracranial metastatic disease. 3. Sizable focus of chronic encephalomalacia/gliosis within the anterior left frontal lobe. 4. Small chronic cortically-based infarcts within the posteromedial right frontal lobe and right parietal lobe. 5. Background mild chronic small vessel image changes within the cerebral white matter. 6. Mild generalized cerebral and cerebellar atrophy. 7. Postsurgical changes to the paranasal sinuses with paranasal sinus disease, as described. 8. Small bilateral mastoid effusions. Electronically Signed   By: Kellie Simmering D.O.   On: 01/23/2022 09:09   NM PET Image Initial (PI) Skull Base To Thigh (F-18 FDG)  Result Date: 01/09/2022 CLINICAL DATA:  Initial treatment strategy for suspected bronchogenic neoplasm, non-small cell lung cancer with upcoming bronchoscopy for LEFT upper lobe mass. EXAM: NUCLEAR MEDICINE PET SKULL BASE TO THIGH TECHNIQUE: 8.3 mCi F-18 FDG was injected intravenously. Full-ring PET imaging was performed from the skull base to thigh after the radiotracer. CT data was obtained and used for attenuation correction and anatomic localization. Fasting blood glucose: 102 mg/dl COMPARISON:  Comparison is made with December 14, 2021 chest CT. FINDINGS: Mediastinal blood pool activity: SUV max 2.44 Liver activity: SUV max NA NECK: No hypermetabolic lymph nodes in the neck. Incidental CT findings: Hypoattenuation in the LEFT frontal  region corresponding diminished FDG uptake in this area compatible with encephalomalacia in this patient with prior "brain surgery" and reported prior abscesses in the LEFT frontal lobe, this area is incompletely imaged and without comparison aside from prior reports. Complete opacification of the RIGHT maxillary sinus compatible with chronic sinusitis. Signs of sinus disease with partial opacification of the LEFT maxillary sinus and scattered opacification of ethmoid sinuses as well as the RIGHT frontal sinus. CHEST: The LEFT hilar mass that was identified on the recent chest CT extending from the LEFT hilum in the LEFT upper lobe along the major fissure, distorting the major fissure and with extension also, directly contiguous with the LEFT hilum is. This measures 7.5 x 4.5 cm greatest axial dimension and when measured in a similar fashion on the prior study measured approximately 7.2 x 4.7 cm. Maximum SUV in this area is 24.1. Cystic and or cavitary changes are also noted in the LEFT lower lobe just posterior to the major fissure also with increased metabolic activity with a maximum SUV of 7.1 (image 95/4) this area measuring approximately 2.5 x 1.7 cm. LEFT perihilar adenopathy (image 76/4) 13 mm short axis, in nearly contiguous with the dominant mass and with evidence of increased metabolic activity with similar FDG uptake. Subcarinal adenopathy (image 80/4) 12 mm with a maximum SUV of 5.6. No additional areas of increased metabolic activity in the chest. Incidental CT findings: Background pulmonary emphysema. Narrowing of airways at the LEFT hilum secondary to LEFT hilar mass. Small LEFT-sided pleural  effusion layers dependently without associated increased metabolic activity with focal characteristics at this time. Calcified aortic atherosclerosis. Mass also abuts the superior aspect of the LEFT heart border but there is no pericardial effusion. The heart size is normal and there is calcified coronary artery  disease. Limited assessment of cardiovascular structures given lack of intravenous contrast. ABDOMEN/PELVIS: No abnormal hypermetabolic activity within the liver, pancreas, adrenal glands, or spleen. No hypermetabolic lymph nodes in the abdomen or pelvis. Incidental CT findings: Cholelithiasis. No acute findings relative to the liver, gallbladder, pancreas, spleen, adrenal glands or kidneys. No acute gastrointestinal process. Aortic atherosclerosis without aneurysm. Prostatomegaly. SKELETON: No focal hypermetabolic activity to suggest skeletal metastasis. Incidental CT findings: Spinal degenerative changes. IMPRESSION: Cavitary hypermetabolic LEFT hilar mass inseparable from the LEFT hilum and tracking along the major fissure into the peripheral posterior LEFT upper lobe, associated with subcarinal and LEFT hilar adenopathy with hypermetabolic features, highly suspicious for bronchogenic neoplasm with associated mediastinal adenopathy. Increased metabolic activity also noted in the adjacent LEFT lower lobe favored to represent dilated bronchial structures with postobstructive changes. Additional site of disease not entirely excluded at this time and associated with increased metabolic activity as above. Small LEFT-sided effusion. Calcified atherosclerosis of the thoracic and abdominal aorta, calcified coronary artery disease and pulmonary emphysema. Cholelithiasis. Extensive sinus disease worse in the RIGHT maxillary sinus. Correlate with any symptoms of chronic sinusitis. Incidental findings in the brain likely related to prior is surgery about the LEFT frontal lobe that is reported in the patient's history and made reference to on prior imaging reports. Aortic Atherosclerosis (ICD10-I70.0) and Emphysema (ICD10-J43.9). Electronically Signed   By: Zetta Bills M.D.   On: 01/09/2022 08:28    ASSESSMENT AND PLAN: This is a very pleasant 71 years old white male diagnosed with stage IIIb (T4, N2, M0) non-small cell  lung cancer, squamous cell carcinoma presented with large left upper lobe lung mass in addition to left hilar and subcarinal lymphadenopathy diagnosed in February 2023.  The patient has no actionable mutation and PD-L1 expression is 90%. He had MRI of the brain performed recently that showed no concerning findings for disease metastasis to the brain. The patient is currently undergoing a course of concurrent chemoradiation with weekly carboplatin for AUC of 2 and paclitaxel 45 Mg/M2 status post 1 week of treatment. He tolerated the first week of his treatment well with no concerning adverse effects. I recommended for him to proceed with cycle #2 today as planned. I also discussed with the patient the option of enrollment on the clinical trial with specific 9 after completion of the chemoradiation and he is interested in this option. He will come back for follow-up visit in 2 weeks for evaluation before starting cycle #4. The patient was advised to call immediately if he has any concerning complaints.  The patient voices understanding of current disease status and treatment options and is in agreement with the current care plan.  All questions were answered. The patient knows to call the clinic with any problems, questions or concerns. We can certainly see the patient much sooner if necessary.  The total time spent in the appointment was 20 minutes.  Disclaimer: This note was dictated with voice recognition software. Similar sounding words can inadvertently be transcribed and may not be corrected upon review.

## 2022-02-05 NOTE — Progress Notes (Signed)
Per Dr. Julien Nordmann - okay to treat with elevated heart rate, 108.

## 2022-02-05 NOTE — Research (Signed)
Trial:  PACIFIC-9 [id number]: A Phase III, double-blind, placebo-controlled, Randomised, Multicentre, International Study of Durvalumab Plus Oleclumab and Durvalumab Plus Monalizumab in Patients With Locally Advanced (Stage III), Unresectable Non-small Cell Lung Cancer (NSCLC) Who Have Not Progressed Following Definitive, Platinum-Based Concurrent Chemoradiation Therapy  Part I Patient Peter Diaz was identified by Dr. Julien Nordmann as a potential candidate for the above listed study.  This Clinical Research Nurse met with [name], [id number], on 02/05/22 in a manner and location that ensures patient privacy to discuss participation in the above listed research study.  Patient is Unaccompanied.  A copy of the informed consent document Part I with embedded HIPAA language was provided to the patient.  Patient reads, speaks, and understands [place].   Patient was provided with the business card of this Nurse and encouraged to contact the research team with any questions.  Approximately 5 minutes were spent with the patient reviewing the informed consent documents.  Patient was provided the option of taking informed consent documents home to review and was encouraged to review at their convenience with their support network, including other care providers. Patient took the consent documents home to review. Foye Spurling, BSN, RN, [employer] Research Nurse II 02/05/2022 12:26 PM

## 2022-02-05 NOTE — Patient Instructions (Signed)
[facility]   Discharge Instructions: Thank you for choosing [facility] to provide your oncology and hematology care.   If you have a lab appointment with the [facility], please go directly to the [facility] and check in at the registration area.   Wear comfortable clothing and clothing appropriate for easy access to any Portacath or PICC line.   We strive to give you quality time with your provider. You may need to reschedule your appointment if you arrive late (15 or more minutes).  Arriving late affects you and other patients whose appointments are after yours.  Also, if you miss three or more appointments without notifying the office, you may be dismissed from the clinic at the providers discretion.      For prescription refill requests, have your pharmacy contact our office and allow 72 hours for refills to be completed.    Today you received the following chemotherapy and/or immunotherapy agents: paclitaxel and carboplatin      To help prevent nausea and vomiting after your treatment, we encourage you to take your nausea medication as directed.  BELOW ARE SYMPTOMS THAT SHOULD BE REPORTED IMMEDIATELY: *FEVER GREATER THAN 100.4 F (38 C) OR HIGHER *CHILLS OR SWEATING *NAUSEA AND VOMITING THAT IS NOT CONTROLLED WITH YOUR NAUSEA MEDICATION *UNUSUAL SHORTNESS OF BREATH *UNUSUAL BRUISING OR BLEEDING *URINARY PROBLEMS (pain or burning when urinating, or frequent urination) *BOWEL PROBLEMS (unusual diarrhea, constipation, pain near the anus) TENDERNESS IN MOUTH AND THROAT WITH OR WITHOUT PRESENCE OF ULCERS (sore throat, sores in mouth, or a toothache) UNUSUAL RASH, SWELLING OR PAIN  UNUSUAL VAGINAL DISCHARGE OR ITCHING   Items with * indicate a potential emergency and should be followed up as soon as possible or go to the Emergency Department if any problems should occur.  Please show the CHEMOTHERAPY ALERT CARD or IMMUNOTHERAPY ALERT  CARD at check-in to the Emergency Department and triage nurse.  Should you have questions after your visit or need to cancel or reschedule your appointment, please contact [facility]  Dept: [phone]  and follow the prompts.  Office hours are 8:00 a.m. to 4:30 p.m. Monday - Friday. Please note that voicemails left after 4:00 p.m. may not be returned until the following business day.  We are closed weekends and major holidays. You have access to a nurse at all times for urgent questions. Please call the main number to the clinic Dept: [phone] and follow the prompts.   For any non-urgent questions, you may also contact your provider using MyChart. We now offer e-Visits for anyone 7 and older to request care online for non-urgent symptoms. For details visit [email or web address].   Also download the MyChart app! Go to the app store, search "MyChart", open the app, select [facility], and log in with your MyChart username and password.  Due to Covid, a mask is required upon entering the hospital/clinic. If you do not have a mask, one will be given to you upon arrival. For doctor visits, patients may have 1 support person aged 59 or older with them. For treatment visits, patients cannot have anyone with them due to current Covid guidelines and our immunocompromised population.   Paclitaxel injection What is this medication? PACLITAXEL (PAK li TAX el) is a chemotherapy drug. It targets fast dividing cells, like cancer cells, and causes these cells to die. This medicine is used to treat ovarian cancer, breast cancer, lung cancer, Kaposi's sarcoma, and other cancers.  This medicine may be used for other purposes; ask your health care provider or pharmacist if you have questions. COMMON BRAND NAME(S): Onxol, Taxol What should I tell my care team before I take this medication? They need to know if you have any of these conditions: history of irregular heartbeat liver  disease low blood counts, like low white cell, platelet, or red cell counts lung or breathing disease, like asthma tingling of the fingers or toes, or other nerve disorder an unusual or allergic reaction to paclitaxel, alcohol, polyoxyethylated castor oil, other chemotherapy, other medicines, foods, dyes, or preservatives pregnant or trying to get pregnant breast-feeding How should I use this medication? This drug is given as an infusion into a vein. It is administered in a hospital or clinic by a specially trained health care professional. Talk to your pediatrician regarding the use of this medicine in children. Special care may be needed. Overdosage: If you think you have taken too much of this medicine contact a poison control center or emergency room at once. NOTE: This medicine is only for you. Do not share this medicine with others. What if I miss a dose? It is important not to miss your dose. Call your doctor or health care professional if you are unable to keep an appointment. What may interact with this medication? Do not take this medicine with any of the following medications: live virus vaccines This medicine may also interact with the following medications: antiviral medicines for hepatitis, HIV or AIDS certain antibiotics like erythromycin and clarithromycin certain medicines for fungal infections like ketoconazole and itraconazole certain medicines for seizures like carbamazepine, phenobarbital, phenytoin gemfibrozil nefazodone rifampin St. John's wort This list may not describe all possible interactions. Give your health care provider a list of all the medicines, herbs, non-prescription drugs, or dietary supplements you use. Also tell them if you smoke, drink alcohol, or use illegal drugs. Some items may interact with your medicine. What should I watch for while using this medication? Your condition will be monitored carefully while you are receiving this medicine. You  will need important blood work done while you are taking this medicine. This medicine can cause serious allergic reactions. To reduce your risk you will need to take other medicine(s) before treatment with this medicine. If you experience allergic reactions like skin rash, itching or hives, swelling of the face, lips, or tongue, tell your doctor or health care professional right away. In some cases, you may be given additional medicines to help with side effects. Follow all directions for their use. This drug may make you feel generally unwell. This is not uncommon, as chemotherapy can affect healthy cells as well as cancer cells. Report any side effects. Continue your course of treatment even though you feel ill unless your doctor tells you to stop. Call your doctor or health care professional for advice if you get a fever, chills or sore throat, or other symptoms of a cold or flu. Do not treat yourself. This drug decreases your body's ability to fight infections. Try to avoid being around people who are sick. This medicine may increase your risk to bruise or bleed. Call your doctor or health care professional if you notice any unusual bleeding. Be careful brushing and flossing your teeth or using a toothpick because you may get an infection or bleed more easily. If you have any dental work done, tell your dentist you are receiving this medicine. Avoid taking products that contain aspirin, acetaminophen, ibuprofen, naproxen, or ketoprofen unless  instructed by your doctor. These medicines may hide a fever. Do not become pregnant while taking this medicine. Women should inform their doctor if they wish to become pregnant or think they might be pregnant. There is a potential for serious side effects to an unborn child. Talk to your health care professional or pharmacist for more information. Do not breast-feed an infant while taking this medicine. Men are advised not to father a child while receiving this  medicine. This product may contain alcohol. Ask your pharmacist or healthcare provider if this medicine contains alcohol. Be sure to tell all healthcare providers you are taking this medicine. Certain medicines, like metronidazole and disulfiram, can cause an unpleasant reaction when taken with alcohol. The reaction includes flushing, headache, nausea, vomiting, sweating, and increased thirst. The reaction can last from 30 minutes to several hours. What side effects may I notice from receiving this medication? Side effects that you should report to your doctor or health care professional as soon as possible: allergic reactions like skin rash, itching or hives, swelling of the face, lips, or tongue breathing problems changes in vision fast, irregular heartbeat high or low blood pressure mouth sores pain, tingling, numbness in the hands or feet signs of decreased platelets or bleeding - bruising, pinpoint red spots on the skin, black, tarry stools, blood in the urine signs of decreased red blood cells - unusually weak or tired, feeling faint or lightheaded, falls signs of infection - fever or chills, cough, sore throat, pain or difficulty passing urine signs and symptoms of liver injury like dark yellow or brown urine; general ill feeling or flu-like symptoms; light-colored stools; loss of appetite; nausea; right upper belly pain; unusually weak or tired; yellowing of the eyes or skin swelling of the ankles, feet, hands unusually slow heartbeat Side effects that usually do not require medical attention (report to your doctor or health care professional if they continue or are bothersome): diarrhea hair loss loss of appetite muscle or joint pain nausea, vomiting pain, redness, or irritation at site where injected tiredness This list may not describe all possible side effects. Call your doctor for medical advice about side effects. You may report side effects to FDA at 1-800-FDA-1088. Where  should I keep my medication? This drug is given in a hospital or clinic and will not be stored at home. NOTE: This sheet is a summary. It may not cover all possible information. If you have questions about this medicine, talk to your doctor, pharmacist, or health care provider.  2022 Elsevier/Gold Standard (2021-08-14 00:00:00)  Carboplatin injection What is this medication? CARBOPLATIN (KAR boe pla tin) is a chemotherapy drug. It targets fast dividing cells, like cancer cells, and causes these cells to die. This medicine is used to treat ovarian cancer and many other cancers. This medicine may be used for other purposes; ask your health care provider or pharmacist if you have questions. COMMON BRAND NAME(S): Paraplatin What should I tell my care team before I take this medication? They need to know if you have any of these conditions: blood disorders hearing problems kidney disease recent or ongoing radiation therapy an unusual or allergic reaction to carboplatin, cisplatin, other chemotherapy, other medicines, foods, dyes, or preservatives pregnant or trying to get pregnant breast-feeding How should I use this medication? This drug is usually given as an infusion into a vein. It is administered in a hospital or clinic by a specially trained health care professional. Talk to your pediatrician regarding the use of this  medicine in children. Special care may be needed. Overdosage: If you think you have taken too much of this medicine contact a poison control center or emergency room at once. NOTE: This medicine is only for you. Do not share this medicine with others. What if I miss a dose? It is important not to miss a dose. Call your doctor or health care professional if you are unable to keep an appointment. What may interact with this medication? medicines for seizures medicines to increase blood counts like filgrastim, pegfilgrastim, sargramostim some antibiotics like amikacin,  gentamicin, neomycin, streptomycin, tobramycin vaccines Talk to your doctor or health care professional before taking any of these medicines: acetaminophen aspirin ibuprofen ketoprofen naproxen This list may not describe all possible interactions. Give your health care provider a list of all the medicines, herbs, non-prescription drugs, or dietary supplements you use. Also tell them if you smoke, drink alcohol, or use illegal drugs. Some items may interact with your medicine. What should I watch for while using this medication? Your condition will be monitored carefully while you are receiving this medicine. You will need important blood work done while you are taking this medicine. This drug may make you feel generally unwell. This is not uncommon, as chemotherapy can affect healthy cells as well as cancer cells. Report any side effects. Continue your course of treatment even though you feel ill unless your doctor tells you to stop. In some cases, you may be given additional medicines to help with side effects. Follow all directions for their use. Call your doctor or health care professional for advice if you get a fever, chills or sore throat, or other symptoms of a cold or flu. Do not treat yourself. This drug decreases your body's ability to fight infections. Try to avoid being around people who are sick. This medicine may increase your risk to bruise or bleed. Call your doctor or health care professional if you notice any unusual bleeding. Be careful brushing and flossing your teeth or using a toothpick because you may get an infection or bleed more easily. If you have any dental work done, tell your dentist you are receiving this medicine. Avoid taking products that contain aspirin, acetaminophen, ibuprofen, naproxen, or ketoprofen unless instructed by your doctor. These medicines may hide a fever. Do not become pregnant while taking this medicine. Women should inform their doctor if they wish  to become pregnant or think they might be pregnant. There is a potential for serious side effects to an unborn child. Talk to your health care professional or pharmacist for more information. Do not breast-feed an infant while taking this medicine. What side effects may I notice from receiving this medication? Side effects that you should report to your doctor or health care professional as soon as possible: allergic reactions like skin rash, itching or hives, swelling of the face, lips, or tongue signs of infection - fever or chills, cough, sore throat, pain or difficulty passing urine signs of decreased platelets or bleeding - bruising, pinpoint red spots on the skin, black, tarry stools, nosebleeds signs of decreased red blood cells - unusually weak or tired, fainting spells, lightheadedness breathing problems changes in hearing changes in vision chest pain high blood pressure low blood counts - This drug may decrease the number of white blood cells, red blood cells and platelets. You may be at increased risk for infections and bleeding. nausea and vomiting pain, swelling, redness or irritation at the injection site pain, tingling, numbness in the hands  or feet problems with balance, talking, walking trouble passing urine or change in the amount of urine Side effects that usually do not require medical attention (report to your doctor or health care professional if they continue or are bothersome): hair loss loss of appetite metallic taste in the mouth or changes in taste This list may not describe all possible side effects. Call your doctor for medical advice about side effects. You may report side effects to FDA at 1-800-FDA-1088. Where should I keep my medication? This drug is given in a hospital or clinic and will not be stored at home. NOTE: This sheet is a summary. It may not cover all possible information. If you have questions about this medicine, talk to your doctor, pharmacist,  or health care provider.  2022 Elsevier/Gold Standard (2008-05-04 00:00:00)

## 2022-02-06 ENCOUNTER — Ambulatory Visit
Admission: RE | Admit: 2022-02-06 | Discharge: 2022-02-06 | Disposition: A | Discharge status: Home / Self Care | Payer: PPO | Source: Ambulatory Visit | Attending: Radiation Oncology | Admitting: Radiation Oncology

## 2022-02-06 ENCOUNTER — Other Ambulatory Visit: Payer: Self-pay

## 2022-02-06 DIAGNOSIS — Z510 Encounter for antineoplastic radiation therapy: Secondary | ICD-10-CM | POA: Insufficient documentation

## 2022-02-06 DIAGNOSIS — Z79899 Other long term (current) drug therapy: Secondary | ICD-10-CM | POA: Insufficient documentation

## 2022-02-06 DIAGNOSIS — C3432 Malignant neoplasm of lower lobe, left bronchus or lung: Secondary | ICD-10-CM | POA: Insufficient documentation

## 2022-02-06 DIAGNOSIS — Z923 Personal history of irradiation: Secondary | ICD-10-CM | POA: Insufficient documentation

## 2022-02-06 DIAGNOSIS — E860 Dehydration: Secondary | ICD-10-CM | POA: Insufficient documentation

## 2022-02-06 DIAGNOSIS — Z5111 Encounter for antineoplastic chemotherapy: Secondary | ICD-10-CM | POA: Insufficient documentation

## 2022-02-06 DIAGNOSIS — C3412 Malignant neoplasm of upper lobe, left bronchus or lung: Secondary | ICD-10-CM | POA: Insufficient documentation

## 2022-02-07 ENCOUNTER — Ambulatory Visit
Admission: RE | Admit: 2022-02-07 | Discharge: 2022-02-07 | Disposition: A | Discharge status: Home / Self Care | Payer: PPO | Source: Ambulatory Visit | Attending: Radiation Oncology | Admitting: Radiation Oncology

## 2022-02-07 DIAGNOSIS — Z5111 Encounter for antineoplastic chemotherapy: Secondary | ICD-10-CM | POA: Diagnosis not present

## 2022-02-07 DIAGNOSIS — C3412 Malignant neoplasm of upper lobe, left bronchus or lung: Secondary | ICD-10-CM | POA: Diagnosis not present

## 2022-02-08 ENCOUNTER — Ambulatory Visit
Admission: RE | Admit: 2022-02-08 | Discharge: 2022-02-08 | Disposition: A | Discharge status: Home / Self Care | Payer: PPO | Source: Ambulatory Visit | Attending: Radiation Oncology | Admitting: Radiation Oncology

## 2022-02-08 ENCOUNTER — Other Ambulatory Visit: Payer: Self-pay

## 2022-02-08 DIAGNOSIS — C3412 Malignant neoplasm of upper lobe, left bronchus or lung: Secondary | ICD-10-CM | POA: Diagnosis not present

## 2022-02-08 DIAGNOSIS — Z5111 Encounter for antineoplastic chemotherapy: Secondary | ICD-10-CM | POA: Diagnosis not present

## 2022-02-08 MED FILL — Dexamethasone Sodium Phosphate Inj 100 MG/10ML: INTRAMUSCULAR | Qty: 1 | Status: AC

## 2022-02-11 ENCOUNTER — Encounter: Payer: Self-pay | Admitting: *Deleted

## 2022-02-11 ENCOUNTER — Inpatient Hospital Stay: Payer: PPO | Attending: Internal Medicine

## 2022-02-11 ENCOUNTER — Inpatient Hospital Stay: Payer: PPO

## 2022-02-11 ENCOUNTER — Other Ambulatory Visit: Payer: Self-pay

## 2022-02-11 ENCOUNTER — Ambulatory Visit
Admission: RE | Admit: 2022-02-11 | Discharge: 2022-02-11 | Disposition: A | Discharge status: Home / Self Care | Payer: PPO | Source: Ambulatory Visit | Attending: Radiation Oncology | Admitting: Radiation Oncology

## 2022-02-11 VITALS — BP 132/67 | HR 91 | Temp 97.7°F | Resp 17 | Wt 159.8 lb

## 2022-02-11 DIAGNOSIS — Z79899 Other long term (current) drug therapy: Secondary | ICD-10-CM | POA: Insufficient documentation

## 2022-02-11 DIAGNOSIS — C3412 Malignant neoplasm of upper lobe, left bronchus or lung: Secondary | ICD-10-CM | POA: Diagnosis not present

## 2022-02-11 DIAGNOSIS — Z5111 Encounter for antineoplastic chemotherapy: Secondary | ICD-10-CM | POA: Diagnosis not present

## 2022-02-11 DIAGNOSIS — Z923 Personal history of irradiation: Secondary | ICD-10-CM | POA: Insufficient documentation

## 2022-02-11 DIAGNOSIS — C3432 Malignant neoplasm of lower lobe, left bronchus or lung: Secondary | ICD-10-CM

## 2022-02-11 DIAGNOSIS — E860 Dehydration: Secondary | ICD-10-CM | POA: Insufficient documentation

## 2022-02-11 LAB — CBC WITH DIFFERENTIAL (CANCER CENTER ONLY)
Abs Immature Granulocytes: 0.04 10*3/uL (ref 0.00–0.07)
Basophils Absolute: 0 10*3/uL (ref 0.0–0.1)
Basophils Relative: 1 %
Eosinophils Absolute: 0.1 10*3/uL (ref 0.0–0.5)
Eosinophils Relative: 2 %
HCT: 37.1 % — ABNORMAL LOW (ref 39.0–52.0)
Hemoglobin: 11.8 g/dL — ABNORMAL LOW (ref 13.0–17.0)
Immature Granulocytes: 1 %
Lymphocytes Relative: 9 %
Lymphs Abs: 0.5 10*3/uL — ABNORMAL LOW (ref 0.7–4.0)
MCH: 27.1 pg (ref 26.0–34.0)
MCHC: 31.8 g/dL (ref 30.0–36.0)
MCV: 85.1 fL (ref 80.0–100.0)
Monocytes Absolute: 0.6 10*3/uL (ref 0.1–1.0)
Monocytes Relative: 11 %
Neutro Abs: 4.1 10*3/uL (ref 1.7–7.7)
Neutrophils Relative %: 76 %
Platelet Count: 368 10*3/uL (ref 150–400)
RBC: 4.36 MIL/uL (ref 4.22–5.81)
RDW: 13.9 % (ref 11.5–15.5)
WBC Count: 5.3 10*3/uL (ref 4.0–10.5)
nRBC: 0 % (ref 0.0–0.2)

## 2022-02-11 LAB — CMP (CANCER CENTER ONLY)
ALT: 7 U/L (ref 0–44)
AST: 8 U/L — ABNORMAL LOW (ref 15–41)
Albumin: 3.5 g/dL (ref 3.5–5.0)
Alkaline Phosphatase: 94 U/L (ref 38–126)
Anion gap: 9 (ref 5–15)
BUN: 11 mg/dL (ref 8–23)
CO2: 24 mmol/L (ref 22–32)
Calcium: 8.7 mg/dL — ABNORMAL LOW (ref 8.9–10.3)
Chloride: 103 mmol/L (ref 98–111)
Creatinine: 1.06 mg/dL (ref 0.61–1.24)
GFR, Estimated: >60 mL/min (ref >60)
Glucose, Bld: 106 mg/dL — ABNORMAL HIGH (ref 70–99)
Potassium: 3.9 mmol/L (ref 3.5–5.1)
Sodium: 136 mmol/L (ref 135–145)
Total Bilirubin: 0.4 mg/dL (ref 0.3–1.2)
Total Protein: 6.8 g/dL (ref 6.5–8.1)

## 2022-02-11 MED ORDER — FAMOTIDINE IN NACL 20-0.9 MG/50ML-% IV SOLN
20.0000 mg | Freq: Once | INTRAVENOUS | Status: AC
  Administered 2022-02-11: 20 mg via INTRAVENOUS
  Filled 2022-02-11: qty 50

## 2022-02-11 MED ORDER — SODIUM CHLORIDE 0.9 % IV SOLN
10.0000 mg | Freq: Once | INTRAVENOUS | Status: AC
  Administered 2022-02-11: 10 mg via INTRAVENOUS
  Filled 2022-02-11: qty 10

## 2022-02-11 MED ORDER — SODIUM CHLORIDE 0.9 % IV SOLN
174.4000 mg | Freq: Once | INTRAVENOUS | Status: AC
  Administered 2022-02-11: 170 mg via INTRAVENOUS
  Filled 2022-02-11: qty 17

## 2022-02-11 MED ORDER — DIPHENHYDRAMINE HCL 50 MG/ML IJ SOLN
50.0000 mg | Freq: Once | INTRAMUSCULAR | Status: AC
  Administered 2022-02-11: 50 mg via INTRAVENOUS
  Filled 2022-02-11: qty 1

## 2022-02-11 MED ORDER — SODIUM CHLORIDE 0.9 % IV SOLN: Freq: Once | INTRAVENOUS | Status: AC

## 2022-02-11 MED ORDER — SODIUM CHLORIDE 0.9 % IV SOLN
45.0000 mg/m2 | Freq: Once | INTRAVENOUS | Status: AC
  Administered 2022-02-11: 84 mg via INTRAVENOUS
  Filled 2022-02-11: qty 14

## 2022-02-11 MED ORDER — PALONOSETRON HCL INJECTION 0.25 MG/5ML
0.2500 mg | Freq: Once | INTRAVENOUS | Status: AC
  Administered 2022-02-11: 0.25 mg via INTRAVENOUS
  Filled 2022-02-11: qty 5

## 2022-02-11 NOTE — Patient Instructions (Signed)
[facility]  Discharge Instructions: ?Thank you for choosing [facility] to provide your oncology and hematology care.  ? ?If you have a lab appointment with the [facility], please go directly to the [facility] and check in at the registration area. ?  ?Wear comfortable clothing and clothing appropriate for easy access to any Portacath or PICC line.  ? ?We strive to give you quality time with your provider. You may need to reschedule your appointment if you arrive late (15 or more minutes).  Arriving late affects you and other patients whose appointments are after yours.  Also, if you miss three or more appointments without notifying the office, you may be dismissed from the clinic at the provider?s discretion.    ?  ?For prescription refill requests, have your pharmacy contact our office and allow 72 hours for refills to be completed.   ? ?Today you received the following chemotherapy and/or immunotherapy agents: Paclitaxel and Carboplatin    ?  ?To help prevent nausea and vomiting after your treatment, we encourage you to take your nausea medication as directed. ? ?BELOW ARE SYMPTOMS THAT SHOULD BE REPORTED IMMEDIATELY: ?*FEVER GREATER THAN 100.4 F (38 ?C) OR HIGHER ?*CHILLS OR SWEATING ?*NAUSEA AND VOMITING THAT IS NOT CONTROLLED WITH YOUR NAUSEA MEDICATION ?*UNUSUAL SHORTNESS OF BREATH ?*UNUSUAL BRUISING OR BLEEDING ?*URINARY PROBLEMS (pain or burning when urinating, or frequent urination) ?*BOWEL PROBLEMS (unusual diarrhea, constipation, pain near the anus) ?TENDERNESS IN MOUTH AND THROAT WITH OR WITHOUT PRESENCE OF ULCERS (sore throat, sores in mouth, or a toothache) ?UNUSUAL RASH, SWELLING OR PAIN  ?UNUSUAL VAGINAL DISCHARGE OR ITCHING  ? ?Items with * indicate a potential emergency and should be followed up as soon as possible or go to the Emergency Department if any problems should occur. ? ?Please show the CHEMOTHERAPY ALERT CARD or IMMUNOTHERAPY ALERT  CARD at check-in to the Emergency Department and triage nurse. ? ?Should you have questions after your visit or need to cancel or reschedule your appointment, please contact [facility]  Dept: [phone]  and follow the prompts.  Office hours are 8:00 a.m. to 4:30 p.m. Monday - Friday. Please note that voicemails left after 4:00 p.m. may not be returned until the following business day.  We are closed weekends and major holidays. You have access to a nurse at all times for urgent questions. Please call the main number to the clinic Dept: [phone] and follow the prompts. ? ? ?For any non-urgent questions, you may also contact your provider using MyChart. We now offer e-Visits for anyone 55 and older to request care online for non-urgent symptoms. For details visit [email or web address]. ?  ?Also download the MyChart app! Go to the app store, search "MyChart", open the app, select [facility], and log in with your MyChart username and password. ? ?Due to Covid, a mask is required upon entering the hospital/clinic. If you do not have a mask, one will be given to you upon arrival. For doctor visits, patients may have 1 support person aged 3 or older with them. For treatment visits, patients cannot have anyone with them due to current Covid guidelines and our immunocompromised population.  ? ?

## 2022-02-11 NOTE — Research (Signed)
PACIFIC-9 [id number]: Met with patient in infusion room for 5 minutes to follow up on the study and see if he has any questions. Patient states he forgot to read the information that was given to him last week. He says he is still interested in reviewing it and agreed for research nurse to check back with him again next week. Offered to meet with him any day this week after radiation but he said next week is best. Reminded patient that participation is voluntary and that part I of the study involves providing tissue from his biopsy to the study to see if he is eligible for the main study. Patient states he still has my business card and the copy of the ICF for Part I. Gave patient a copy of the ICF for the main study to review as well since that might inform his decision to participate in Part I. Thanked patient for his time and encouraged him to call if any questions prior to next week. He verbalized understanding.  ?Foye Spurling, BSN, RN, CCRP ?Clinical Research Nurse II ?02/11/2022 3:35 PM ? ?

## 2022-02-12 ENCOUNTER — Ambulatory Visit
Admission: RE | Admit: 2022-02-12 | Discharge: 2022-02-12 | Disposition: A | Discharge status: Home / Self Care | Payer: PPO | Source: Ambulatory Visit | Attending: Radiation Oncology | Admitting: Radiation Oncology

## 2022-02-12 DIAGNOSIS — C3412 Malignant neoplasm of upper lobe, left bronchus or lung: Secondary | ICD-10-CM | POA: Diagnosis not present

## 2022-02-12 DIAGNOSIS — Z5111 Encounter for antineoplastic chemotherapy: Secondary | ICD-10-CM | POA: Diagnosis not present

## 2022-02-13 ENCOUNTER — Ambulatory Visit
Admission: RE | Admit: 2022-02-13 | Discharge: 2022-02-13 | Disposition: A | Discharge status: Home / Self Care | Payer: PPO | Source: Ambulatory Visit | Attending: Radiation Oncology | Admitting: Radiation Oncology

## 2022-02-13 ENCOUNTER — Other Ambulatory Visit: Payer: Self-pay

## 2022-02-13 DIAGNOSIS — C3412 Malignant neoplasm of upper lobe, left bronchus or lung: Secondary | ICD-10-CM | POA: Diagnosis not present

## 2022-02-13 DIAGNOSIS — Z5111 Encounter for antineoplastic chemotherapy: Secondary | ICD-10-CM | POA: Diagnosis not present

## 2022-02-13 NOTE — Progress Notes (Signed)
[name] OFFICE PROGRESS NOTE  Peter Frees, MD [place] [address]  DIAGNOSIS: Stage IIIb (T4, N2, M0) non-small cell lung cancer, squamous cell carcinoma presented with large left upper lobe lung mass with left hilar and subcarinal lymphadenopathy diagnosed in February 2023.   Molecular study by [employer] DETECTED ALTERATION(S) / BIOMARKER(S)      % CFDNA OR AMPLIFICATION        ASSOCIATED FDA-APPROVED THERAPIES         CLINICAL TRIAL AVAILABILITY [id number] 4.4% None     Yes [id number] 1.1% None     Yes   PDL1 Expression 90%  PRIOR THERAPY: None  CURRENT THERAPY: A course of concurrent chemoradiation with weekly carboplatin for AUC of 2 and paclitaxel 45 Mg/M2. He is status post 3 cycles.   INTERVAL HISTORY: Peter Diaz 71 y.o. male returns to the clinic for a follow up visit. The patient is feeling well today without any concerning complaints. His last day of radiation is 03/08/22. The patient continues to tolerate treatment well without any adverse effects. Denies any fever, chills, or night sweat. He lost some weight due to decreased appetite. Denies any chest pain or hemoptysis. His breathing is "good". He has mild dyspnea on exertion depending on the activity, such as climbing steps. Denies significant cough.Denies any nausea, vomiting, diarrhea, or constipation. Denies any headache or visual changes. The patient is here today for evaluation prior to starting cycle # 4   MEDICAL HISTORY: Past Medical History:  Diagnosis Date   GERD (gastroesophageal reflux disease)    Gout    HTN (hypertension)     ALLERGIES:  has No Known Allergies.  MEDICATIONS:  Current Outpatient Medications  Medication Sig Dispense Refill   amLODipine (NORVASC) 10 MG tablet Take 10 mg by mouth daily.     esomeprazole (NEXIUM) 20 MG capsule Take 20 mg by mouth daily at 12 noon.     ibuprofen (ADVIL) 200 MG tablet Take 200 mg by mouth as  needed.     tamsulosin (FLOMAX) 0.4 MG CAPS capsule Take 0.4 mg by mouth at bedtime.     albuterol (VENTOLIN HFA) 108 (90 Base) MCG/ACT inhaler Inhale 2 puffs into the lungs every 6 (six) hours as needed for wheezing or shortness of breath. (Patient not taking: Reported on 01/21/2022) 8 g 6   prochlorperazine (COMPAZINE) 10 MG tablet Take 1 tablet (10 mg total) by mouth every 6 (six) hours as needed for nausea or vomiting. (Patient not taking: Reported on 01/21/2022) 30 tablet 0   No current facility-administered medications for this visit.    SURGICAL HISTORY:  Past Surgical History:  Procedure Laterality Date   BIOPSY  01/11/2022   Procedure: BIOPSY;  Surgeon: Peter Nash, DO;  Location: [facility];  Service: Pulmonary;;   BRAIN SURGERY     BRONCHIAL BRUSHINGS  01/11/2022   Procedure: BRONCHIAL BRUSHINGS;  Surgeon: Peter Nash, DO;  Location: [facility];  Service: Pulmonary;;   BRONCHIAL NEEDLE ASPIRATION BIOPSY  01/11/2022   Procedure: BRONCHIAL NEEDLE ASPIRATION BIOPSIES;  Surgeon: Peter Nash, DO;  Location: [facility];  Service: Pulmonary;;   VIDEO BRONCHOSCOPY WITH ENDOBRONCHIAL ULTRASOUND Left 01/11/2022   Procedure: VIDEO BRONCHOSCOPY WITH ENDOBRONCHIAL ULTRASOUND;  Surgeon: Peter Nash, DO;  Location: [facility];  Service: Pulmonary;  Laterality: Left;    REVIEW OF SYSTEMS:   Review of Systems  Constitutional: Positive for weight loss/early satiety. Negative for chills and fever.  HENT: Negative for mouth sores, nosebleeds, sore throat and trouble swallowing.   Eyes: Negative for eye problems and icterus.  Respiratory: Positive for mild dyspnea on exertion. Negative for cough, hemoptysis, and wheezing.   Cardiovascular: Negative for chest pain and leg swelling.  Gastrointestinal: Negative for abdominal pain, constipation, diarrhea, nausea and vomiting.  Genitourinary: Negative for bladder incontinence, difficulty urinating, dysuria, frequency and hematuria.    Musculoskeletal: Negative for back pain, gait problem, neck pain and neck stiffness.  Skin: Negative for itching and rash.  Neurological: Negative for dizziness, extremity weakness, gait problem, headaches, light-headedness and seizures.  Hematological: Negative for adenopathy. Does not bruise/bleed easily.  Psychiatric/Behavioral: Negative for confusion, depression and sleep disturbance. The patient is not nervous/anxious.     PHYSICAL EXAMINATION:  Blood pressure 111/81, pulse 95, temperature (!) 97.2 F (36.2 C), temperature source Tympanic, resp. rate 18, height 5' 8"  (1.727 m), weight 157 lb 3.2 oz (71.3 kg), SpO2 97 %.  ECOG PERFORMANCE STATUS: 1  Physical Exam  Constitutional: Oriented to person, place, and time and well-developed, well-nourished, and in no distress. HENT:  Head: Normocephalic and atraumatic.  Mouth/Throat: Oropharynx is clear and moist. No oropharyngeal exudate.  Eyes: Conjunctivae are normal. Right eye exhibits no discharge. Left eye exhibits no discharge. No scleral icterus.  Neck: Normal range of motion. Neck supple.  Cardiovascular: Normal rate, regular rhythm, normal heart sounds and intact distal pulses.   Pulmonary/Chest: Effort normal. Quiet breath sounds bilaterally. No respiratory distress. No wheezes. No rales.  Abdominal: Soft. Bowel sounds are normal. Exhibits no distension and no mass. There is no tenderness.  Musculoskeletal: Normal range of motion. Exhibits no edema.  Lymphadenopathy:    No cervical adenopathy.  Neurological: Alert and oriented to person, place, and time. Exhibits normal muscle tone. Gait normal. Coordination normal.  Skin: Skin is warm and dry. No rash noted. Not diaphoretic. No erythema. No pallor.  Psychiatric: Mood, memory and judgment normal.  Vitals reviewed.  LABORATORY DATA: Lab Results  Component Value Date   WBC 4.3 02/18/2022   HGB 13.2 02/18/2022   HCT 42.1 02/18/2022   MCV 86.3 02/18/2022   PLT 363  02/18/2022      Chemistry      Component Value Date/Time   NA 139 02/18/2022 1031   K 4.4 02/18/2022 1031   CL 105 02/18/2022 1031   CO2 26 02/18/2022 1031   BUN 11 02/18/2022 1031   CREATININE 1.10 02/18/2022 1031      Component Value Date/Time   CALCIUM 9.3 02/18/2022 1031   ALKPHOS 104 02/18/2022 1031   AST 11 (L) 02/18/2022 1031   ALT 8 02/18/2022 1031   BILITOT 0.4 02/18/2022 1031       RADIOGRAPHIC STUDIES:  MR BRAIN W WO CONTRAST  Result Date: 01/23/2022 CLINICAL DATA:  Provided history: Malignant neoplasm of unspecified part of unspecified bronchus or lung. Non-small cell lung cancer, staging. EXAM: MRI HEAD WITHOUT AND WITH CONTRAST TECHNIQUE: Multiplanar, multiecho pulse sequences of the brain and surrounding structures were obtained without and with intravenous contrast. CONTRAST:  66m GADAVIST GADOBUTROL 1 MMOL/ML IV SOLN COMPARISON:  PET-CT 01/08/2022. Report from head CT 02/19/1999 (images unavailable). FINDINGS: Mild intermittent motion degradation. Brain: Mild generalized cerebral and cerebellar atrophy. Sizable focus of chronic encephalomalacia/gliosis within the anterior left frontal lobe. Associated mild chronic hemosiderin deposition at this site. Ex vacuo dilatation of the left pleural ventricle. Small chronic cortical infarct within the posteromedial right frontal lobe. Small chronic cortical/subcortical infarct within the right parietal  lobe. Background mild multifocal T2 FLAIR hyperintense signal abnormality within the cerebral white matter, nonspecific but compatible with chronic small vessel ischemic disease. There is no acute infarct. No evidence of an intracranial mass. No extra-axial fluid collection. No midline shift. No pathologic intracranial enhancement identified. Vascular: Maintained flow voids within the proximal large arterial vessels. Skull and upper cervical spine: No focal suspicious marrow lesion. Apparent left frontal burr hole. Incompletely  assessed cervical spondylosis. Degenerative appearing cyst within the upper cervical canal. Sinuses/Orbits: Visualized orbits show no acute finding. Postsurgical appearance of the paranasal sinuses. Moderate/severe mucosal thickening within the right frontal sinus. Mild mucosal thickening within the left frontal sinus. Fairly extensive T2 hyperintense opacification of remaining bilateral ethmoid air cells. Mild mucosal thickening within the right sphenoid sinus. Complete T2 hyperintense opacification of the left sphenoid sinus. Near complete T2 hyperintense opacification of the right maxillary sinus. Mild mucosal thickening within the left maxillary sinus. Additionally, both maxillary sinuses demonstrate chronic reactive osteitis. Small mucous retention cyst within the right ostiomeatal unit. Other: Small bilateral mastoid effusions. IMPRESSION: 1. Mildly motion degraded examination. 2. No evidence of intracranial metastatic disease. 3. Sizable focus of chronic encephalomalacia/gliosis within the anterior left frontal lobe. 4. Small chronic cortically-based infarcts within the posteromedial right frontal lobe and right parietal lobe. 5. Background mild chronic small vessel image changes within the cerebral white matter. 6. Mild generalized cerebral and cerebellar atrophy. 7. Postsurgical changes to the paranasal sinuses with paranasal sinus disease, as described. 8. Small bilateral mastoid effusions. Electronically Signed   By: Kellie Simmering D.O.   On: 01/23/2022 09:09     ASSESSMENT/PLAN:  This is a very pleasant 71 year old Caucasian male diagnosed with stage IIIb (T4, N2, M0) non-small cell lung cancer, squamous cell carcinoma presented with large left upper lobe lung mass in addition to left hilar and subcarinal lymphadenopathy diagnosed in February 2023.  The patient has no actionable mutation and PD-L1 expression is 90%.  The patient is currently undergoing a course of concurrent chemoradiation with  weekly carboplatin for AUC of 2 and paclitaxel 45 Mg/M2 status post 3 weeks of treatment. His last day of radiation is 03/08/22.   Labs were reviewed. Recommend that he proceed with cycle of #4 today as scheduled.   We will see the patient back for a follow up visit in 2 weeks for evaluation and repeat blood work before starting cycle #6.   Encouraged him to drink supplemental drinks for his weight loss. Encouraged him to eat small frequent meals.   The patient was advised to call immediately if she has any concerning symptoms in the interval. The patient voices understanding of current disease status and treatment options and is in agreement with the current care plan. All questions were answered. The patient knows to call the clinic with any problems, questions or concerns. We can certainly see the patient much sooner if necessary        No orders of the defined types were placed in this encounter.    The total time spent in the appointment was 20-29 minutes.   Peter Aldea L Lillia Lengel, PA-C 02/18/22

## 2022-02-14 ENCOUNTER — Ambulatory Visit
Admission: RE | Admit: 2022-02-14 | Discharge: 2022-02-14 | Disposition: A | Discharge status: Home / Self Care | Payer: PPO | Source: Ambulatory Visit | Attending: Radiation Oncology | Admitting: Radiation Oncology

## 2022-02-14 DIAGNOSIS — Z5111 Encounter for antineoplastic chemotherapy: Secondary | ICD-10-CM | POA: Diagnosis not present

## 2022-02-14 DIAGNOSIS — C3412 Malignant neoplasm of upper lobe, left bronchus or lung: Secondary | ICD-10-CM | POA: Diagnosis not present

## 2022-02-15 ENCOUNTER — Ambulatory Visit
Admission: RE | Admit: 2022-02-15 | Discharge: 2022-02-15 | Disposition: A | Discharge status: Home / Self Care | Payer: PPO | Source: Ambulatory Visit | Attending: Radiation Oncology | Admitting: Radiation Oncology

## 2022-02-15 ENCOUNTER — Other Ambulatory Visit: Payer: Self-pay

## 2022-02-15 DIAGNOSIS — Z5111 Encounter for antineoplastic chemotherapy: Secondary | ICD-10-CM | POA: Diagnosis not present

## 2022-02-15 DIAGNOSIS — C3412 Malignant neoplasm of upper lobe, left bronchus or lung: Secondary | ICD-10-CM | POA: Diagnosis not present

## 2022-02-15 MED FILL — Dexamethasone Sodium Phosphate Inj 100 MG/10ML: INTRAMUSCULAR | Qty: 1 | Status: AC

## 2022-02-18 ENCOUNTER — Other Ambulatory Visit: Payer: Self-pay

## 2022-02-18 ENCOUNTER — Encounter: Payer: Self-pay | Admitting: *Deleted

## 2022-02-18 ENCOUNTER — Inpatient Hospital Stay (HOSPITAL_BASED_OUTPATIENT_CLINIC_OR_DEPARTMENT_OTHER): Payer: PPO | Admitting: Physician Assistant

## 2022-02-18 ENCOUNTER — Ambulatory Visit
Admission: RE | Admit: 2022-02-18 | Discharge: 2022-02-18 | Disposition: A | Discharge status: Home / Self Care | Payer: PPO | Source: Ambulatory Visit | Attending: Radiation Oncology | Admitting: Radiation Oncology

## 2022-02-18 ENCOUNTER — Inpatient Hospital Stay: Payer: PPO

## 2022-02-18 VITALS — BP 111/81 | HR 95 | Temp 97.2°F | Resp 18 | Ht 68.0 in | Wt 157.2 lb

## 2022-02-18 DIAGNOSIS — C3432 Malignant neoplasm of lower lobe, left bronchus or lung: Secondary | ICD-10-CM

## 2022-02-18 DIAGNOSIS — Z5111 Encounter for antineoplastic chemotherapy: Secondary | ICD-10-CM

## 2022-02-18 DIAGNOSIS — C3412 Malignant neoplasm of upper lobe, left bronchus or lung: Secondary | ICD-10-CM | POA: Diagnosis not present

## 2022-02-18 LAB — CBC WITH DIFFERENTIAL (CANCER CENTER ONLY)
Abs Immature Granulocytes: 0.03 10*3/uL (ref 0.00–0.07)
Basophils Absolute: 0.1 10*3/uL (ref 0.0–0.1)
Basophils Relative: 1 %
Eosinophils Absolute: 0.1 10*3/uL (ref 0.0–0.5)
Eosinophils Relative: 2 %
HCT: 42.1 % (ref 39.0–52.0)
Hemoglobin: 13.2 g/dL (ref 13.0–17.0)
Immature Granulocytes: 1 %
Lymphocytes Relative: 10 %
Lymphs Abs: 0.4 10*3/uL — ABNORMAL LOW (ref 0.7–4.0)
MCH: 27 pg (ref 26.0–34.0)
MCHC: 31.4 g/dL (ref 30.0–36.0)
MCV: 86.3 fL (ref 80.0–100.0)
Monocytes Absolute: 0.4 10*3/uL (ref 0.1–1.0)
Monocytes Relative: 9 %
Neutro Abs: 3.2 10*3/uL (ref 1.7–7.7)
Neutrophils Relative %: 77 %
Platelet Count: 363 10*3/uL (ref 150–400)
RBC: 4.88 MIL/uL (ref 4.22–5.81)
RDW: 14.6 % (ref 11.5–15.5)
WBC Count: 4.3 10*3/uL (ref 4.0–10.5)
nRBC: 0 % (ref 0.0–0.2)

## 2022-02-18 LAB — CMP (CANCER CENTER ONLY)
ALT: 8 U/L (ref 0–44)
AST: 11 U/L — ABNORMAL LOW (ref 15–41)
Albumin: 3.9 g/dL (ref 3.5–5.0)
Alkaline Phosphatase: 104 U/L (ref 38–126)
Anion gap: 8 (ref 5–15)
BUN: 11 mg/dL (ref 8–23)
CO2: 26 mmol/L (ref 22–32)
Calcium: 9.3 mg/dL (ref 8.9–10.3)
Chloride: 105 mmol/L (ref 98–111)
Creatinine: 1.1 mg/dL (ref 0.61–1.24)
GFR, Estimated: >60 mL/min (ref >60)
Glucose, Bld: 94 mg/dL (ref 70–99)
Potassium: 4.4 mmol/L (ref 3.5–5.1)
Sodium: 139 mmol/L (ref 135–145)
Total Bilirubin: 0.4 mg/dL (ref 0.3–1.2)
Total Protein: 7 g/dL (ref 6.5–8.1)

## 2022-02-18 MED ORDER — DIPHENHYDRAMINE HCL 50 MG/ML IJ SOLN
50.0000 mg | Freq: Once | INTRAMUSCULAR | Status: AC
  Administered 2022-02-18: 50 mg via INTRAVENOUS
  Filled 2022-02-18: qty 1

## 2022-02-18 MED ORDER — SODIUM CHLORIDE 0.9 % IV SOLN: Freq: Once | INTRAVENOUS | Status: AC

## 2022-02-18 MED ORDER — FAMOTIDINE IN NACL 20-0.9 MG/50ML-% IV SOLN
20.0000 mg | Freq: Once | INTRAVENOUS | Status: AC
  Administered 2022-02-18: 20 mg via INTRAVENOUS
  Filled 2022-02-18: qty 50

## 2022-02-18 MED ORDER — SODIUM CHLORIDE 0.9 % IV SOLN
10.0000 mg | Freq: Once | INTRAVENOUS | Status: AC
  Administered 2022-02-18: 10 mg via INTRAVENOUS
  Filled 2022-02-18: qty 10

## 2022-02-18 MED ORDER — PALONOSETRON HCL INJECTION 0.25 MG/5ML
0.2500 mg | Freq: Once | INTRAVENOUS | Status: AC
  Administered 2022-02-18: 0.25 mg via INTRAVENOUS
  Filled 2022-02-18: qty 5

## 2022-02-18 MED ORDER — SODIUM CHLORIDE 0.9 % IV SOLN
174.4000 mg | Freq: Once | INTRAVENOUS | Status: AC
  Administered 2022-02-18: 170 mg via INTRAVENOUS
  Filled 2022-02-18: qty 17

## 2022-02-18 MED ORDER — SODIUM CHLORIDE 0.9 % IV SOLN
45.0000 mg/m2 | Freq: Once | INTRAVENOUS | Status: AC
  Administered 2022-02-18: 84 mg via INTRAVENOUS
  Filled 2022-02-18: qty 14

## 2022-02-18 NOTE — Research (Addendum)
Trial Name:  PACIFIC-9 [id number]: A Phase III, double-blind, placebo-controlled, Randomised, Multicentre, International Study of Durvalumab Plus Oleclumab and Durvalumab Plus Monalizumab in Patients With Locally Advanced (Stage III), Unresectable Non-small Cell Lung Cancer (NSCLC) Who Have Not Progressed Following Definitive, Platinum-Based Concurrent Chemoradiation Therapy   PART 1 SCREENING CONSENT  Patient Peter Diaz was identified by Dr. Arbutus Ped as a potential candidate for the above listed study.  This Clinical Research Nurse met with Peter Diaz, Peter Diaz on 02/18/22 in a manner and location that ensures patient privacy to discuss participation in the above listed research study.  Patient is Unaccompanied.  Patient was previously provided with informed consent documents for Part 1 of this study.  Patient has not yet read the informed consent documents and so documents were reviewed page by page today.  As outlined in the informed consent form, this Nurse and Peter Diaz discussed the purpose of the research study, the investigational nature of the study, study procedures and requirements for study participation, potential risks and benefits of study participation, as well as alternatives to participation.  Part 1 of this study is not blinded or double-blinded. The patient understands participation is voluntary and they may withdraw from study participation at any time.  Part 1 of this study does not involve an investigational drug or device. Part 1 of this study does not involve a placebo. Patient understands enrollment is pending full eligibility review.   Confidentiality and how the patient's information will be used as part of study participation were discussed.  Patient was informed there is reimbursement provided for their time and effort spent on trial participation.  The patient is encouraged to discuss research study participation with their insurance provider to determine what  costs they may incur as part of study participation, including research related injury.    All questions were answered to patient's satisfaction.  The informed consent and separate HIPAA Authorization was reviewed page by page.  The patient's mental and emotional status is appropriate to provide informed consent, and the patient verbalizes an understanding of study participation.  Patient has agreed to participate in the above listed research study and has voluntarily signed the informed consent Part 1 Screening master version 3.0, date 25 December 2021, IRB approved 01/30/2022 and separate HIPAA Authorization, version 5, revised 11/24/19 and IRB approved 09/18/21  on 02/18/22 at 1:15PM.  The patient was provided with a copy of the signed informed consent form and separate HIPAA Authorization for their reference.  No study specific procedures were obtained prior to the signing of the informed consent document.  Approximately 30 minutes were spent with the patient reviewing the informed consent documents.  Patient was not requested to complete a Release of Information form.  Informed Dr. Arbutus Ped that patient consented to Part I Screening of study.  Dr. Arbutus Ped confirmed that patient's last chemotherapy treatment as currently scheduled on 03/11/22 will be canceled if XRT is completed as expected prior to that date.   Domenica Reamer, BSN, RN, [employer] Research Nurse II 02/18/2022 2:03 PM

## 2022-02-18 NOTE — Patient Instructions (Signed)
[facility] MEDICAL [facility]COLOGY  Discharge Instructions: Thank you for choosing [facility] to provide your oncology and hematology care.   If you have a lab appointment with the Cancer Center, please go directly to the Cancer Center and check in at the registration area.   Wear comfortable clothing and clothing appropriate for easy access to any Portacath or PICC line.   We strive to give you quality time with your provider. You may need to reschedule your appointment if you arrive late (15 or more minutes).  Arriving late affects you and other patients whose appointments are after yours.  Also, if you miss three or more appointments without notifying the office, you may be dismissed from the clinic at the provider's discretion.      For prescription refill requests, have your pharmacy contact our office and allow 72 hours for refills to be completed.    Today you received the following chemotherapy and/or immunotherapy agents : Paclitaxel,  Carboplatin.   To help prevent nausea and vomiting after your treatment, we encourage you to take your nausea medication as directed.  BELOW ARE SYMPTOMS THAT SHOULD BE REPORTED IMMEDIATELY: *FEVER GREATER THAN 100.4 F (38 C) OR HIGHER *CHILLS OR SWEATING *NAUSEA AND VOMITING THAT IS NOT CONTROLLED WITH YOUR NAUSEA MEDICATION *UNUSUAL SHORTNESS OF BREATH *UNUSUAL BRUISING OR BLEEDING *URINARY PROBLEMS (pain or burning when urinating, or frequent urination) *BOWEL PROBLEMS (unusual diarrhea, constipation, pain near the anus) TENDERNESS IN MOUTH AND THROAT WITH OR WITHOUT PRESENCE OF ULCERS (sore throat, sores in mouth, or a toothache) UNUSUAL RASH, SWELLING OR PAIN  UNUSUAL VAGINAL DISCHARGE OR ITCHING   Items with * indicate a potential emergency and should be followed up as soon as possible or go to the Emergency Department if any problems should occur.  Please show the CHEMOTHERAPY ALERT CARD or IMMUNOTHERAPY ALERT CARD  at check-in to the Emergency Department and triage nurse.  Should you have questions after your visit or need to cancel or reschedule your appointment, please contact [facility]  Dept: [phone]  and follow the prompts.  Office hours are 8:00 a.m. to 4:30 p.m. Monday - Friday. Please note that voicemails left after 4:00 p.m. may not be returned until the following business day.  We are closed weekends and major holidays. You have access to a nurse at all times for urgent questions. Please call the main number to the clinic Dept: [phone] and follow the prompts.   For any non-urgent questions, you may also contact your provider using MyChart. We now offer e-Visits for anyone 18 and older to request care online for non-urgent symptoms. For details visit [email or web address].   Also download the MyChart app! Go to the app store, search "MyChart", open the app, select [facility], and log in with your MyChart username and password.  Due to Covid, a mask is required upon entering the hospital/clinic. If you do not have a mask, one will be given to you upon arrival. For doctor visits, patients may have 1 support person aged 18 or older with them. For treatment visits, patients cannot have anyone with them due to current Covid guidelines and our immunocompromised population.   

## 2022-02-19 ENCOUNTER — Ambulatory Visit
Admission: RE | Admit: 2022-02-19 | Discharge: 2022-02-19 | Disposition: A | Discharge status: Home / Self Care | Payer: PPO | Source: Ambulatory Visit | Attending: Radiation Oncology | Admitting: Radiation Oncology

## 2022-02-19 DIAGNOSIS — C3412 Malignant neoplasm of upper lobe, left bronchus or lung: Secondary | ICD-10-CM | POA: Diagnosis not present

## 2022-02-19 DIAGNOSIS — Z5111 Encounter for antineoplastic chemotherapy: Secondary | ICD-10-CM | POA: Diagnosis not present

## 2022-02-20 ENCOUNTER — Other Ambulatory Visit: Payer: Self-pay

## 2022-02-20 ENCOUNTER — Ambulatory Visit
Admission: RE | Admit: 2022-02-20 | Discharge: 2022-02-20 | Disposition: A | Discharge status: Home / Self Care | Payer: PPO | Source: Ambulatory Visit | Attending: Radiation Oncology | Admitting: Radiation Oncology

## 2022-02-20 DIAGNOSIS — C3412 Malignant neoplasm of upper lobe, left bronchus or lung: Secondary | ICD-10-CM | POA: Diagnosis not present

## 2022-02-20 DIAGNOSIS — Z5111 Encounter for antineoplastic chemotherapy: Secondary | ICD-10-CM | POA: Diagnosis not present

## 2022-02-21 ENCOUNTER — Ambulatory Visit
Admission: RE | Admit: 2022-02-21 | Discharge: 2022-02-21 | Disposition: A | Discharge status: Home / Self Care | Payer: PPO | Source: Ambulatory Visit | Attending: Radiation Oncology | Admitting: Radiation Oncology

## 2022-02-21 DIAGNOSIS — C3412 Malignant neoplasm of upper lobe, left bronchus or lung: Secondary | ICD-10-CM | POA: Diagnosis not present

## 2022-02-21 DIAGNOSIS — Z5111 Encounter for antineoplastic chemotherapy: Secondary | ICD-10-CM | POA: Diagnosis not present

## 2022-02-22 ENCOUNTER — Other Ambulatory Visit: Payer: Self-pay

## 2022-02-22 ENCOUNTER — Ambulatory Visit
Admission: RE | Admit: 2022-02-22 | Discharge: 2022-02-22 | Disposition: A | Discharge status: Home / Self Care | Payer: PPO | Source: Ambulatory Visit | Attending: Radiation Oncology | Admitting: Radiation Oncology

## 2022-02-22 DIAGNOSIS — C3412 Malignant neoplasm of upper lobe, left bronchus or lung: Secondary | ICD-10-CM | POA: Diagnosis not present

## 2022-02-22 DIAGNOSIS — Z5111 Encounter for antineoplastic chemotherapy: Secondary | ICD-10-CM | POA: Diagnosis not present

## 2022-02-25 ENCOUNTER — Ambulatory Visit
Admission: RE | Admit: 2022-02-25 | Discharge: 2022-02-25 | Disposition: A | Discharge status: Home / Self Care | Payer: PPO | Source: Ambulatory Visit | Attending: Radiation Oncology | Admitting: Radiation Oncology

## 2022-02-25 ENCOUNTER — Other Ambulatory Visit: Payer: Self-pay

## 2022-02-25 ENCOUNTER — Inpatient Hospital Stay: Payer: PPO

## 2022-02-25 ENCOUNTER — Other Ambulatory Visit: Payer: Self-pay | Admitting: Internal Medicine

## 2022-02-25 VITALS — BP 131/85 | HR 97 | Temp 97.6°F | Resp 18 | Wt 156.8 lb

## 2022-02-25 DIAGNOSIS — Z5111 Encounter for antineoplastic chemotherapy: Secondary | ICD-10-CM | POA: Diagnosis not present

## 2022-02-25 DIAGNOSIS — C3432 Malignant neoplasm of lower lobe, left bronchus or lung: Secondary | ICD-10-CM

## 2022-02-25 DIAGNOSIS — C3412 Malignant neoplasm of upper lobe, left bronchus or lung: Secondary | ICD-10-CM | POA: Diagnosis not present

## 2022-02-25 LAB — CBC WITH DIFFERENTIAL (CANCER CENTER ONLY)
Abs Immature Granulocytes: 0.03 10*3/uL (ref 0.00–0.07)
Basophils Absolute: 0.1 10*3/uL (ref 0.0–0.1)
Basophils Relative: 2 %
Eosinophils Absolute: 0.1 10*3/uL (ref 0.0–0.5)
Eosinophils Relative: 2 %
HCT: 40.2 % (ref 39.0–52.0)
Hemoglobin: 12.7 g/dL — ABNORMAL LOW (ref 13.0–17.0)
Immature Granulocytes: 1 %
Lymphocytes Relative: 12 %
Lymphs Abs: 0.4 10*3/uL — ABNORMAL LOW (ref 0.7–4.0)
MCH: 27.5 pg (ref 26.0–34.0)
MCHC: 31.6 g/dL (ref 30.0–36.0)
MCV: 87.2 fL (ref 80.0–100.0)
Monocytes Absolute: 0.4 10*3/uL (ref 0.1–1.0)
Monocytes Relative: 12 %
Neutro Abs: 2.2 10*3/uL (ref 1.7–7.7)
Neutrophils Relative %: 71 %
Platelet Count: 227 10*3/uL (ref 150–400)
RBC: 4.61 MIL/uL (ref 4.22–5.81)
RDW: 15.5 % (ref 11.5–15.5)
WBC Count: 3.1 10*3/uL — ABNORMAL LOW (ref 4.0–10.5)
nRBC: 0 % (ref 0.0–0.2)

## 2022-02-25 LAB — CMP (CANCER CENTER ONLY)
ALT: 12 U/L (ref 0–44)
AST: 13 U/L — ABNORMAL LOW (ref 15–41)
Albumin: 3.9 g/dL (ref 3.5–5.0)
Alkaline Phosphatase: 91 U/L (ref 38–126)
Anion gap: 7 (ref 5–15)
BUN: 11 mg/dL (ref 8–23)
CO2: 25 mmol/L (ref 22–32)
Calcium: 9.1 mg/dL (ref 8.9–10.3)
Chloride: 106 mmol/L (ref 98–111)
Creatinine: 1.02 mg/dL (ref 0.61–1.24)
GFR, Estimated: >60 mL/min (ref >60)
Glucose, Bld: 101 mg/dL — ABNORMAL HIGH (ref 70–99)
Potassium: 4.1 mmol/L (ref 3.5–5.1)
Sodium: 138 mmol/L (ref 135–145)
Total Bilirubin: 0.4 mg/dL (ref 0.3–1.2)
Total Protein: 6.9 g/dL (ref 6.5–8.1)

## 2022-02-25 MED ORDER — SODIUM CHLORIDE 0.9 % IV SOLN
10.0000 mg | Freq: Once | INTRAVENOUS | Status: AC
  Administered 2022-02-25: 10 mg via INTRAVENOUS
  Filled 2022-02-25: qty 10

## 2022-02-25 MED ORDER — PALONOSETRON HCL INJECTION 0.25 MG/5ML
0.2500 mg | Freq: Once | INTRAVENOUS | Status: AC
  Administered 2022-02-25: 0.25 mg via INTRAVENOUS
  Filled 2022-02-25: qty 5

## 2022-02-25 MED ORDER — SODIUM CHLORIDE 0.9 % IV SOLN: Freq: Once | INTRAVENOUS | Status: AC

## 2022-02-25 MED ORDER — SODIUM CHLORIDE 0.9 % IV SOLN
45.0000 mg/m2 | Freq: Once | INTRAVENOUS | Status: AC
  Administered 2022-02-25: 84 mg via INTRAVENOUS
  Filled 2022-02-25: qty 14

## 2022-02-25 MED ORDER — SODIUM CHLORIDE 0.9 % IV SOLN
174.4000 mg | Freq: Once | INTRAVENOUS | Status: AC
  Administered 2022-02-25: 170 mg via INTRAVENOUS
  Filled 2022-02-25: qty 17

## 2022-02-25 MED ORDER — DIPHENHYDRAMINE HCL 50 MG/ML IJ SOLN
50.0000 mg | Freq: Once | INTRAMUSCULAR | Status: AC
  Administered 2022-02-25: 50 mg via INTRAVENOUS
  Filled 2022-02-25: qty 1

## 2022-02-25 MED ORDER — FAMOTIDINE IN NACL 20-0.9 MG/50ML-% IV SOLN
20.0000 mg | Freq: Once | INTRAVENOUS | Status: AC
  Administered 2022-02-25: 20 mg via INTRAVENOUS
  Filled 2022-02-25: qty 50

## 2022-02-25 NOTE — Patient Instructions (Signed)
Peter Diaz   ?Discharge Instructions: ?Thank you for choosing [facility] to provide your oncology and hematology care.  ? ?If you have a lab appointment with the [facility], please go directly to the [facility] and check in at the registration area. ?  ?Wear comfortable clothing and clothing appropriate for easy access to any Portacath or PICC line.  ? ?We strive to give you quality time with your provider. You may need to reschedule your appointment if you arrive late (15 or more minutes).  Arriving late affects you and other patients whose appointments are after yours.  Also, if you miss three or more appointments without notifying the office, you may be dismissed from the clinic at the provider?s discretion.    ?  ?For prescription refill requests, have your pharmacy contact our office and allow 72 hours for refills to be completed.   ? ?Today you received the following chemotherapy and/or immunotherapy agents: paclitaxel and carboplatin    ?  ?To help prevent nausea and vomiting after your treatment, we encourage you to take your nausea medication as directed. ? ?BELOW ARE SYMPTOMS THAT SHOULD BE REPORTED IMMEDIATELY: ?*FEVER GREATER THAN 100.4 F (38 ?C) OR HIGHER ?*CHILLS OR SWEATING ?*NAUSEA AND VOMITING THAT IS NOT CONTROLLED WITH YOUR NAUSEA MEDICATION ?*UNUSUAL SHORTNESS OF BREATH ?*UNUSUAL BRUISING OR BLEEDING ?*URINARY PROBLEMS (pain or burning when urinating, or frequent urination) ?*BOWEL PROBLEMS (unusual diarrhea, constipation, pain near the anus) ?TENDERNESS IN MOUTH AND THROAT WITH OR WITHOUT PRESENCE OF ULCERS (sore throat, sores in mouth, or a toothache) ?UNUSUAL RASH, SWELLING OR PAIN  ?UNUSUAL VAGINAL DISCHARGE OR ITCHING  ? ?Items with * indicate a potential emergency and should be followed up as soon as possible or go to the Emergency Department if any problems should occur. ? ?Please show the CHEMOTHERAPY ALERT CARD or IMMUNOTHERAPY ALERT  CARD at check-in to the Emergency Department and triage nurse. ? ?Should you have questions after your visit or need to cancel or reschedule your appointment, please contact [facility]  Dept: [phone]  and follow the prompts.  Office hours are 8:00 a.m. to 4:30 p.m. Monday - Friday. Please note that voicemails left after 4:00 p.m. may not be returned until the following business day.  We are closed weekends and major holidays. You have access to a nurse at all times for urgent questions. Please call the main number to the clinic Dept: [phone] and follow the prompts. ? ? ?For any non-urgent questions, you may also contact your provider using MyChart. We now offer e-Visits for anyone 75 and older to request care online for non-urgent symptoms. For details visit [email or web address]. ?  ?Also download the MyChart app! Go to the app store, search "MyChart", open the app, select [facility], and log in with your MyChart username and password. ? ?Due to Covid, a mask is required upon entering the hospital/clinic. If you do not have a mask, one will be given to you upon arrival. For doctor visits, patients may have 1 support person aged 47 or older with them. For treatment visits, patients cannot have anyone with them due to current Covid guidelines and our immunocompromised population.  ? ?Paclitaxel injection ?What is this medication? ?PACLITAXEL (PAK li TAX el) is a chemotherapy drug. It targets fast dividing cells, like cancer cells, and causes these cells to die. This medicine is used to treat ovarian cancer, breast cancer, lung cancer, Kaposi's sarcoma, and other cancers. ?  This medicine may be used for other purposes; ask your health care provider or pharmacist if you have questions. ?COMMON BRAND NAME(S): Onxol, Taxol ?What should I tell my care team before I take this medication? ?They need to know if you have any of these conditions: ?history of irregular heartbeat ?liver  disease ?low blood counts, like low white cell, platelet, or red cell counts ?lung or breathing disease, like asthma ?tingling of the fingers or toes, or other nerve disorder ?an unusual or allergic reaction to paclitaxel, alcohol, polyoxyethylated castor oil, other chemotherapy, other medicines, foods, dyes, or preservatives ?pregnant or trying to get pregnant ?breast-feeding ?How should I use this medication? ?This drug is given as an infusion into a vein. It is administered in a hospital or clinic by a specially trained health care professional. ?Talk to your pediatrician regarding the use of this medicine in children. Special care may be needed. ?Overdosage: If you think you have taken too much of this medicine contact a poison control center or emergency room at once. ?NOTE: This medicine is only for you. Do not share this medicine with others. ?What if I miss a dose? ?It is important not to miss your dose. Call your doctor or health care professional if you are unable to keep an appointment. ?What may interact with this medication? ?Do not take this medicine with any of the following medications: ?live virus vaccines ?This medicine may also interact with the following medications: ?antiviral medicines for hepatitis, HIV or AIDS ?certain antibiotics like erythromycin and clarithromycin ?certain medicines for fungal infections like ketoconazole and itraconazole ?certain medicines for seizures like carbamazepine, phenobarbital, phenytoin ?gemfibrozil ?nefazodone ?rifampin ?St. John's wort ?This list may not describe all possible interactions. Give your health care provider a list of all the medicines, herbs, non-prescription drugs, or dietary supplements you use. Also tell them if you smoke, drink alcohol, or use illegal drugs. Some items may interact with your medicine. ?What should I watch for while using this medication? ?Your condition will be monitored carefully while you are receiving this medicine. You  will need important blood work done while you are taking this medicine. ?This medicine can cause serious allergic reactions. To reduce your risk you will need to take other medicine(s) before treatment with this medicine. If you experience allergic reactions like skin rash, itching or hives, swelling of the face, lips, or tongue, tell your doctor or health care professional right away. ?In some cases, you may be given additional medicines to help with side effects. Follow all directions for their use. ?This drug may make you feel generally unwell. This is not uncommon, as chemotherapy can affect healthy cells as well as cancer cells. Report any side effects. Continue your course of treatment even though you feel ill unless your doctor tells you to stop. ?Call your doctor or health care professional for advice if you get a fever, chills or sore throat, or other symptoms of a cold or flu. Do not treat yourself. This drug decreases your body's ability to fight infections. Try to avoid being around people who are sick. ?This medicine may increase your risk to bruise or bleed. Call your doctor or health care professional if you notice any unusual bleeding. ?Be careful brushing and flossing your teeth or using a toothpick because you may get an infection or bleed more easily. If you have any dental work done, tell your dentist you are receiving this medicine. ?Avoid taking products that contain aspirin, acetaminophen, ibuprofen, naproxen, or ketoprofen unless  instructed by your doctor. These medicines may hide a fever. ?Do not become pregnant while taking this medicine. Women should inform their doctor if they wish to become pregnant or think they might be pregnant. There is a potential for serious side effects to an unborn child. Talk to your health care professional or pharmacist for more information. Do not breast-feed an infant while taking this medicine. ?Men are advised not to father a child while receiving this  medicine. ?This product may contain alcohol. Ask your pharmacist or healthcare provider if this medicine contains alcohol. Be sure to tell all healthcare providers you are taking this medicine. Certain medi

## 2022-02-26 ENCOUNTER — Ambulatory Visit
Admission: RE | Admit: 2022-02-26 | Discharge: 2022-02-26 | Disposition: A | Discharge status: Home / Self Care | Payer: PPO | Source: Ambulatory Visit | Attending: Radiation Oncology | Admitting: Radiation Oncology

## 2022-02-26 ENCOUNTER — Encounter (HOSPITAL_COMMUNITY): Payer: Self-pay

## 2022-02-26 DIAGNOSIS — Z5111 Encounter for antineoplastic chemotherapy: Secondary | ICD-10-CM | POA: Diagnosis not present

## 2022-02-26 DIAGNOSIS — C3412 Malignant neoplasm of upper lobe, left bronchus or lung: Secondary | ICD-10-CM | POA: Diagnosis not present

## 2022-02-27 ENCOUNTER — Other Ambulatory Visit: Payer: Self-pay

## 2022-02-27 ENCOUNTER — Ambulatory Visit
Admission: RE | Admit: 2022-02-27 | Discharge: 2022-02-27 | Disposition: A | Discharge status: Home / Self Care | Payer: PPO | Source: Ambulatory Visit | Attending: Radiation Oncology | Admitting: Radiation Oncology

## 2022-02-27 DIAGNOSIS — C3412 Malignant neoplasm of upper lobe, left bronchus or lung: Secondary | ICD-10-CM | POA: Diagnosis not present

## 2022-02-27 DIAGNOSIS — Z5111 Encounter for antineoplastic chemotherapy: Secondary | ICD-10-CM | POA: Diagnosis not present

## 2022-02-28 ENCOUNTER — Ambulatory Visit
Admission: RE | Admit: 2022-02-28 | Discharge: 2022-02-28 | Disposition: A | Discharge status: Home / Self Care | Payer: PPO | Source: Ambulatory Visit | Attending: Radiation Oncology | Admitting: Radiation Oncology

## 2022-02-28 ENCOUNTER — Encounter: Payer: Self-pay | Admitting: *Deleted

## 2022-02-28 DIAGNOSIS — Z5111 Encounter for antineoplastic chemotherapy: Secondary | ICD-10-CM | POA: Diagnosis not present

## 2022-02-28 DIAGNOSIS — C3412 Malignant neoplasm of upper lobe, left bronchus or lung: Secondary | ICD-10-CM | POA: Diagnosis not present

## 2022-03-01 ENCOUNTER — Ambulatory Visit
Admission: RE | Admit: 2022-03-01 | Discharge: 2022-03-01 | Disposition: A | Discharge status: Home / Self Care | Payer: PPO | Source: Ambulatory Visit | Attending: Radiation Oncology | Admitting: Radiation Oncology

## 2022-03-01 ENCOUNTER — Encounter: Payer: Self-pay | Admitting: Internal Medicine

## 2022-03-01 ENCOUNTER — Other Ambulatory Visit: Payer: Self-pay

## 2022-03-01 ENCOUNTER — Telehealth: Payer: Self-pay | Admitting: Internal Medicine

## 2022-03-01 DIAGNOSIS — C3412 Malignant neoplasm of upper lobe, left bronchus or lung: Secondary | ICD-10-CM | POA: Diagnosis not present

## 2022-03-01 DIAGNOSIS — Z5111 Encounter for antineoplastic chemotherapy: Secondary | ICD-10-CM | POA: Diagnosis not present

## 2022-03-01 NOTE — Progress Notes (Signed)
PACIFIC-9 [id number]:   Research nurse was notified by Peggye Pitt, Lab assistant at [facility] Pathology Department that patient's tissue was received back from [facility] and the Pathologist determined there is insufficient tissue left to meet the study criteria for eligibility.  Patient will be screen failed for this study due to insufficient tissue.  Dr. Julien Nordmann was notified by staff message.  Patient is scheduled to see Dr. Julien Nordmann in clinic on Monday 03/04/22 and will be informed that he is not eligible to participate in this study.  ?Foye Spurling, BSN, RN, CCRP ?Clinical Research Nurse II ? ?

## 2022-03-01 NOTE — Telephone Encounter (Signed)
Called patient regarding upcoming 03/27 appointment, patient is notified. ?

## 2022-03-04 ENCOUNTER — Other Ambulatory Visit: Payer: Self-pay

## 2022-03-04 ENCOUNTER — Ambulatory Visit
Admission: RE | Admit: 2022-03-04 | Discharge: 2022-03-04 | Disposition: A | Discharge status: Home / Self Care | Payer: PPO | Source: Ambulatory Visit | Attending: Radiation Oncology | Admitting: Radiation Oncology

## 2022-03-04 ENCOUNTER — Inpatient Hospital Stay: Payer: PPO

## 2022-03-04 ENCOUNTER — Encounter: Payer: Self-pay | Admitting: Internal Medicine

## 2022-03-04 ENCOUNTER — Inpatient Hospital Stay (HOSPITAL_BASED_OUTPATIENT_CLINIC_OR_DEPARTMENT_OTHER): Payer: PPO | Admitting: Internal Medicine

## 2022-03-04 VITALS — BP 113/74 | HR 68 | Temp 97.2°F | Resp 18 | Ht 68.0 in | Wt 153.2 lb

## 2022-03-04 DIAGNOSIS — C3432 Malignant neoplasm of lower lobe, left bronchus or lung: Secondary | ICD-10-CM

## 2022-03-04 DIAGNOSIS — C3412 Malignant neoplasm of upper lobe, left bronchus or lung: Secondary | ICD-10-CM | POA: Diagnosis not present

## 2022-03-04 DIAGNOSIS — C3490 Malignant neoplasm of unspecified part of unspecified bronchus or lung: Secondary | ICD-10-CM

## 2022-03-04 DIAGNOSIS — Z5111 Encounter for antineoplastic chemotherapy: Secondary | ICD-10-CM | POA: Diagnosis not present

## 2022-03-04 LAB — CBC WITH DIFFERENTIAL (CANCER CENTER ONLY)
Abs Immature Granulocytes: 0.02 10*3/uL (ref 0.00–0.07)
Basophils Absolute: 0 10*3/uL (ref 0.0–0.1)
Basophils Relative: 1 %
Eosinophils Absolute: 0 10*3/uL (ref 0.0–0.5)
Eosinophils Relative: 1 %
HCT: 41.9 % (ref 39.0–52.0)
Hemoglobin: 13.2 g/dL (ref 13.0–17.0)
Immature Granulocytes: 1 %
Lymphocytes Relative: 11 %
Lymphs Abs: 0.4 10*3/uL — ABNORMAL LOW (ref 0.7–4.0)
MCH: 27.4 pg (ref 26.0–34.0)
MCHC: 31.5 g/dL (ref 30.0–36.0)
MCV: 87.1 fL (ref 80.0–100.0)
Monocytes Absolute: 0.4 10*3/uL (ref 0.1–1.0)
Monocytes Relative: 13 %
Neutro Abs: 2.4 10*3/uL (ref 1.7–7.7)
Neutrophils Relative %: 73 %
Platelet Count: 189 10*3/uL (ref 150–400)
RBC: 4.81 MIL/uL (ref 4.22–5.81)
RDW: 16.5 % — ABNORMAL HIGH (ref 11.5–15.5)
WBC Count: 3.3 10*3/uL — ABNORMAL LOW (ref 4.0–10.5)
nRBC: 0 % (ref 0.0–0.2)

## 2022-03-04 LAB — CMP (CANCER CENTER ONLY)
ALT: 9 U/L (ref 0–44)
AST: 10 U/L — ABNORMAL LOW (ref 15–41)
Albumin: 4 g/dL (ref 3.5–5.0)
Alkaline Phosphatase: 101 U/L (ref 38–126)
Anion gap: 8 (ref 5–15)
BUN: 15 mg/dL (ref 8–23)
CO2: 26 mmol/L (ref 22–32)
Calcium: 9.1 mg/dL (ref 8.9–10.3)
Chloride: 104 mmol/L (ref 98–111)
Creatinine: 1.2 mg/dL (ref 0.61–1.24)
GFR, Estimated: >60 mL/min (ref >60)
Glucose, Bld: 100 mg/dL — ABNORMAL HIGH (ref 70–99)
Potassium: 4 mmol/L (ref 3.5–5.1)
Sodium: 138 mmol/L (ref 135–145)
Total Bilirubin: 0.6 mg/dL (ref 0.3–1.2)
Total Protein: 7 g/dL (ref 6.5–8.1)

## 2022-03-04 MED ORDER — HYDROCODONE-ACETAMINOPHEN 10-325 MG/15ML PO SOLN
ORAL | 0 refills | Status: DC
Start: 2022-03-04 — End: 2022-03-05

## 2022-03-04 MED ORDER — SODIUM CHLORIDE 0.9 % IV SOLN
10.0000 mg | Freq: Once | INTRAVENOUS | Status: AC
  Administered 2022-03-04: 10 mg via INTRAVENOUS
  Filled 2022-03-04: qty 10

## 2022-03-04 MED ORDER — FAMOTIDINE IN NACL 20-0.9 MG/50ML-% IV SOLN
20.0000 mg | Freq: Once | INTRAVENOUS | Status: AC
  Administered 2022-03-04: 20 mg via INTRAVENOUS
  Filled 2022-03-04: qty 50

## 2022-03-04 MED ORDER — SODIUM CHLORIDE 0.9 % IV SOLN
170.0000 mg | Freq: Once | INTRAVENOUS | Status: AC
  Administered 2022-03-04: 170 mg via INTRAVENOUS
  Filled 2022-03-04: qty 17

## 2022-03-04 MED ORDER — PALONOSETRON HCL INJECTION 0.25 MG/5ML
0.2500 mg | Freq: Once | INTRAVENOUS | Status: AC
  Administered 2022-03-04: 0.25 mg via INTRAVENOUS
  Filled 2022-03-04: qty 5

## 2022-03-04 MED ORDER — SODIUM CHLORIDE 0.9 % IV SOLN
45.0000 mg/m2 | Freq: Once | INTRAVENOUS | Status: AC
  Administered 2022-03-04: 84 mg via INTRAVENOUS
  Filled 2022-03-04: qty 14

## 2022-03-04 MED ORDER — SUCRALFATE 1 G PO TABS: 1.0000 g | ORAL_TABLET | Freq: Three times a day (TID) | ORAL | 1 refills | Status: DC

## 2022-03-04 MED ORDER — SODIUM CHLORIDE 0.9 % IV SOLN: Freq: Once | INTRAVENOUS | Status: AC

## 2022-03-04 MED ORDER — SODIUM CHLORIDE 0.9 % IV SOLN: INTRAVENOUS | Status: AC

## 2022-03-04 MED ORDER — DIPHENHYDRAMINE HCL 50 MG/ML IJ SOLN
50.0000 mg | Freq: Once | INTRAMUSCULAR | Status: AC
  Administered 2022-03-04: 50 mg via INTRAVENOUS
  Filled 2022-03-04: qty 1

## 2022-03-04 NOTE — Progress Notes (Signed)
?    [facility] ?[phone]   [phone] ? ?OFFICE PROGRESS NOTE ? ?Shirline Frees, MD ?[address] ?[place] [address] ? ?DIAGNOSIS: Stage IIIb (T4, N2, M0) non-small cell lung cancer, squamous cell carcinoma presented with large left upper lobe lung mass with left hilar and subcarinal lymphadenopathy diagnosed in February 2023. ? ?Molecular study by [employer] ?DETECTED ALTERATION(S) / BIOMARKER(S) % CFDNA OR AMPLIFICATION ASSOCIATED FDA-APPROVED THERAPIES CLINICAL TRIAL AVAILABILITY ?[id number] ?4.4% ?None  ?Yes ?[id number] ?1.1% ?None  ?Yes ? ?PDL1 Expression 90% ? ?PRIOR THERAPY:None ? ?CURRENT THERAPY: A course of concurrent chemoradiation with weekly carboplatin for AUC of 2 and paclitaxel 45 Mg/M2.  Status post 5 cycles. ? ?INTERVAL HISTORY: ?Peter Diaz 71 y.o. male returns to the clinic today for follow-up visit.  The patient is feeling fine except for increasing fatigue and weakness as well as the odynophagia.  He lost 4 pounds since his last visit.  He takes only Nexium for acid reflux.  He denied having any current chest pain, shortness of breath, cough or hemoptysis.  He denied having any fever or chills.  He has no nausea, vomiting, diarrhea or constipation.  He has no headache or visual changes.  The patient is not a candidate for the specific 9 clinical trial because of lack of sufficient tissue for the additional studies.  He is here today for evaluation before the start of the last cycle of his concurrent chemoradiation. ? ?MEDICAL HISTORY: ?Past Medical History:  ?Diagnosis Date  ? GERD (gastroesophageal reflux disease)   ? Gout   ? HTN (hypertension)   ? ? ?ALLERGIES:  has No Known Allergies. ? ?MEDICATIONS:  ?Current Outpatient Medications  ?Medication Sig Dispense Refill  ? albuterol (VENTOLIN HFA) 108 (90 Base) MCG/ACT inhaler Inhale 2 puffs into the lungs every 6 (six) hours as needed for wheezing or shortness of breath. (Patient  not taking: Reported on 01/21/2022) 8 g 6  ? amLODipine (NORVASC) 10 MG tablet Take 10 mg by mouth daily.    ? esomeprazole (NEXIUM) 20 MG capsule Take 20 mg by mouth daily at 12 noon.    ? ibuprofen (ADVIL) 200 MG tablet Take 200 mg by mouth as needed.    ? prochlorperazine (COMPAZINE) 10 MG tablet Take 1 tablet (10 mg total) by mouth every 6 (six) hours as needed for nausea or vomiting. (Patient not taking: Reported on 01/21/2022) 30 tablet 0  ? tamsulosin (FLOMAX) 0.4 MG CAPS capsule Take 0.4 mg by mouth at bedtime.    ? ?No current facility-administered medications for this visit.  ? ? ?SURGICAL HISTORY:  ?Past Surgical History:  ?Procedure Laterality Date  ? BIOPSY  01/11/2022  ? Procedure: BIOPSY;  Surgeon: Garner Nash, DO;  Location: [facility];  Service: Pulmonary;;  ? BRAIN SURGERY    ? BRONCHIAL BRUSHINGS  01/11/2022  ? Procedure: BRONCHIAL BRUSHINGS;  Surgeon: Garner Nash, DO;  Location: [facility];  Service: Pulmonary;;  ? BRONCHIAL NEEDLE ASPIRATION BIOPSY  01/11/2022  ? Procedure: BRONCHIAL NEEDLE ASPIRATION BIOPSIES;  Surgeon: Garner Nash, DO;  Location: [facility];  Service: Pulmonary;;  ? VIDEO BRONCHOSCOPY WITH ENDOBRONCHIAL ULTRASOUND Left 01/11/2022  ? Procedure: VIDEO BRONCHOSCOPY WITH ENDOBRONCHIAL ULTRASOUND;  Surgeon: Garner Nash, DO;  Location: [facility];  Service: Pulmonary;  Laterality: Left;  ? ? ?REVIEW OF SYSTEMS:  Constitutional: positive for fatigue ?Eyes: negative ?Ears, nose, mouth, throat, and face: negative ?Respiratory: negative ?Cardiovascular: negative ?Gastrointestinal: positive for  odynophagia ?Genitourinary:negative ?Integument/breast: negative ?Hematologic/lymphatic: negative ?Musculoskeletal:negative ?Neurological: negative ?Behavioral/Psych: negative ?Endocrine: negative ?Allergic/Immunologic: negative  ? ?PHYSICAL EXAMINATION: General appearance: alert, cooperative, fatigued, and no distress ?Head: Normocephalic, without obvious abnormality,  atraumatic ?Neck: no adenopathy, no JVD, supple, symmetrical, trachea midline, and thyroid not enlarged, symmetric, no tenderness/mass/nodules ?Lymph nodes: Cervical, supraclavicular, and axillary nodes normal. ?Resp: clear to auscultation bilaterally ?Back: symmetric, no curvature. ROM normal. No CVA tenderness. ?Cardio: regular rate and rhythm, S1, S2 normal, no murmur, click, rub or gallop ?GI: soft, non-tender; bowel sounds normal; no masses,  no organomegaly ?Extremities: extremities normal, atraumatic, no cyanosis or edema ?Neurologic: Alert and oriented X 3, normal strength and tone. Normal symmetric reflexes. Normal coordination and gait ? ?ECOG PERFORMANCE STATUS: 1 - Symptomatic but completely ambulatory ? ?Blood pressure 113/74, pulse 68, temperature (!) 97.2 ?F (36.2 ?C), temperature source Tympanic, resp. rate 18, height _0  (1.727 m), weight 153 lb 4 oz (69.5 kg), SpO2 100 %. ? ?LABORATORY DATA: ?Lab Results  ?Component Value Date  ? WBC 3.3 (L) 03/04/2022  ? HGB 13.2 03/04/2022  ? HCT 41.9 03/04/2022  ? MCV 87.1 03/04/2022  ? PLT 189 03/04/2022  ? ? ?  Chemistry   ?   ?Component Value Date/Time  ? NA 138 02/25/2022 1203  ? K 4.1 02/25/2022 1203  ? CL 106 02/25/2022 1203  ? CO2 25 02/25/2022 1203  ? BUN 11 02/25/2022 1203  ? CREATININE 1.02 02/25/2022 1203  ?    ?Component Value Date/Time  ? CALCIUM 9.1 02/25/2022 1203  ? ALKPHOS 91 02/25/2022 1203  ? AST 13 (L) 02/25/2022 1203  ? ALT 12 02/25/2022 1203  ? BILITOT 0.4 02/25/2022 1203  ?  ? ? ? ?RADIOGRAPHIC STUDIES: ?No results found. ? ?ASSESSMENT AND PLAN: This is a very pleasant 71 years old white male diagnosed with stage IIIb (T4, N2, M0) non-small cell lung cancer, squamous cell carcinoma presented with large left upper lobe lung mass in addition to left hilar and subcarinal lymphadenopathy diagnosed in February 2023.  The patient has no actionable mutation and PD-L1 expression is 90%. ?He had MRI of the brain performed recently that showed no  concerning findings for disease metastasis to the brain. ?The patient is currently undergoing a course of concurrent chemoradiation with weekly carboplatin for AUC of 2 and paclitaxel 45 Mg/M2 status post 5 week of treatment. 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 ?I recommended for the patient to proceed with the last cycle of his treatment today as planned. ?I will arrange for him to come back for follow-up visit in 1 months for evaluation with repeat CT scan of the chest for restaging of his disease. ?For the radiation-induced esophagitis and odynophagia, I will start the patient on Carafate 1 g p.o. 3 times daily and also nightly.  I will also start the patient on pain medication with hydrocodone liquid 10/325 mg every 8 hours as needed for pain. ?For the dehydration, I will arrange for the patient to receive 1 L of normal saline in the clinic today. ?The patient was advised to call immediately if he has any concerning symptoms in the interval. ?The patient voices understanding of current disease status and treatment options and is in agreement with the current care plan. ? ?All questions were answered. The patient knows to call the clinic with any problems, questions or concerns. We can certainly see the patient much  sooner if necessary. ? ?The total time spent in the appointment was 35 minutes. ? ?Disclaimer: This note was dictated with voice recognition software. Similar sounding words can inadvertently be transcribed and may not be corrected upon review. ? ? ?  ?   ?

## 2022-03-04 NOTE — Patient Instructions (Signed)
[facility]COL[facility]  Discharge Instructions: Thank you for choosing [facility] to provide your oncology and hematology care.   If you have a lab appointment with the Cancer Center, please go directly to the Cancer Center and check in at the registration area.   Wear comfortable clothing and clothing appropriate for easy access to any Portacath or PICC line.   We strive to give you quality time with your provider. You may need to reschedule your appointment if you arrive late (15 or more minutes).  Arriving late affects you and other patients whose appointments are after yours.  Also, if you miss three or more appointments without notifying the office, you may be dismissed from the clinic at the provider's discretion.      For prescription refill requests, have your pharmacy contact our office and allow 72 hours for refills to be completed.    Today you received the following chemotherapy and/or immunotherapy agents: Paclitaxel (Taxol) and Carboplatin.   To help prevent nausea and vomiting after your treatment, we encourage you to take your nausea medication as directed.  BELOW ARE SYMPTOMS THAT SHOULD BE REPORTED IMMEDIATELY: *FEVER GREATER THAN 100.4 F (38 C) OR HIGHER *CHILLS OR SWEATING *NAUSEA AND VOMITING THAT IS NOT CONTROLLED WITH YOUR NAUSEA MEDICATION *UNUSUAL SHORTNESS OF BREATH *UNUSUAL BRUISING OR BLEEDING *URINARY PROBLEMS (pain or burning when urinating, or frequent urination) *BOWEL PROBLEMS (unusual diarrhea, constipation, pain near the anus) TENDERNESS IN MOUTH AND THROAT WITH OR WITHOUT PRESENCE OF ULCERS (sore throat, sores in mouth, or a toothache) UNUSUAL RASH, SWELLING OR PAIN  UNUSUAL VAGINAL DISCHARGE OR ITCHING   Items with * indicate a potential emergency and should be followed up as soon as possible or go to the Emergency Department if any problems should occur.  Please show the CHEMOTHERAPY ALERT CARD or IMMUNOTHERAPY  ALERT CARD at check-in to the Emergency Department and triage nurse.  Should you have questions after your visit or need to cancel or reschedule your appointment, please contact [facility]  Dept: [phone]  and follow the prompts.  Office hours are 8:00 a.m. to 4:30 p.m. Monday - Friday. Please note that voicemails left after 4:00 p.m. may not be returned until the following business day.  We are closed weekends and major holidays. You have access to a nurse at all times for urgent questions. Please call the main number to the clinic Dept: [phone] and follow the prompts.   For any non-urgent questions, you may also contact your provider using MyChart. We now offer e-Visits for anyone 18 and older to request care online for non-urgent symptoms. For details visit [email or web address].   Also download the MyChart app! Go to the app store, search "MyChart", open the app, select , and log in with your MyChart username and password.  Due to Covid, a mask is required upon entering the hospital/clinic. If you do not have a mask, one will be given to you upon arrival. For doctor visits, patients may have 1 support person aged 18 or older with them. For treatment visits, patients cannot have anyone with them due to current Covid guidelines and our immunocompromised population.   

## 2022-03-05 ENCOUNTER — Encounter: Payer: Self-pay | Admitting: Internal Medicine

## 2022-03-05 ENCOUNTER — Ambulatory Visit
Admission: RE | Admit: 2022-03-05 | Discharge: 2022-03-05 | Disposition: A | Discharge status: Home / Self Care | Payer: PPO | Source: Ambulatory Visit | Attending: Radiation Oncology | Admitting: Radiation Oncology

## 2022-03-05 ENCOUNTER — Other Ambulatory Visit: Payer: Self-pay | Admitting: Internal Medicine

## 2022-03-05 ENCOUNTER — Telehealth: Payer: Self-pay | Admitting: *Deleted

## 2022-03-05 ENCOUNTER — Other Ambulatory Visit (HOSPITAL_COMMUNITY): Payer: Self-pay

## 2022-03-05 DIAGNOSIS — C3412 Malignant neoplasm of upper lobe, left bronchus or lung: Secondary | ICD-10-CM | POA: Diagnosis not present

## 2022-03-05 DIAGNOSIS — Z5111 Encounter for antineoplastic chemotherapy: Secondary | ICD-10-CM | POA: Diagnosis not present

## 2022-03-05 MED ORDER — HYDROCODONE-ACETAMINOPHEN 7.5-325 MG/15ML PO SOLN
15.0000 mL | Freq: Four times a day (QID) | ORAL | 0 refills | Status: DC | PRN
  Filled 2022-03-05 – 2022-03-07 (×2): qty 120, 2d supply, fill #0

## 2022-03-05 NOTE — Telephone Encounter (Signed)
Notified by [facility] staff that patient unable to fill Rx for hydrocodone-acetaminophen 10/325 mg/15 ml at [facility] with other prescriptions.  ?Staff provided information that they had contacted several local commercial pharmacies and product not available.They contacted [facility] and found they have hydrocodone-acetaminophen 7.5/325 mg/15 ml.  ?Contacted patient to inform that med available at [facility] OP - patient in agreement to have new RX sent there. ?Dr. Julien Nordmann informed. New Rx sent to [facility] pharm at strength available.  ?

## 2022-03-06 ENCOUNTER — Other Ambulatory Visit: Payer: Self-pay

## 2022-03-06 ENCOUNTER — Ambulatory Visit
Admission: RE | Admit: 2022-03-06 | Discharge: 2022-03-06 | Disposition: A | Discharge status: Home / Self Care | Payer: PPO | Source: Ambulatory Visit | Attending: Radiation Oncology | Admitting: Radiation Oncology

## 2022-03-06 DIAGNOSIS — Z5111 Encounter for antineoplastic chemotherapy: Secondary | ICD-10-CM | POA: Diagnosis not present

## 2022-03-06 DIAGNOSIS — C3412 Malignant neoplasm of upper lobe, left bronchus or lung: Secondary | ICD-10-CM | POA: Diagnosis not present

## 2022-03-07 ENCOUNTER — Other Ambulatory Visit (HOSPITAL_COMMUNITY): Payer: Self-pay

## 2022-03-07 ENCOUNTER — Ambulatory Visit
Admission: RE | Admit: 2022-03-07 | Discharge: 2022-03-07 | Disposition: A | Discharge status: Home / Self Care | Payer: PPO | Source: Ambulatory Visit | Attending: Radiation Oncology | Admitting: Radiation Oncology

## 2022-03-07 DIAGNOSIS — C3412 Malignant neoplasm of upper lobe, left bronchus or lung: Secondary | ICD-10-CM | POA: Diagnosis not present

## 2022-03-07 DIAGNOSIS — Z5111 Encounter for antineoplastic chemotherapy: Secondary | ICD-10-CM | POA: Diagnosis not present

## 2022-03-08 ENCOUNTER — Ambulatory Visit
Admission: RE | Admit: 2022-03-08 | Discharge: 2022-03-08 | Disposition: A | Discharge status: Home / Self Care | Payer: PPO | Source: Ambulatory Visit | Attending: Radiation Oncology | Admitting: Radiation Oncology

## 2022-03-08 ENCOUNTER — Encounter: Payer: Self-pay | Admitting: Radiation Oncology

## 2022-03-08 ENCOUNTER — Other Ambulatory Visit: Payer: Self-pay

## 2022-03-08 DIAGNOSIS — C3412 Malignant neoplasm of upper lobe, left bronchus or lung: Secondary | ICD-10-CM | POA: Diagnosis not present

## 2022-03-08 DIAGNOSIS — Z5111 Encounter for antineoplastic chemotherapy: Secondary | ICD-10-CM | POA: Diagnosis not present

## 2022-03-11 ENCOUNTER — Other Ambulatory Visit: Payer: PPO

## 2022-03-11 ENCOUNTER — Ambulatory Visit: Payer: PPO

## 2022-03-22 ENCOUNTER — Telehealth: Payer: Self-pay | Admitting: *Deleted

## 2022-03-22 NOTE — Telephone Encounter (Signed)
Called patient to ask about rescheduling fu appt. on 04-04-22 due to Dr. Sondra Come being on vacation, spoke with patient and he agreed to come on May 8 @ 11:45 am ?

## 2022-03-25 ENCOUNTER — Telehealth: Payer: Self-pay | Admitting: *Deleted

## 2022-03-25 NOTE — Telephone Encounter (Signed)
[facility] 9 Study: Called patient to inform him to expect $90 stipend check in the mail for participating in the consent process/visit for this study.  Discussed he is not eligible for the study due to not enough tissue from his biopsy available to send to the study. He will continue on treatment as usual without being on a study. Patient verbalized understanding. Thanked patient for his time and willingness to participate in this study.  ?Foye Spurling, BSN, RN, CCRP ?Clinical Research Nurse II ?03/25/2022 10:58 AM ? ?

## 2022-03-27 ENCOUNTER — Telehealth: Payer: Self-pay | Admitting: Internal Medicine

## 2022-03-27 NOTE — Telephone Encounter (Signed)
Called patient regarding upcoming appointments, left a voicemail. 

## 2022-04-04 ENCOUNTER — Inpatient Hospital Stay: Payer: PPO | Attending: Internal Medicine

## 2022-04-04 ENCOUNTER — Ambulatory Visit (HOSPITAL_COMMUNITY)
Admission: RE | Admit: 2022-04-04 | Discharge: 2022-04-04 | Disposition: A | Discharge status: Home / Self Care | Payer: PPO | Source: Ambulatory Visit | Attending: Internal Medicine | Admitting: Internal Medicine

## 2022-04-04 ENCOUNTER — Other Ambulatory Visit: Payer: Self-pay

## 2022-04-04 ENCOUNTER — Ambulatory Visit: Payer: PPO | Admitting: Radiation Oncology

## 2022-04-04 ENCOUNTER — Encounter (HOSPITAL_COMMUNITY): Payer: Self-pay

## 2022-04-04 DIAGNOSIS — Z510 Encounter for antineoplastic radiation therapy: Secondary | ICD-10-CM | POA: Diagnosis not present

## 2022-04-04 DIAGNOSIS — J439 Emphysema, unspecified: Secondary | ICD-10-CM | POA: Diagnosis not present

## 2022-04-04 DIAGNOSIS — C3490 Malignant neoplasm of unspecified part of unspecified bronchus or lung: Secondary | ICD-10-CM | POA: Diagnosis not present

## 2022-04-04 DIAGNOSIS — I2510 Atherosclerotic heart disease of native coronary artery without angina pectoris: Secondary | ICD-10-CM | POA: Diagnosis not present

## 2022-04-04 DIAGNOSIS — C3432 Malignant neoplasm of lower lobe, left bronchus or lung: Secondary | ICD-10-CM | POA: Insufficient documentation

## 2022-04-04 DIAGNOSIS — I700 Atherosclerosis of aorta: Secondary | ICD-10-CM | POA: Diagnosis not present

## 2022-04-04 LAB — CMP (CANCER CENTER ONLY)
ALT: 8 U/L (ref 0–44)
AST: 9 U/L — ABNORMAL LOW (ref 15–41)
Albumin: 3.9 g/dL (ref 3.5–5.0)
Alkaline Phosphatase: 103 U/L (ref 38–126)
Anion gap: 3 — ABNORMAL LOW (ref 5–15)
BUN: 13 mg/dL (ref 8–23)
CO2: 30 mmol/L (ref 22–32)
Calcium: 9.3 mg/dL (ref 8.9–10.3)
Chloride: 107 mmol/L (ref 98–111)
Creatinine: 1.14 mg/dL (ref 0.61–1.24)
GFR, Estimated: >60 mL/min (ref >60)
Glucose, Bld: 100 mg/dL — ABNORMAL HIGH (ref 70–99)
Potassium: 3.9 mmol/L (ref 3.5–5.1)
Sodium: 140 mmol/L (ref 135–145)
Total Bilirubin: 0.4 mg/dL (ref 0.3–1.2)
Total Protein: 6.9 g/dL (ref 6.5–8.1)

## 2022-04-04 LAB — CBC WITH DIFFERENTIAL (CANCER CENTER ONLY)
Abs Immature Granulocytes: 0.04 10*3/uL (ref 0.00–0.07)
Basophils Absolute: 0.1 10*3/uL (ref 0.0–0.1)
Basophils Relative: 1 %
Eosinophils Absolute: 0.1 10*3/uL (ref 0.0–0.5)
Eosinophils Relative: 1 %
HCT: 42.1 % (ref 39.0–52.0)
Hemoglobin: 14.1 g/dL (ref 13.0–17.0)
Immature Granulocytes: 1 %
Lymphocytes Relative: 11 %
Lymphs Abs: 0.8 10*3/uL (ref 0.7–4.0)
MCH: 30.8 pg (ref 26.0–34.0)
MCHC: 33.5 g/dL (ref 30.0–36.0)
MCV: 91.9 fL (ref 80.0–100.0)
Monocytes Absolute: 0.8 10*3/uL (ref 0.1–1.0)
Monocytes Relative: 12 %
Neutro Abs: 5.6 10*3/uL (ref 1.7–7.7)
Neutrophils Relative %: 74 %
Platelet Count: 308 10*3/uL (ref 150–400)
RBC: 4.58 MIL/uL (ref 4.22–5.81)
RDW: 18.9 % — ABNORMAL HIGH (ref 11.5–15.5)
WBC Count: 7.3 10*3/uL (ref 4.0–10.5)
nRBC: 0 % (ref 0.0–0.2)

## 2022-04-04 MED ORDER — IOHEXOL 300 MG/ML  SOLN
100.0000 mL | Freq: Once | INTRAMUSCULAR | Status: AC | PRN
  Administered 2022-04-04: 75 mL via INTRAVENOUS

## 2022-04-04 MED ORDER — SODIUM CHLORIDE (PF) 0.9 % IJ SOLN
INTRAMUSCULAR | Status: AC
  Filled 2022-04-04: qty 50

## 2022-04-05 ENCOUNTER — Telehealth: Payer: Self-pay | Admitting: Internal Medicine

## 2022-04-05 NOTE — Telephone Encounter (Signed)
Called patient regarding upcoming appointment, patient is notified. °

## 2022-04-08 ENCOUNTER — Inpatient Hospital Stay: Payer: PPO | Attending: Internal Medicine | Admitting: Internal Medicine

## 2022-04-08 ENCOUNTER — Other Ambulatory Visit: Payer: Self-pay

## 2022-04-08 ENCOUNTER — Encounter: Payer: Self-pay | Admitting: *Deleted

## 2022-04-08 VITALS — BP 128/89 | HR 97 | Temp 97.0°F | Resp 18 | Wt 155.2 lb

## 2022-04-08 DIAGNOSIS — Z79899 Other long term (current) drug therapy: Secondary | ICD-10-CM | POA: Insufficient documentation

## 2022-04-08 DIAGNOSIS — I700 Atherosclerosis of aorta: Secondary | ICD-10-CM | POA: Diagnosis not present

## 2022-04-08 DIAGNOSIS — Z5112 Encounter for antineoplastic immunotherapy: Secondary | ICD-10-CM

## 2022-04-08 DIAGNOSIS — J439 Emphysema, unspecified: Secondary | ICD-10-CM | POA: Diagnosis not present

## 2022-04-08 DIAGNOSIS — C3432 Malignant neoplasm of lower lobe, left bronchus or lung: Secondary | ICD-10-CM | POA: Diagnosis not present

## 2022-04-08 DIAGNOSIS — Z923 Personal history of irradiation: Secondary | ICD-10-CM | POA: Insufficient documentation

## 2022-04-08 DIAGNOSIS — C3492 Malignant neoplasm of unspecified part of left bronchus or lung: Secondary | ICD-10-CM | POA: Diagnosis not present

## 2022-04-08 DIAGNOSIS — Z9221 Personal history of antineoplastic chemotherapy: Secondary | ICD-10-CM | POA: Diagnosis not present

## 2022-04-08 DIAGNOSIS — I10 Essential (primary) hypertension: Secondary | ICD-10-CM | POA: Diagnosis not present

## 2022-04-08 DIAGNOSIS — N401 Enlarged prostate with lower urinary tract symptoms: Secondary | ICD-10-CM | POA: Diagnosis not present

## 2022-04-08 DIAGNOSIS — C7931 Secondary malignant neoplasm of brain: Secondary | ICD-10-CM | POA: Diagnosis not present

## 2022-04-08 DIAGNOSIS — Z23 Encounter for immunization: Secondary | ICD-10-CM | POA: Diagnosis not present

## 2022-04-08 NOTE — Progress Notes (Signed)
?    [facility] ?[phone]   [phone] ? ?OFFICE PROGRESS NOTE ? ?Shirline Frees, MD ?[address] ?[place] [address] ? ?DIAGNOSIS: Stage IIIb (T4, N2, M0) non-small cell lung cancer, squamous cell carcinoma presented with large left upper lobe lung mass with left hilar and subcarinal lymphadenopathy diagnosed in February 2023. ? ?Molecular study by [employer] ?DETECTED ALTERATION(S) / BIOMARKER(S) % CFDNA OR AMPLIFICATION ASSOCIATED FDA-APPROVED THERAPIES CLINICAL TRIAL AVAILABILITY ?[id number] ?4.4% ?None  ?Yes ?[id number] ?1.1% ?None  ?Yes ? ?PDL1 Expression 90% ? ?PRIOR THERAPY:A course of concurrent chemoradiation with weekly carboplatin for AUC of 2 and paclitaxel 45 Mg/M2.  Status post 6 cycles.  Last dose was given March 04, 2022 with partial response. ? ?CURRENT THERAPY: Consolidation treatment with immunotherapy with Imfinzi 1500 Mg IV every 4 weeks.  First dose Apr 17, 2022. ? ?INTERVAL HISTORY: ?Peter Diaz 71 y.o. male returns to the clinic today for follow-up visit accompanied by his son.  The patient is feeling fine today with no concerning complaints.  He is recovering well from the course of concurrent chemoradiation.  He has mild odynophagia.  He denied having any significant chest pain, shortness of breath but has mild cough with no hemoptysis.  He has no nausea, vomiting, diarrhea or constipation.  He has no headache or visual changes.  He had repeat CT scan of the chest performed recently and he is here for evaluation and discussion of his scan results and treatment options. ? ? ?MEDICAL HISTORY: ?Past Medical History:  ?Diagnosis Date  ? GERD (gastroesophageal reflux disease)   ? Gout   ? HTN (hypertension)   ? ? ?ALLERGIES:  has No Known Allergies. ? ?MEDICATIONS:  ?Current Outpatient Medications  ?Medication Sig Dispense Refill  ? albuterol (VENTOLIN HFA) 108 (90 Base) MCG/ACT inhaler Inhale 2 puffs into the lungs every 6 (six)  hours as needed for wheezing or shortness of breath. (Patient not taking: Reported on 01/21/2022) 8 g 6  ? amLODipine (NORVASC) 10 MG tablet Take 10 mg by mouth daily.    ? esomeprazole (NEXIUM) 20 MG capsule Take 20 mg by mouth daily at 12 noon.    ? HYDROcodone-acetaminophen (HYCET) 7.5-325 mg/15 ml solution Take 15 mLs by mouth 4 (four) times daily as needed for moderate pain. 120 mL 0  ? ibuprofen (ADVIL) 200 MG tablet Take 200 mg by mouth as needed.    ? prochlorperazine (COMPAZINE) 10 MG tablet Take 1 tablet (10 mg total) by mouth every 6 (six) hours as needed for nausea or vomiting. (Patient not taking: Reported on 01/21/2022) 30 tablet 0  ? sucralfate (CARAFATE) 1 g tablet Take 1 tablet (1 g total) by mouth 4 (four) times daily -  with meals and at bedtime. 120 tablet 1  ? tamsulosin (FLOMAX) 0.4 MG CAPS capsule Take 0.4 mg by mouth at bedtime.    ? ?No current facility-administered medications for this visit.  ? ? ?SURGICAL HISTORY:  ?Past Surgical History:  ?Procedure Laterality Date  ? BIOPSY  01/11/2022  ? Procedure: BIOPSY;  Surgeon: Garner Nash, DO;  Location: [facility];  Service: Pulmonary;;  ? BRAIN SURGERY    ? BRONCHIAL BRUSHINGS  01/11/2022  ? Procedure: BRONCHIAL BRUSHINGS;  Surgeon: Garner Nash, DO;  Location: [facility];  Service: Pulmonary;;  ? BRONCHIAL NEEDLE ASPIRATION BIOPSY  01/11/2022  ? Procedure: BRONCHIAL NEEDLE ASPIRATION BIOPSIES;  Surgeon: Garner Nash, DO;  Location: [facility];  Service:  Pulmonary;;  ? VIDEO BRONCHOSCOPY WITH ENDOBRONCHIAL ULTRASOUND Left 01/11/2022  ? Procedure: VIDEO BRONCHOSCOPY WITH ENDOBRONCHIAL ULTRASOUND;  Surgeon: Garner Nash, DO;  Location: [facility];  Service: Pulmonary;  Laterality: Left;  ? ? ?REVIEW OF SYSTEMS:  Constitutional: positive for fatigue ?Eyes: negative ?Ears, nose, mouth, throat, and face: negative ?Respiratory: negative ?Cardiovascular: negative ?Gastrointestinal: positive for  odynophagia ?Genitourinary:negative ?Integument/breast: negative ?Hematologic/lymphatic: negative ?Musculoskeletal:negative ?Neurological: negative ?Behavioral/Psych: negative ?Endocrine: negative ?Allergic/Immunologic: negative  ? ?PHYSICAL EXAMINATION: General appearance: alert, cooperative, fatigued, and no distress ?Head: Normocephalic, without obvious abnormality, atraumatic ?Neck: no adenopathy, no JVD, supple, symmetrical, trachea midline, and thyroid not enlarged, symmetric, no tenderness/mass/nodules ?Lymph nodes: Cervical, supraclavicular, and axillary nodes normal. ?Resp: clear to auscultation bilaterally ?Back: symmetric, no curvature. ROM normal. No CVA tenderness. ?Cardio: regular rate and rhythm, S1, S2 normal, no murmur, click, rub or gallop ?GI: soft, non-tender; bowel sounds normal; no masses,  no organomegaly ?Extremities: extremities normal, atraumatic, no cyanosis or edema ?Neurologic: Alert and oriented X 3, normal strength and tone. Normal symmetric reflexes. Normal coordination and gait ? ?ECOG PERFORMANCE STATUS: 1 - Symptomatic but completely ambulatory ? ?Blood pressure 128/89, pulse 97, temperature (!) 97 ?F (36.1 ?C), temperature source Tympanic, resp. rate 18, weight 155 lb 3 oz (70.4 kg), SpO2 100 %. ? ?LABORATORY DATA: ?Lab Results  ?Component Value Date  ? WBC 7.3 04/04/2022  ? HGB 14.1 04/04/2022  ? HCT 42.1 04/04/2022  ? MCV 91.9 04/04/2022  ? PLT 308 04/04/2022  ? ? ?  Chemistry   ?   ?Component Value Date/Time  ? NA 140 04/04/2022 1033  ? K 3.9 04/04/2022 1033  ? CL 107 04/04/2022 1033  ? CO2 30 04/04/2022 1033  ? BUN 13 04/04/2022 1033  ? CREATININE 1.14 04/04/2022 1033  ?    ?Component Value Date/Time  ? CALCIUM 9.3 04/04/2022 1033  ? ALKPHOS 103 04/04/2022 1033  ? AST 9 (L) 04/04/2022 1033  ? ALT 8 04/04/2022 1033  ? BILITOT 0.4 04/04/2022 1033  ?  ? ? ? ?RADIOGRAPHIC STUDIES: ?CT Chest W Contrast ? ?Result Date: 04/04/2022 ?CLINICAL DATA:  A 71 year old male presents for  evaluation of non-small cell lung cancer in follow-up. * Tracking Code: BO * EXAM: CT CHEST WITH CONTRAST TECHNIQUE: Multidetector CT imaging of the chest was performed during intravenous contrast administration. RADIATION DOSE REDUCTION: This exam was performed according to the departmental dose-optimization program which includes automated exposure control, adjustment of the mA and/or kV according to patient size and/or use of iterative reconstruction technique. CONTRAST:  58m OMNIPAQUE IOHEXOL 300 MG/ML  SOLN COMPARISON:  December 14, 2021. FINDINGS: Cardiovascular: Aorta is of normal caliber with scattered calcified and noncalcified plaque. Normal heart size without pericardial effusion. Three-vessel coronary artery disease. Central pulmonary vasculature is unremarkable on venous phase. Mediastinum/Nodes: LEFT hilar adenopathy diminished in size. No lymph nodes at the LEFT hilum beyond a cm as discrete lymph nodes. Just below LEFT pulmonary artery and about bronchial structures there was of nodal conglomerate and or soft tissue that measured as much as 3.8 x 2.0 cm on the prior study now with 11 mm thickness tracking along central bronchovascular structures. No signs of mediastinal or thoracic inlet adenopathy. No axillary lymphadenopathy. Esophagus grossly normal. No RIGHT hilar adenopathy. Subcarinal nodal tissue has diminished in size now measuring 5 mm as compared to 12 mm. Lungs/Pleura: Juxta hilar cavitary mass is diminished in size, situated along the major fissure in the LEFT chest (image 82/7) 4.9 x 3.0 cm previously  6.5 x 3.9 cm when measured in a similar fashion. Greatest wall thickness on today's study is 9 mm, and in the same location on the prior study this measured 17 mm. No new pulmonary nodules. No sign of pleural effusion. Airways are patent into the RIGHT chest, narrowed at the LEFT hilum but patent and with improved appearance of airway narrowing that was present previously. Pulmonary  emphysema as before. Upper Abdomen: No acute findings relative to visualized liver, gallbladder, pancreas, spleen, adrenal glands or kidneys. Subtle area of low attenuation in the neck of the pancreas partially visualized measuring as much as 1.8 x 1.1 cm

## 2022-04-08 NOTE — Progress Notes (Signed)
Oncology Nurse Navigator Documentation ? ? ?  04/08/2022  ?  3:00 PM 01/23/2022  ? 12:00 PM 01/18/2022  ? 10:00 AM 01/17/2022  ?  3:00 PM 01/16/2022  ?  4:00 PM 01/11/2022  ? 11:00 AM  ?Oncology Nurse Navigator Flowsheets  ?Abnormal Finding Date      11/16/2021  ?Confirmed Diagnosis Date      01/11/2022  ?Diagnosis Status      Pathology Pending  ?Planned Course of Treatment    Chemo/Radiation Concurrent    ?Phase of Treatment  Radiation  Chemo    ?Chemotherapy Actual Start Date:  01/28/2022      ?Radiation Actual Start Date:  01/28/2022      ?Navigator Follow Up Date: 05/13/2022 02/05/2022  01/21/2022  01/17/2022  ?Navigator Follow Up Reason: Change in Treatment Follow-up Appointment  Appointment Review  New Patient Appointment  ?Navigator Location [facility]  ?Referral Date to RadOnc/MedOnc      01/11/2022  ?Navigator Encounter Type Clinic/MDC Pathology Review Appt/Treatment Plan Review Clinic/MDC;Initial MedOnc Other: Telephone  ?Telephone      Outgoing Call  ?Treatment Initiated Date  01/28/2022      ?Patient Visit Type MedOnc Other Other Initial;MedOnc  Initial  ?Treatment Phase Treatment;Other/I spoke to Peter Diaz today. He completed his chemo and treatment now is IO therapy.  I helped to explain medication and added information to AVS.  Peter Diaz looks great today and no complaints. Will follow up on his schedule.  Pre-Tx/Tx Discussion Pre-Tx/Tx Discussion Pre-Tx/Tx Discussion Pre-Tx/Tx Discussion Abnormal Scans  ?Barriers/Navigation Needs Education Coordination of Care Coordination of Care Education Coordination of Care Education;Coordination of Care  ?Education Other   Newly Diagnosed Cancer Education;Understanding Cancer/ Treatment Options;Other  Other  ?Interventions Psycho-Social Support;Education Coordination of Care Coordination of Care Education;Psycho-Social Support Coordination of Care Coordination of Care;Education;Psycho-Social  Support  ?Acuity Level 2-Minimal Needs (1-2 Barriers Identified) Level 2-Minimal Needs (1-2 Barriers Identified) Level 2-Minimal Needs (1-2 Barriers Identified) Level 3-Moderate Needs (3-4 Barriers Identified) Level 2-Minimal Needs (1-2 Barriers Identified) Level 2-Minimal Needs (1-2 Barriers Identified)  ?Coordination of Care  Pathology Other  Pathology Appts  ?Education Method Verbal;Other   Verbal;Other  Verbal  ?Time Spent with Patient 30 30 30 30 15  45  ?  ?

## 2022-04-08 NOTE — Progress Notes (Signed)
DISCONTINUE ON PATHWAY REGIMEN - Non-Small Cell Lung ? ? ?  Administer weekly: ?    Paclitaxel  ?    Carboplatin  ? ?**Always confirm dose/schedule in your pharmacy ordering system** ? ?REASON: Continuation Of Treatment ?PRIOR TREATMENT: [id number]: Carboplatin AUC=2 + Paclitaxel 45 mg/m2 Weekly During Radiation ?TREATMENT RESPONSE: Partial Response (PR) ? ?START ON PATHWAY REGIMEN - Non-Small Cell Lung ? ? ?  A cycle is every 28 days: ?    Durvalumab  ? ?**Always confirm dose/schedule in your pharmacy ordering system** ? ?Patient Characteristics: ?Preoperative or Nonsurgical Candidate (Clinical Staging), Stage III - Nonsurgical Candidate (Nonsquamous and Squamous), PS = 0, 1 ?Therapeutic Status: Preoperative or Nonsurgical Candidate (Clinical Staging) ?AJCC T Category: cT4 ?AJCC N Category: cN2 ?AJCC M Category: cM0 ?AJCC 8 Stage Grouping: IIIB ?ECOG Performance Status: 1 ?Intent of Therapy: ?Curative Intent, Discussed with Patient ?

## 2022-04-08 NOTE — Patient Instructions (Signed)
Durvalumab injection ?What is this medication? ?DURVALUMAB (dur VAL ue mab) is a monoclonal antibody. It is used to treat lung cancer. ?This medicine may be used for other purposes; ask your health care provider or pharmacist if you have questions. ?COMMON BRAND NAME(S): IMFINZI ?What should I tell my care team before I take this medication? ?They need to know if you have any of these conditions: ?autoimmune diseases like Crohn's disease, ulcerative colitis, or lupus ?have had or planning to have an allogeneic stem cell transplant (uses someone else's stem cells) ?history of organ transplant ?history of radiation to the chest ?nervous system problems like myasthenia gravis or Guillain-Barre syndrome ?an unusual or allergic reaction to durvalumab, other medicines, foods, dyes, or preservatives ?pregnant or trying to get pregnant ?breast-feeding ?How should I use this medication? ?This medicine is for infusion into a vein. It is given by a health care professional in a hospital or clinic setting. ?A special MedGuide will be given to you before each treatment. Be sure to read this information carefully each time. ?Talk to your pediatrician regarding the use of this medicine in children. Special care may be needed. ?Overdosage: If you think you have taken too much of this medicine contact a poison control center or emergency room at once. ?NOTE: This medicine is only for you. Do not share this medicine with others. ?What if I miss a dose? ?It is important not to miss your dose. Call your doctor or health care professional if you are unable to keep an appointment. ?What may interact with this medication? ?Interactions have not been studied. ?This list may not describe all possible interactions. Give your health care provider a list of all the medicines, herbs, non-prescription drugs, or dietary supplements you use. Also tell them if you smoke, drink alcohol, or use illegal drugs. Some items may interact with your  medicine. ?What should I watch for while using this medication? ?This medication may make you feel generally unwell. Continue your course of treatment even though you feel ill unless your care team tells you to stop. ?You may need blood work done while you are taking this medication. ?Do not become pregnant while taking this medication or for 3 months after stopping it. Women should inform their care team if they wish to become pregnant or think they might be pregnant. There is a potential for serious side effects to an unborn child. Talk to your care team or pharmacist for more information. Do not breast-feed an infant while taking this medication or for 3 months after stopping it. ?What side effects may I notice from receiving this medication? ?Side effects that you should report to your care team as soon as possible: ?Allergic reactions--skin rash, itching, hives, swelling of the face, lips, tongue, or throat ?Bloody or watery diarrhea ?Dizziness, loss of balance or coordination, confusion or trouble speaking ?Dry cough, shortness of breath or trouble breathing ?Flushing, mostly over the face, neck, and chest, during injection ?High blood sugar (hyperglycemia)--increased thirst or amount of urine, unusual weakness or fatigue, blurry vision ?High thyroid levels (hyperthyroidism)--fast or irregular heartbeat, weight loss, excessive sweating or sensitivity to heat, tremors or shaking, anxiety, nervousness, irregular menstrual cycle or spotting ?Infection--fever, chills, cough, or sore throat ?Liver injury--right upper belly pain, loss of appetite, nausea, light-colored stool, dark yellow or brown urine, yellowing skin or eyes, unusual weakness or fatigue ?Low adrenal gland function--nausea, vomiting, loss of appetite, unusual weakness or fatigue, dizziness, low blood pressure ?Low thyroid levels (hypothyroidism)--unusual weakness or fatigue,  increased sensitivity to cold, constipation, hair loss, dry skin, weight  gain, feelings of depression ?Pancreatitis--severe stomach pain that spreads to your back or gets worse after eating or when touched, fever, nausea, vomiting ?Rash, fever, and swollen lymph nodes ?Redness, blistering, peeling or loosening of the skin, including inside the mouth ?Wheezing--trouble breathing with loud or whistling sounds ?Side effects that usually do not require medical attention (report these to your care team if they continue or are bothersome): ?Fatigue ?Hair loss ?This list may not describe all possible side effects. Call your doctor for medical advice about side effects. You may report side effects to FDA at 1-800-FDA-1088. ?Where should I keep my medication? ?This medication is given in a hospital or clinic. It will not be stored at home. ?NOTE: This sheet is a summary. It may not cover all possible information. If you have questions about this medicine, talk to your doctor, pharmacist, or health care provider. ?? 2023 Elsevier/Gold Standard (2021-10-26 00:00:00) ? ?

## 2022-04-10 ENCOUNTER — Encounter: Payer: Self-pay | Admitting: Radiation Oncology

## 2022-04-11 ENCOUNTER — Telehealth: Payer: Self-pay | Admitting: Internal Medicine

## 2022-04-11 NOTE — Telephone Encounter (Signed)
Scheduled per 05/01 los, patient has been called and notified. ?

## 2022-04-14 NOTE — Progress Notes (Incomplete)
?  Radiation Oncology         [phone] ?________________________________ ? ?Patient Name: Peter Diaz ?MRN: [id number] ?DOB: [birth year] ?Referring Physician: June Leap ?Date of Service: 03/08/2022 ?[facility] [place] ? ?                                                      End Of Treatment Note ? ?Diagnoses: C34.12-Malignant neoplasm of upper lobe, left bronchus or lung ? ?Cancer Staging: The encounter diagnosis was Squamous cell carcinoma of bronchus in left lower lobe ([place]). ?  ?Stage IIIB (cT4, cN2, cM0) Invasive moderately differentiated squamous cell carcinoma of the LUL ? ?Intent: Curative ? ?Radiation Treatment Dates: 01/28/2022 through 03/08/2022 ?Site Technique Total Dose (Gy) Dose per Fx (Gy) Completed Fx Beam Energies  ?Lung, Left: Lung_L 3D 60/60 2 30/30 10X  ? ?Narrative: The patient tolerated radiation therapy relatively well. During his final weekly treatment check on 03/05/22, the patient reported throat pain rated at a 9/10, SOB on exertion, dry cough, and decreased appetite due to swallowing issues.  ? ?The patient was started Carafate for his swallowing issues.  He was also given hydrocodone elixir by Dr. Julien Nordmann but has been having difficulty locating this medication.  If we are unable to locate this medication I will give him a Roxicet if this can be found at a local pharmacy. ? ? ?Plan: The patient will follow-up with radiation oncology in one month . ? ?________________________________________________ ?----------------------------------- ? ?Blair Promise, PhD, MD ? ?This document serves as a record of services personally performed by Gery Pray, MD. It was created on his behalf by Roney Mans, a trained medical scribe. The creation of this record is based on the scribe's personal observations and the provider's statements to them. This document has been checked and approved by the attending provider. ? ?

## 2022-04-14 NOTE — Progress Notes (Signed)
?Radiation Oncology         [phone] ?________________________________ ? ?Name: Peter Diaz MRN: [id number]  ?Date: 04/15/2022  DOB: 1951-10-08 ? ?Follow-Up Visit Note ? ?CC: Shirline Frees, MD  Garner Nash, DO ? ?  ICD-10-CM   ?1. Squamous cell carcinoma of bronchus in left lower lobe (HCC)  C34.32   ?  ?2. Lung mass  R91.8   ?  ? ? ?Diagnosis: The encounter diagnosis was Squamous cell carcinoma of bronchus in left lower lobe ([facility]). ?  ?Stage IIIB (cT4, cN2, cM0) Invasive moderately differentiated squamous cell carcinoma of the LUL  ? ?Interval Since Last Radiation: 1 month and 9 days  ? ?Intent: Curative ? ?Radiation Treatment Dates: 01/28/2022 through 03/08/2022 ?Site Technique Total Dose (Gy) Dose per Fx (Gy) Completed Fx Beam Energies  ?Lung, Left: Lung_L 3D 60/60 2 30/30 10X  ? ? ?Narrative:  The patient returns today for routine 1 month follow-up. The patient tolerated radiation therapy relatively well. During his final weekly treatment check on 03/05/22, the patient reported throat pain rated at a 9/10, SOB on exertion, dry cough, and decreased appetite due to swallowing issues. He was started Carafate for his swallowing issues and given hydrocodone elixir by Dr. Julien Nordmann but reported difficulty locating this medication.      ? ?Since his initial consultation on 01/21/22, the patient completed his 5th and final cycle of concurrent chemotherapy consisting of carboplatin and paclitaxel on 03/04/22 under the care of Dr. Julien Nordmann. During a follow up visit with Dr. Julien Nordmann on that same date, the patient reported tolerating treatment well overall except for fatigue, weight loss, and odynophagia from radiotherapy induced esophagitis.          ? ?During his most recent follow up visit with Dr. Julien Nordmann on 04/08/22, the patient was noted to be recovering well from concurrent treatment other than going odynophagia. Further treatment options were reviewed with the patient, and following discussion of the risks  and benefits, the patient agreed to proceed with immunotherapy consisting of imfinzi starting on 04/17/22.    ? ?The patient also had a MRI of the brain performed on 01/22/22 which showed no evidence of intracranial metastatic disease.  MRI did note a sizable focus of chronic encephalomalacia/gliosis within the anterior left frontal lobe, and small chronic cortically-based infarcts within the posteromedial right frontal lobe and right parietal lobe.  ? ?His most recent chest CT on 04/05/23 demonstrated an interval response to therapy; defined by resolution of mediastinal adenopathy, as well as a decrease in size of large cavitary mass contiguous with the left hilum involving the major fissure in the left upper lobe.  CT also showed a subtle area of low attenuation in the neck of the pancreas favored to represent a small cystic pancreatic neoplasm, such as IPMN.  ? ?He reports his swallowing difficulties have completely cleared up this time.  He can swallow any liquids or solids he wishes without difficulty.  He denies any changes in his breathing.  His energy level is good at this time.  He denies any significant cough or hemoptysis or pain within the chest area. ? ?Allergies:  has No Known Allergies. ? ?Meds: ?Current Outpatient Medications  ?Medication Sig Dispense Refill  ? albuterol (VENTOLIN HFA) 108 (90 Base) MCG/ACT inhaler Inhale 2 puffs into the lungs every 6 (six) hours as needed for wheezing or shortness of breath. 8 g 6  ? amLODipine (NORVASC) 10 MG tablet Take 10 mg by mouth daily.    ?  esomeprazole (NEXIUM) 20 MG capsule Take 20 mg by mouth daily at 12 noon.    ? ibuprofen (ADVIL) 200 MG tablet Take 200 mg by mouth as needed.    ? tamsulosin (FLOMAX) 0.4 MG CAPS capsule Take 0.4 mg by mouth at bedtime.    ? HYDROcodone-acetaminophen (HYCET) 7.5-325 mg/15 ml solution Take 15 mLs by mouth 4 (four) times daily as needed for moderate pain. (Patient not taking: Reported on 04/15/2022) 120 mL 0  ?  prochlorperazine (COMPAZINE) 10 MG tablet Take 1 tablet (10 mg total) by mouth every 6 (six) hours as needed for nausea or vomiting. (Patient not taking: Reported on 01/21/2022) 30 tablet 0  ? sucralfate (CARAFATE) 1 g tablet Take 1 tablet (1 g total) by mouth 4 (four) times daily -  with meals and at bedtime. (Patient not taking: Reported on 04/15/2022) 120 tablet 1  ? ?No current facility-administered medications for this encounter.  ? ? ?Physical Findings: ?The patient is in no acute distress. Patient is alert and oriented. ? height is 5\' 8"  (1.727 m) and weight is 154 lb 6 oz (70 kg). His temporal temperature is 96.2 ?F (35.7 ?C) (abnormal). His blood pressure is 123/67 and his pulse is 102 (abnormal). His respiration is 18 and oxygen saturation is 98%. .   Lungs are clear to auscultation bilaterally. Heart has regular rate and rhythm. No palpable cervical, supraclavicular, or axillary adenopathy. Abdomen soft, non-tender, normal bowel sounds.  ? ? ?Lab Findings: ?Lab Results  ?Component Value Date  ? WBC 7.3 04/04/2022  ? HGB 14.1 04/04/2022  ? HCT 42.1 04/04/2022  ? MCV 91.9 04/04/2022  ? PLT 308 04/04/2022  ? ? ?Radiographic Findings: ?CT Chest W Contrast ? ?Result Date: 04/04/2022 ?CLINICAL DATA:  A 71 year old male presents for evaluation of non-small cell lung cancer in follow-up. * Tracking Code: BO * EXAM: CT CHEST WITH CONTRAST TECHNIQUE: Multidetector CT imaging of the chest was performed during intravenous contrast administration. RADIATION DOSE REDUCTION: This exam was performed according to the departmental dose-optimization program which includes automated exposure control, adjustment of the mA and/or kV according to patient size and/or use of iterative reconstruction technique. CONTRAST:  71mL OMNIPAQUE IOHEXOL 300 MG/ML  SOLN COMPARISON:  December 14, 2021. FINDINGS: Cardiovascular: Aorta is of normal caliber with scattered calcified and noncalcified plaque. Normal heart size without pericardial  effusion. Three-vessel coronary artery disease. Central pulmonary vasculature is unremarkable on venous phase. Mediastinum/Nodes: LEFT hilar adenopathy diminished in size. No lymph nodes at the LEFT hilum beyond a cm as discrete lymph nodes. Just below LEFT pulmonary artery and about bronchial structures there was of nodal conglomerate and or soft tissue that measured as much as 3.8 x 2.0 cm on the prior study now with 11 mm thickness tracking along central bronchovascular structures. No signs of mediastinal or thoracic inlet adenopathy. No axillary lymphadenopathy. Esophagus grossly normal. No RIGHT hilar adenopathy. Subcarinal nodal tissue has diminished in size now measuring 5 mm as compared to 12 mm. Lungs/Pleura: Juxta hilar cavitary mass is diminished in size, situated along the major fissure in the LEFT chest (image 82/7) 4.9 x 3.0 cm previously 6.5 x 3.9 cm when measured in a similar fashion. Greatest wall thickness on today's study is 9 mm, and in the same location on the prior study this measured 17 mm. No new pulmonary nodules. No sign of pleural effusion. Airways are patent into the RIGHT chest, narrowed at the LEFT hilum but patent and with improved appearance of airway narrowing  that was present previously. Pulmonary emphysema as before. Upper Abdomen: No acute findings relative to visualized liver, gallbladder, pancreas, spleen, adrenal glands or kidneys. Subtle area of low attenuation in the neck of the pancreas partially visualized measuring as much as 1.8 x 1.1 cm and not associated with ductal dilation may represent a cystic pancreatic neoplasm and is not associated with biliary duct distension or pancreatic duct distension, unchanged compared to previous imaging with respect to imaged portions. Musculoskeletal: No acute bone finding. No destructive bone process. Spinal degenerative changes. IMPRESSION: 1. Interval response to therapy with resolution of mediastinal adenopathy and decreased size of  large cavitary mass that is contiguous with the LEFT hilum and involves the major fissure in the LEFT upper lobe. 2. Subtle area of low attenuation in the neck of the pancreas favored to represent a small cystic pancr

## 2022-04-15 ENCOUNTER — Ambulatory Visit
Admission: RE | Admit: 2022-04-15 | Discharge: 2022-04-15 | Disposition: A | Discharge status: Home / Self Care | Payer: PPO | Source: Ambulatory Visit | Attending: Radiation Oncology | Admitting: Radiation Oncology

## 2022-04-15 ENCOUNTER — Other Ambulatory Visit: Payer: Self-pay

## 2022-04-15 ENCOUNTER — Encounter: Payer: Self-pay | Admitting: Radiation Oncology

## 2022-04-15 VITALS — BP 123/67 | HR 102 | Temp 96.2°F | Resp 18 | Ht 68.0 in | Wt 154.4 lb

## 2022-04-15 DIAGNOSIS — Z79899 Other long term (current) drug therapy: Secondary | ICD-10-CM | POA: Insufficient documentation

## 2022-04-15 DIAGNOSIS — J439 Emphysema, unspecified: Secondary | ICD-10-CM | POA: Diagnosis not present

## 2022-04-15 DIAGNOSIS — Z923 Personal history of irradiation: Secondary | ICD-10-CM | POA: Diagnosis not present

## 2022-04-15 DIAGNOSIS — R918 Other nonspecific abnormal finding of lung field: Secondary | ICD-10-CM

## 2022-04-15 DIAGNOSIS — I2510 Atherosclerotic heart disease of native coronary artery without angina pectoris: Secondary | ICD-10-CM | POA: Insufficient documentation

## 2022-04-15 DIAGNOSIS — C3432 Malignant neoplasm of lower lobe, left bronchus or lung: Secondary | ICD-10-CM | POA: Insufficient documentation

## 2022-04-15 HISTORY — DX: Personal history of irradiation: Z92.3

## 2022-04-15 NOTE — Progress Notes (Signed)
Peter Diaz is here today for follow up post radiation to the lung. ? ?Lung Side: Left,patient completed treatment on 03/08/22. ? ?Does the patient complain of any of the following: ?Pain: No ?Shortness of breath w/wo exertion: yes, on exertion.  ?Cough: yes, dry cough. ?Hemoptysis: No ?Pain with swallowing: No ?Swallowing/choking concerns: No ?Appetite: good ?Weight- ?Wt Readings from Last 3 Encounters:  ?04/15/22 154 lb 6 oz (70 kg)  ?04/08/22 155 lb 3 oz (70.4 kg)  ?03/04/22 153 lb 4 oz (69.5 kg)  ?  ?Energy Level: good  ?Post radiation skin Changes: no ? ? ? ?Additional comments if applicable:  ? ?Vitals:  ? 04/15/22 1144  ?BP: 123/67  ?Pulse: (!) 102  ?Resp: 18  ?Temp: (!) 96.2 ?F (35.7 ?C)  ?TempSrc: Temporal  ?SpO2: 98%  ?Weight: 154 lb 6 oz (70 kg)  ?Height: 5\' 8"  (1.727 m)  ?  ?

## 2022-04-16 ENCOUNTER — Encounter: Payer: Self-pay | Admitting: *Deleted

## 2022-04-16 ENCOUNTER — Other Ambulatory Visit: Payer: PPO

## 2022-04-16 NOTE — Progress Notes (Signed)
Oncology Nurse Navigator Documentation ? ? ?  04/16/2022  ?  1:00 PM 04/08/2022  ?  3:00 PM 01/23/2022  ? 12:00 PM 01/18/2022  ? 10:00 AM 01/17/2022  ?  3:00 PM 01/16/2022  ?  4:00 PM 01/11/2022  ? 11:00 AM  ?Oncology Nurse Navigator Flowsheets  ?Abnormal Finding Date       11/16/2021  ?Confirmed Diagnosis Date       01/11/2022  ?Diagnosis Status       Pathology Pending  ?Planned Course of Treatment     Chemo/Radiation Concurrent    ?Phase of Treatment   Radiation  Chemo    ?Chemotherapy Actual Start Date:   01/28/2022      ?Radiation Actual Start Date:   01/28/2022      ?Navigator Follow Up Date:  05/13/2022 02/05/2022  01/21/2022  01/17/2022  ?Navigator Follow Up Reason:  Change in Treatment Follow-up Appointment  Appointment Review  New Patient Appointment  ?Navigator Location [facility]  ?Referral Date to RadOnc/MedOnc       01/11/2022  ?Navigator Encounter Type Appt/Treatment Plan Review Clinic/MDC Pathology Review Appt/Treatment Plan Review Clinic/MDC;Initial MedOnc Other: Telephone  ?Telephone       Outgoing Call  ?Treatment Initiated Date   01/28/2022      ?Patient Visit Type Other MedOnc Other Other Initial;MedOnc  Initial  ?Treatment Phase Treatment;Other Treatment;Other Pre-Tx/Tx Discussion Pre-Tx/Tx Discussion Pre-Tx/Tx Discussion Pre-Tx/Tx Discussion Abnormal Scans  ?Barriers/Navigation Needs Coordination of Care/I followed up on Mr. Bernhard schedule. He is set up for his IO therapy at this time. According to records, no PA needed.  Education Coordination of Care Coordination of Care Education Coordination of Care Education;Coordination of Care  ?Education  Other   Newly Diagnosed Cancer Education;Understanding Cancer/ Treatment Options;Other  Other  ?Interventions Coordination of Care Psycho-Social Support;Education Coordination of Care Coordination of Care Education;Psycho-Social Support Coordination of Care Coordination  of Care;Education;Psycho-Social Support  ?Acuity Level 2-Minimal Needs (1-2 Barriers Identified) Level 2-Minimal Needs (1-2 Barriers Identified) Level 2-Minimal Needs (1-2 Barriers Identified) Level 2-Minimal Needs (1-2 Barriers Identified) Level 3-Moderate Needs (3-4 Barriers Identified) Level 2-Minimal Needs (1-2 Barriers Identified) Level 2-Minimal Needs (1-2 Barriers Identified)  ?Coordination of Care Other  Pathology Other  Pathology Appts  ?Education Method  Verbal;Other   Verbal;Other  Verbal  ?Time Spent with Patient 15 30 30 30 30 15  45  ?  ?

## 2022-04-17 ENCOUNTER — Other Ambulatory Visit: Payer: Self-pay

## 2022-04-17 ENCOUNTER — Inpatient Hospital Stay: Payer: PPO

## 2022-04-17 VITALS — BP 124/70 | HR 86 | Temp 97.8°F | Resp 17 | Wt 155.2 lb

## 2022-04-17 DIAGNOSIS — Z5112 Encounter for antineoplastic immunotherapy: Secondary | ICD-10-CM | POA: Diagnosis not present

## 2022-04-17 DIAGNOSIS — C3432 Malignant neoplasm of lower lobe, left bronchus or lung: Secondary | ICD-10-CM

## 2022-04-17 LAB — CMP (CANCER CENTER ONLY)
ALT: 6 U/L (ref 0–44)
AST: 10 U/L — ABNORMAL LOW (ref 15–41)
Albumin: 3.8 g/dL (ref 3.5–5.0)
Alkaline Phosphatase: 94 U/L (ref 38–126)
Anion gap: 5 (ref 5–15)
BUN: 10 mg/dL (ref 8–23)
CO2: 28 mmol/L (ref 22–32)
Calcium: 9 mg/dL (ref 8.9–10.3)
Chloride: 107 mmol/L (ref 98–111)
Creatinine: 1.08 mg/dL (ref 0.61–1.24)
GFR, Estimated: >60 mL/min (ref >60)
Glucose, Bld: 113 mg/dL — ABNORMAL HIGH (ref 70–99)
Potassium: 3.7 mmol/L (ref 3.5–5.1)
Sodium: 140 mmol/L (ref 135–145)
Total Bilirubin: 0.4 mg/dL (ref 0.3–1.2)
Total Protein: 6.8 g/dL (ref 6.5–8.1)

## 2022-04-17 LAB — CBC WITH DIFFERENTIAL (CANCER CENTER ONLY)
Abs Immature Granulocytes: 0.05 10*3/uL (ref 0.00–0.07)
Basophils Absolute: 0.1 10*3/uL (ref 0.0–0.1)
Basophils Relative: 1 %
Eosinophils Absolute: 0.2 10*3/uL (ref 0.0–0.5)
Eosinophils Relative: 2 %
HCT: 43.2 % (ref 39.0–52.0)
Hemoglobin: 14.1 g/dL (ref 13.0–17.0)
Immature Granulocytes: 1 %
Lymphocytes Relative: 14 %
Lymphs Abs: 0.9 10*3/uL (ref 0.7–4.0)
MCH: 30.5 pg (ref 26.0–34.0)
MCHC: 32.6 g/dL (ref 30.0–36.0)
MCV: 93.5 fL (ref 80.0–100.0)
Monocytes Absolute: 0.7 10*3/uL (ref 0.1–1.0)
Monocytes Relative: 11 %
Neutro Abs: 4.9 10*3/uL (ref 1.7–7.7)
Neutrophils Relative %: 71 %
Platelet Count: 284 10*3/uL (ref 150–400)
RBC: 4.62 MIL/uL (ref 4.22–5.81)
RDW: 18 % — ABNORMAL HIGH (ref 11.5–15.5)
WBC Count: 6.8 10*3/uL (ref 4.0–10.5)
nRBC: 0 % (ref 0.0–0.2)

## 2022-04-17 MED ORDER — SODIUM CHLORIDE 0.9 % IV SOLN
1500.0000 mg | Freq: Once | INTRAVENOUS | Status: AC
  Administered 2022-04-17: 1500 mg via INTRAVENOUS
  Filled 2022-04-17: qty 30

## 2022-04-17 MED ORDER — SODIUM CHLORIDE 0.9 % IV SOLN: Freq: Once | INTRAVENOUS | Status: AC

## 2022-04-17 NOTE — Patient Instructions (Addendum)
[facility]  Discharge Instructions: ?Thank you for choosing [facility] to provide your oncology and hematology care.  ? ?If you have a lab appointment with the [facility], please go directly to the [facility] and check in at the registration area. ?  ?Wear comfortable clothing and clothing appropriate for easy access to any Portacath or PICC line.  ? ?We strive to give you quality time with your provider. You may need to reschedule your appointment if you arrive late (15 or more minutes).  Arriving late affects you and other patients whose appointments are after yours.  Also, if you miss three or more appointments without notifying the office, you may be dismissed from the clinic at the provider?s discretion.    ?  ?For prescription refill requests, have your pharmacy contact our office and allow 72 hours for refills to be completed.   ? ?Today you received the following chemotherapy and/or immunotherapy agents: Imfinzi (durvalumab)    ?  ?To help prevent nausea and vomiting after your treatment, we encourage you to take your nausea medication as directed. ? ?BELOW ARE SYMPTOMS THAT SHOULD BE REPORTED IMMEDIATELY: ?*FEVER GREATER THAN 100.4 F (38 ?C) OR HIGHER ?*CHILLS OR SWEATING ?*NAUSEA AND VOMITING THAT IS NOT CONTROLLED WITH YOUR NAUSEA MEDICATION ?*UNUSUAL SHORTNESS OF BREATH ?*UNUSUAL BRUISING OR BLEEDING ?*URINARY PROBLEMS (pain or burning when urinating, or frequent urination) ?*BOWEL PROBLEMS (unusual diarrhea, constipation, pain near the anus) ?TENDERNESS IN MOUTH AND THROAT WITH OR WITHOUT PRESENCE OF ULCERS (sore throat, sores in mouth, or a toothache) ?UNUSUAL RASH, SWELLING OR PAIN  ?UNUSUAL VAGINAL DISCHARGE OR ITCHING  ? ?Items with * indicate a potential emergency and should be followed up as soon as possible or go to the Emergency Department if any problems should occur. ? ?Please show the CHEMOTHERAPY ALERT CARD or IMMUNOTHERAPY ALERT CARD at  check-in to the Emergency Department and triage nurse. ? ?Should you have questions after your visit or need to cancel or reschedule your appointment, please contact [facility]  Dept: [phone]  and follow the prompts.  Office hours are 8:00 a.m. to 4:30 p.m. Monday - Friday. Please note that voicemails left after 4:00 p.m. may not be returned until the following business day.  We are closed weekends and major holidays. You have access to a nurse at all times for urgent questions. Please call the main number to the clinic Dept: [phone] and follow the prompts. ? ? ?For any non-urgent questions, you may also contact your provider using MyChart. We now offer e-Visits for anyone 36 and older to request care online for non-urgent symptoms. For details visit [email or web address]. ?  ?Also download the MyChart app! Go to the app store, search "MyChart", open the app, select [facility], and log in with your MyChart username and password. ? ?Due to Covid, a mask is required upon entering the hospital/clinic. If you do not have a mask, one will be given to you upon arrival. For doctor visits, patients may have 1 support person aged 29 or older with them. For treatment visits, patients cannot have anyone with them due to current Covid guidelines and our immunocompromised population.  ? ?Durvalumab injection ?What is this medication? ?DURVALUMAB (dur VAL ue mab) is a monoclonal antibody. It is used to treat lung cancer. ?This medicine may be used for other purposes; ask your health care provider or pharmacist if you have questions. ?COMMON BRAND NAME(S): IMFINZI ?What should I  tell my care team before I take this medication? ?They need to know if you have any of these conditions: ?autoimmune diseases like Crohn's disease, ulcerative colitis, or lupus ?have had or planning to have an allogeneic stem cell transplant (uses someone else's stem cells) ?history of organ transplant ?history  of radiation to the chest ?nervous system problems like myasthenia gravis or Guillain-Barre syndrome ?an unusual or allergic reaction to durvalumab, other medicines, foods, dyes, or preservatives ?pregnant or trying to get pregnant ?breast-feeding ?How should I use this medication? ?This medicine is for infusion into a vein. It is given by a health care professional in a hospital or clinic setting. ?A special MedGuide will be given to you before each treatment. Be sure to read this information carefully each time. ?Talk to your pediatrician regarding the use of this medicine in children. Special care may be needed. ?Overdosage: If you think you have taken too much of this medicine contact a poison control center or emergency room at once. ?NOTE: This medicine is only for you. Do not share this medicine with others. ?What if I miss a dose? ?It is important not to miss your dose. Call your doctor or health care professional if you are unable to keep an appointment. ?What may interact with this medication? ?Interactions have not been studied. ?This list may not describe all possible interactions. Give your health care provider a list of all the medicines, herbs, non-prescription drugs, or dietary supplements you use. Also tell them if you smoke, drink alcohol, or use illegal drugs. Some items may interact with your medicine. ?What should I watch for while using this medication? ?This medication may make you feel generally unwell. Continue your course of treatment even though you feel ill unless your care team tells you to stop. ?You may need blood work done while you are taking this medication. ?Do not become pregnant while taking this medication or for 3 months after stopping it. Women should inform their care team if they wish to become pregnant or think they might be pregnant. There is a potential for serious side effects to an unborn child. Talk to your care team or pharmacist for more information. Do not  breast-feed an infant while taking this medication or for 3 months after stopping it. ?What side effects may I notice from receiving this medication? ?Side effects that you should report to your care team as soon as possible: ?Allergic reactions--skin rash, itching, hives, swelling of the face, lips, tongue, or throat ?Bloody or watery diarrhea ?Dizziness, loss of balance or coordination, confusion or trouble speaking ?Dry cough, shortness of breath or trouble breathing ?Flushing, mostly over the face, neck, and chest, during injection ?High blood sugar (hyperglycemia)--increased thirst or amount of urine, unusual weakness or fatigue, blurry vision ?High thyroid levels (hyperthyroidism)--fast or irregular heartbeat, weight loss, excessive sweating or sensitivity to heat, tremors or shaking, anxiety, nervousness, irregular menstrual cycle or spotting ?Infection--fever, chills, cough, or sore throat ?Liver injury--right upper belly pain, loss of appetite, nausea, light-colored stool, dark yellow or brown urine, yellowing skin or eyes, unusual weakness or fatigue ?Low adrenal gland function--nausea, vomiting, loss of appetite, unusual weakness or fatigue, dizziness, low blood pressure ?Low thyroid levels (hypothyroidism)--unusual weakness or fatigue, increased sensitivity to cold, constipation, hair loss, dry skin, weight gain, feelings of depression ?Pancreatitis--severe stomach pain that spreads to your back or gets worse after eating or when touched, fever, nausea, vomiting ?Rash, fever, and swollen lymph nodes ?Redness, blistering, peeling or loosening of the skin,  including inside the mouth ?Wheezing--trouble breathing with loud or whistling sounds ?Side effects that usually do not require medical attention (report these to your care team if they continue or are bothersome): ?Fatigue ?Hair loss ?This list may not describe all possible side effects. Call your doctor for medical advice about side effects. You may  report side effects to FDA at 1-800-FDA-1088. ?Where should I keep my medication? ?This medication is given in a hospital or clinic. It will not be stored at home. ?NOTE: This sheet is a summary. It may not cover

## 2022-04-18 ENCOUNTER — Telehealth: Payer: Self-pay

## 2022-04-18 NOTE — Telephone Encounter (Signed)
Peter Diaz states that he is doing fine. He is eating, drinking, and urinating well. He knows to call the office at [phone] if he has any questions or concerns. ?

## 2022-04-18 NOTE — Telephone Encounter (Signed)
-----   Message from Wylene Men, RN sent at 04/17/2022  6:38 PM EDT ----- ?Regarding: MOHAMED 1ST TIME IMFINZI ?Patient received 1st time Imfinzi.  Tolerated well.  No s/s or c/o distress or discomfort.  Ambulatory at discharge. ? ?

## 2022-05-15 ENCOUNTER — Inpatient Hospital Stay: Payer: PPO | Attending: Internal Medicine | Admitting: Internal Medicine

## 2022-05-15 ENCOUNTER — Other Ambulatory Visit: Payer: Self-pay

## 2022-05-15 ENCOUNTER — Inpatient Hospital Stay: Payer: PPO

## 2022-05-15 ENCOUNTER — Encounter: Payer: Self-pay | Admitting: *Deleted

## 2022-05-15 VITALS — BP 110/68 | HR 52 | Temp 97.6°F | Resp 17 | Wt 153.4 lb

## 2022-05-15 DIAGNOSIS — K2100 Gastro-esophageal reflux disease with esophagitis, without bleeding: Secondary | ICD-10-CM | POA: Insufficient documentation

## 2022-05-15 DIAGNOSIS — Z5112 Encounter for antineoplastic immunotherapy: Secondary | ICD-10-CM | POA: Insufficient documentation

## 2022-05-15 DIAGNOSIS — Z923 Personal history of irradiation: Secondary | ICD-10-CM | POA: Diagnosis not present

## 2022-05-15 DIAGNOSIS — C3432 Malignant neoplasm of lower lobe, left bronchus or lung: Secondary | ICD-10-CM | POA: Diagnosis not present

## 2022-05-15 DIAGNOSIS — R1310 Dysphagia, unspecified: Secondary | ICD-10-CM | POA: Insufficient documentation

## 2022-05-15 DIAGNOSIS — Z79899 Other long term (current) drug therapy: Secondary | ICD-10-CM | POA: Diagnosis not present

## 2022-05-15 DIAGNOSIS — C3412 Malignant neoplasm of upper lobe, left bronchus or lung: Secondary | ICD-10-CM | POA: Insufficient documentation

## 2022-05-15 LAB — CBC WITH DIFFERENTIAL (CANCER CENTER ONLY)
Abs Immature Granulocytes: 0.06 10*3/uL (ref 0.00–0.07)
Basophils Absolute: 0.1 10*3/uL (ref 0.0–0.1)
Basophils Relative: 1 %
Eosinophils Absolute: 0.1 10*3/uL (ref 0.0–0.5)
Eosinophils Relative: 1 %
HCT: 36.3 % — ABNORMAL LOW (ref 39.0–52.0)
Hemoglobin: 12 g/dL — ABNORMAL LOW (ref 13.0–17.0)
Immature Granulocytes: 1 %
Lymphocytes Relative: 5 %
Lymphs Abs: 0.5 10*3/uL — ABNORMAL LOW (ref 0.7–4.0)
MCH: 31.3 pg (ref 26.0–34.0)
MCHC: 33.1 g/dL (ref 30.0–36.0)
MCV: 94.8 fL (ref 80.0–100.0)
Monocytes Absolute: 0.8 10*3/uL (ref 0.1–1.0)
Monocytes Relative: 8 %
Neutro Abs: 8.6 10*3/uL — ABNORMAL HIGH (ref 1.7–7.7)
Neutrophils Relative %: 84 %
Platelet Count: 447 10*3/uL — ABNORMAL HIGH (ref 150–400)
RBC: 3.83 MIL/uL — ABNORMAL LOW (ref 4.22–5.81)
RDW: 12.8 % (ref 11.5–15.5)
WBC Count: 10.1 10*3/uL (ref 4.0–10.5)
nRBC: 0 % (ref 0.0–0.2)

## 2022-05-15 LAB — CMP (CANCER CENTER ONLY)
ALT: 15 U/L (ref 0–44)
AST: 15 U/L (ref 15–41)
Albumin: 3.2 g/dL — ABNORMAL LOW (ref 3.5–5.0)
Alkaline Phosphatase: 116 U/L (ref 38–126)
Anion gap: 8 (ref 5–15)
BUN: 9 mg/dL (ref 8–23)
CO2: 27 mmol/L (ref 22–32)
Calcium: 8.9 mg/dL (ref 8.9–10.3)
Chloride: 102 mmol/L (ref 98–111)
Creatinine: 1.01 mg/dL (ref 0.61–1.24)
GFR, Estimated: >60 mL/min (ref >60)
Glucose, Bld: 113 mg/dL — ABNORMAL HIGH (ref 70–99)
Potassium: 3.3 mmol/L — ABNORMAL LOW (ref 3.5–5.1)
Sodium: 137 mmol/L (ref 135–145)
Total Bilirubin: 0.5 mg/dL (ref 0.3–1.2)
Total Protein: 6.8 g/dL (ref 6.5–8.1)

## 2022-05-15 MED ORDER — SODIUM CHLORIDE 0.9 % IV SOLN: Freq: Once | INTRAVENOUS | Status: AC

## 2022-05-15 MED ORDER — SODIUM CHLORIDE 0.9 % IV SOLN
1500.0000 mg | Freq: Once | INTRAVENOUS | Status: AC
  Administered 2022-05-15: 1500 mg via INTRAVENOUS
  Filled 2022-05-15: qty 30

## 2022-05-15 NOTE — Progress Notes (Signed)
Oncology Nurse Navigator Documentation     05/15/2022   12:00 PM 04/16/2022    1:00 PM 04/08/2022    3:00 PM 01/23/2022   12:00 PM 01/18/2022   10:00 AM 01/17/2022    3:00 PM 01/16/2022    4:00 PM  Oncology Nurse Navigator Flowsheets  Planned Course of Treatment      Chemo/Radiation Concurrent   Phase of Treatment    Radiation  Chemo   Chemotherapy Actual Start Date:    01/28/2022     Radiation Actual Start Date:    01/28/2022     Navigator Follow Up Date:   05/13/2022 02/05/2022  01/21/2022   Navigator Follow Up Reason:   Change in Treatment Follow-up Appointment  Appointment Review   Navigator Location [facility]  Navigator Encounter Type Clinic/MDC Appt/Treatment Plan Review Clinic/MDC Pathology Review Appt/Treatment Plan Review Clinic/MDC;Initial MedOnc Other:  Treatment Initiated Date    01/28/2022     Patient Visit Type  Other MedOnc Other Other Initial;MedOnc   Treatment Phase Treatment Treatment;Other Treatment;Other Pre-Tx/Tx Discussion Pre-Tx/Tx Discussion Pre-Tx/Tx Discussion Pre-Tx/Tx Discussion  Barriers/Navigation Needs Education/Patient's family had questions about patient treatment medication. Dr. Julien Nordmann explained. I added information on medication to patients AVS and updated patient and family.  Coordination of Care Education Coordination of Care Coordination of Care Education Coordination of Care  Education Other  Other   Newly Diagnosed Cancer Education;Understanding Cancer/ Treatment Options;Other   Interventions Education;Psycho-Social Support Coordination of Care Psycho-Social Support;Education Coordination of Care Coordination of Care Education;Psycho-Social Support Coordination of Care  Acuity Level 2-Minimal Needs (1-2 Barriers Identified) Level 2-Minimal Needs (1-2 Barriers Identified) Level 2-Minimal Needs (1-2 Barriers Identified) Level 2-Minimal Needs (1-2 Barriers  Identified) Level 2-Minimal Needs (1-2 Barriers Identified) Level 3-Moderate Needs (3-4 Barriers Identified) Level 2-Minimal Needs (1-2 Barriers Identified)  Coordination of Care  Other  Pathology Other  Pathology  Education Method Verbal;Other  Verbal;Other   Verbal;Other   Time Spent with Patient 30 15 30 30 30 30  15

## 2022-05-15 NOTE — Progress Notes (Signed)
[place] [phone]   [phone]  OFFICE PROGRESS NOTE  Shirline Frees, MD [place] [address]  DIAGNOSIS: Stage IIIb (T4, N2, M0) non-small cell lung cancer, squamous cell carcinoma presented with large left upper lobe lung mass with left hilar and subcarinal lymphadenopathy diagnosed in February 2023.  Molecular study by [employer] 360 DETECTED ALTERATION(S) / BIOMARKER(S) % CFDNA OR AMPLIFICATION ASSOCIATED FDA-APPROVED THERAPIES CLINICAL TRIAL AVAILABILITY [id number] 4.4% None  Yes [id number] 1.1% None  Yes  PDL1 Expression 90%  PRIOR THERAPY:A course of concurrent chemoradiation with weekly carboplatin for AUC of 2 and paclitaxel 45 Mg/M2.  Status post 6 cycles.  Last dose was given March 04, 2022 with partial response.  CURRENT THERAPY: Consolidation treatment with immunotherapy with Imfinzi 1500 Mg IV every 4 weeks.  First dose Apr 17, 2022.  Status post 1 cycle.  INTERVAL HISTORY: Peter Diaz 71 y.o. male returns to the clinic today for follow-up visit accompanied by his daughter. His wife was also available by phone during the visit.  The patient is feeling fine today with no concerning complaints except for intermittent dry cough.  He denied having any nausea, vomiting but has few episodes of diarrhea few weeks ago and completely resolved.  He has no abdominal pain or constipation.  He has no headache or visual changes.  He lost few pounds since his last visit because of poor appetite.  The patient tolerated the first cycle of his treatment with Imfinzi fairly well.  He is here for evaluation before starting cycle #2.   MEDICAL HISTORY: Past Medical History:  Diagnosis Date   GERD (gastroesophageal reflux disease)    Gout    History of radiation therapy    Left lung- (01/28/22-03/08/22)- Dr. Gery Pray   HTN (hypertension)     ALLERGIES:  has No Known Allergies.  MEDICATIONS:  Current  Outpatient Medications  Medication Sig Dispense Refill   amLODipine (NORVASC) 10 MG tablet Take 10 mg by mouth daily.     esomeprazole (NEXIUM) 20 MG capsule Take 20 mg by mouth daily at 12 noon.     ibuprofen (ADVIL) 200 MG tablet Take 200 mg by mouth as needed.     tamsulosin (FLOMAX) 0.4 MG CAPS capsule Take 0.4 mg by mouth at bedtime.     albuterol (VENTOLIN HFA) 108 (90 Base) MCG/ACT inhaler Inhale 2 puffs into the lungs every 6 (six) hours as needed for wheezing or shortness of breath. (Patient not taking: Reported on 05/15/2022) 8 g 6   prochlorperazine (COMPAZINE) 10 MG tablet Take 1 tablet (10 mg total) by mouth every 6 (six) hours as needed for nausea or vomiting. (Patient not taking: Reported on 01/21/2022) 30 tablet 0   sucralfate (CARAFATE) 1 g tablet Take 1 tablet (1 g total) by mouth 4 (four) times daily -  with meals and at bedtime. (Patient not taking: Reported on 04/15/2022) 120 tablet 1   No current facility-administered medications for this visit.    SURGICAL HISTORY:  Past Surgical History:  Procedure Laterality Date   BIOPSY  01/11/2022   Procedure: BIOPSY;  Surgeon: Garner Nash, DO;  Location: [facility];  Service: Pulmonary;;   BRAIN SURGERY     BRONCHIAL BRUSHINGS  01/11/2022   Procedure: BRONCHIAL BRUSHINGS;  Surgeon: Garner Nash, DO;  Location: [facility];  Service: Pulmonary;;   BRONCHIAL NEEDLE ASPIRATION BIOPSY  01/11/2022   Procedure: BRONCHIAL NEEDLE ASPIRATION BIOPSIES;  Surgeon: Garner Nash, DO;  Location: [facility];  Service: Pulmonary;;   VIDEO BRONCHOSCOPY WITH ENDOBRONCHIAL ULTRASOUND Left 01/11/2022   Procedure: VIDEO BRONCHOSCOPY WITH ENDOBRONCHIAL ULTRASOUND;  Surgeon: Garner Nash, DO;  Location: [facility];  Service: Pulmonary;  Laterality: Left;    REVIEW OF SYSTEMS:  A comprehensive review of systems was negative except for: Constitutional: positive for fatigue and weight loss Respiratory: positive for cough Gastrointestinal:  positive for diarrhea Musculoskeletal: positive for arthralgias   PHYSICAL EXAMINATION: General appearance: alert, cooperative, fatigued, and no distress Head: Normocephalic, without obvious abnormality, atraumatic Neck: no adenopathy, no JVD, supple, symmetrical, trachea midline, and thyroid not enlarged, symmetric, no tenderness/mass/nodules Lymph nodes: Cervical, supraclavicular, and axillary nodes normal. Resp: clear to auscultation bilaterally Back: symmetric, no curvature. ROM normal. No CVA tenderness. Cardio: regular rate and rhythm, S1, S2 normal, no murmur, click, rub or gallop GI: soft, non-tender; bowel sounds normal; no masses,  no organomegaly Extremities: extremities normal, atraumatic, no cyanosis or edema  ECOG PERFORMANCE STATUS: 1 - Symptomatic but completely ambulatory  Blood pressure 110/68, pulse (!) 52, temperature 97.6 F (36.4 C), temperature source Tympanic, resp. rate 17, weight 153 lb 7 oz (69.6 kg), SpO2 97 %.  LABORATORY DATA: Lab Results  Component Value Date   WBC 10.1 05/15/2022   HGB 12.0 (L) 05/15/2022   HCT 36.3 (L) 05/15/2022   MCV 94.8 05/15/2022   PLT 447 (H) 05/15/2022      Chemistry      Component Value Date/Time   NA 137 05/15/2022 1050   K 3.3 (L) 05/15/2022 1050   CL 102 05/15/2022 1050   CO2 27 05/15/2022 1050   BUN 9 05/15/2022 1050   CREATININE 1.01 05/15/2022 1050      Component Value Date/Time   CALCIUM 8.9 05/15/2022 1050   ALKPHOS 116 05/15/2022 1050   AST 15 05/15/2022 1050   ALT 15 05/15/2022 1050   BILITOT 0.5 05/15/2022 1050       RADIOGRAPHIC STUDIES: No results found.  ASSESSMENT AND PLAN: This is a very pleasant 71 years old white male diagnosed with stage IIIb (T4, N2, M0) non-small cell lung cancer, squamous cell carcinoma presented with large left upper lobe lung mass in addition to left hilar and subcarinal lymphadenopathy diagnosed in February 2023.  The patient has no actionable mutation and PD-L1  expression is 90%. He had MRI of the brain performed recently that showed no concerning findings for disease metastasis to the brain. The patient completed a course of concurrent chemoradiation with weekly carboplatin for AUC of 2 and paclitaxel 45 Mg/M2 status post 6 week of treatment.  The last dose of his treatment was given on March 04, 2022 with partial response.  He tolerated this treatment well except for radiation-induced esophagitis with odynophagia. He is currently undergoing consolidation treatment with Imfinzi 1500 Mg IV every 4 weeks, status post 1 cycle. He tolerated the first cycle of his treatment well with no concerning adverse effects.  I recommended for the patient to proceed with cycle #2 today as planned. For the cough he will take Delsym over-the-counter. The patient was advised to call immediately if he has any other concerning symptoms in the interval. The patient voices understanding of current disease status and treatment options and is in agreement with the current care plan.  All questions were answered. The patient knows to call the clinic with any problems, questions or concerns. We can certainly see the patient much sooner if necessary.  The total  time spent in the appointment was 20 minutes.  Disclaimer: This note was dictated with voice recognition software. Similar sounding words can inadvertently be transcribed and may not be corrected upon review.

## 2022-05-15 NOTE — Patient Instructions (Signed)
Durvalumab injection What is this medication? DURVALUMAB (dur VAL ue mab) is a monoclonal antibody. It is used to treat lung cancer. This medicine may be used for other purposes; ask your health care provider or pharmacist if you have questions. COMMON BRAND NAME(S): IMFINZI What should I tell my care team before I take this medication? They need to know if you have any of these conditions: autoimmune diseases like Crohn's disease, ulcerative colitis, or lupus have had or planning to have an allogeneic stem cell transplant (uses someone else's stem cells) history of organ transplant history of radiation to the chest nervous system problems like myasthenia gravis or Guillain-Barre syndrome an unusual or allergic reaction to durvalumab, other medicines, foods, dyes, or preservatives pregnant or trying to get pregnant breast-feeding How should I use this medication? This medicine is for infusion into a vein. It is given by a health care professional in a hospital or clinic setting. A special MedGuide will be given to you before each treatment. Be sure to read this information carefully each time. Talk to your pediatrician regarding the use of this medicine in children. Special care may be needed. Overdosage: If you think you have taken too much of this medicine contact a poison control center or emergency room at once. NOTE: This medicine is only for you. Do not share this medicine with others. What if I miss a dose? It is important not to miss your dose. Call your doctor or health care professional if you are unable to keep an appointment. What may interact with this medication? Interactions have not been studied. This list may not describe all possible interactions. Give your health care provider a list of all the medicines, herbs, non-prescription drugs, or dietary supplements you use. Also tell them if you smoke, drink alcohol, or use illegal drugs. Some items may interact with your  medicine. What should I watch for while using this medication? This medication may make you feel generally unwell. Continue your course of treatment even though you feel ill unless your care team tells you to stop. You may need blood work done while you are taking this medication. Do not become pregnant while taking this medication or for 3 months after stopping it. Women should inform their care team if they wish to become pregnant or think they might be pregnant. There is a potential for serious side effects to an unborn child. Talk to your care team or pharmacist for more information. Do not breast-feed an infant while taking this medication or for 3 months after stopping it. What side effects may I notice from receiving this medication? Side effects that you should report to your care team as soon as possible: Allergic reactions--skin rash, itching, hives, swelling of the face, lips, tongue, or throat Bloody or watery diarrhea Dizziness, loss of balance or coordination, confusion or trouble speaking Dry cough, shortness of breath or trouble breathing Flushing, mostly over the face, neck, and chest, during injection High blood sugar (hyperglycemia)--increased thirst or amount of urine, unusual weakness or fatigue, blurry vision High thyroid levels (hyperthyroidism)--fast or irregular heartbeat, weight loss, excessive sweating or sensitivity to heat, tremors or shaking, anxiety, nervousness, irregular menstrual cycle or spotting Infection--fever, chills, cough, or sore throat Liver injury--right upper belly pain, loss of appetite, nausea, light-colored stool, dark yellow or brown urine, yellowing skin or eyes, unusual weakness or fatigue Low adrenal gland function--nausea, vomiting, loss of appetite, unusual weakness or fatigue, dizziness, low blood pressure Low thyroid levels (hypothyroidism)--unusual weakness or fatigue,  increased sensitivity to cold, constipation, hair loss, dry skin, weight  gain, feelings of depression Pancreatitis--severe stomach pain that spreads to your back or gets worse after eating or when touched, fever, nausea, vomiting Rash, fever, and swollen lymph nodes Redness, blistering, peeling or loosening of the skin, including inside the mouth Wheezing--trouble breathing with loud or whistling sounds Side effects that usually do not require medical attention (report these to your care team if they continue or are bothersome): Fatigue Hair loss This list may not describe all possible side effects. Call your doctor for medical advice about side effects. You may report side effects to FDA at 1-800-FDA-1088. Where should I keep my medication? This medication is given in a hospital or clinic. It will not be stored at home. NOTE: This sheet is a summary. It may not cover all possible information. If you have questions about this medicine, talk to your doctor, pharmacist, or health care provider.  2023 Elsevier/Gold Standard (2021-10-26 00:00:00)

## 2022-05-17 ENCOUNTER — Encounter: Payer: Self-pay | Admitting: *Deleted

## 2022-05-17 NOTE — Progress Notes (Signed)
Oncology Nurse Navigator Documentation Patient is receiving maintenance IO therapy and has this treatment plan scheduled at this time. No oncology navigation needs identified.     05/17/2022   11:00 AM 05/15/2022   12:00 PM 04/16/2022    1:00 PM 04/08/2022    3:00 PM 01/23/2022   12:00 PM 01/18/2022   10:00 AM 01/17/2022    3:00 PM  Oncology Nurse Navigator Flowsheets  Planned Course of Treatment       Chemo/Radiation Concurrent  Phase of Treatment Radiation    Radiation  Chemo  Chemotherapy Actual Start Date:     01/28/2022    Chemotherapy Actual End Date: 03/04/2022        Radiation Actual Start Date:     01/28/2022    Radiation Actual End Date: 03/08/2022        Targeted Therapy Actual Start Date: 04/17/2022        Navigator Follow Up Date:    05/13/2022 02/05/2022  01/21/2022  Navigator Follow Up Reason:    Change in Treatment Follow-up Appointment  Appointment Review  Navigation Complete Date: 05/17/2022        Post Navigation: Continue to Follow Patient? No        Reason Not Navigating Patient: Patient On Maintenance Chemotherapy        Navigator Location [facility]  Navigator Encounter Type Appt/Treatment Plan Review Clinic/MDC Appt/Treatment Plan Review Clinic/MDC Pathology Review Appt/Treatment Plan Review Clinic/MDC;Initial MedOnc  Treatment Initiated Date     01/28/2022    Patient Visit Type   Other MedOnc Other Other Initial;MedOnc  Treatment Phase Treatment Treatment Treatment;Other Treatment;Other Pre-Tx/Tx Discussion Pre-Tx/Tx Discussion Pre-Tx/Tx Discussion  Barriers/Navigation Needs Coordination of Care Education Coordination of Care Education Coordination of Care Coordination of Care Education  Education  Other  Other   Newly Diagnosed Cancer Education;Understanding Cancer/ Treatment Options;Other  Interventions Coordination of Care Education;Psycho-Social Support Coordination of Care  Psycho-Social Support;Education Coordination of Care Coordination of Care Education;Psycho-Social Support  Acuity Level 2-Minimal Needs (1-2 Barriers Identified) Level 2-Minimal Needs (1-2 Barriers Identified) Level 2-Minimal Needs (1-2 Barriers Identified) Level 2-Minimal Needs (1-2 Barriers Identified) Level 2-Minimal Needs (1-2 Barriers Identified) Level 2-Minimal Needs (1-2 Barriers Identified) Level 3-Moderate Needs (3-4 Barriers Identified)  Coordination of Care Other  Other  Pathology Other   Education Method  Verbal;Other  Verbal;Other   Verbal;Other  Time Spent with Patient 30 30 15 30 30 30  30

## 2022-06-07 NOTE — Progress Notes (Unsigned)
[facility] OFFICE PROGRESS NOTE  Shirline Frees, MD [place] [address]  DIAGNOSIS: Stage IIIb (T4, N2, M0) non-small cell lung cancer, squamous cell carcinoma presented with large left upper lobe lung mass with left hilar and subcarinal lymphadenopathy diagnosed in February 2023.   Molecular study by [employer] 360 DETECTED ALTERATION(S) / BIOMARKER(S)      % CFDNA OR AMPLIFICATION        ASSOCIATED FDA-APPROVED THERAPIES         CLINICAL TRIAL AVAILABILITY [id number] 4.4% None     Yes [id number] 1.1% None     Yes   PDL1 Expression 90%  PRIOR THERAPY: A course of concurrent chemoradiation with weekly carboplatin for AUC of 2 and paclitaxel 45 Mg/M2.  Status post 6 cycles.  Last dose was given March 04, 2022 with partial response.  CURRENT THERAPY:  Consolidation treatment with immunotherapy with Imfinzi 1500 Mg IV every 4 weeks.  First dose Apr 17, 2022.  Status post 2 cycles.  INTERVAL HISTORY: VASILIS LUHMAN 71 y.o. male returns to the clinic today for follow-up visit.  The patient is feeling fairly well today without any concerning complaints.  The patient is currently undergoing immunotherapy with Imfinzi.  He is tolerating this well without any concerning adverse side effects.  He denies any fever, chills, or night sweats.  He previously was having a decreased appetite and weight loss which has ***at this time.  He reports mild dyspnea on exertion and an intermittent dry cough.  Denies any chest pain or hemoptysis.  Denies any nausea, vomiting, diarrhea, or constipation.  Denies any headache or visual changes.  Denies any rashes or skin changes.  He is here today for evaluation and repeat blood work before undergoing cycle #3.    MEDICAL HISTORY: Past Medical History:  Diagnosis Date   GERD (gastroesophageal reflux disease)    Gout    History of radiation therapy    Left lung- (01/28/22-03/08/22)- Dr. Gery Pray   HTN  (hypertension)     ALLERGIES:  has No Known Allergies.  MEDICATIONS:  Current Outpatient Medications  Medication Sig Dispense Refill   albuterol (VENTOLIN HFA) 108 (90 Base) MCG/ACT inhaler Inhale 2 puffs into the lungs every 6 (six) hours as needed for wheezing or shortness of breath. (Patient not taking: Reported on 05/15/2022) 8 g 6   amLODipine (NORVASC) 10 MG tablet Take 10 mg by mouth daily.     esomeprazole (NEXIUM) 20 MG capsule Take 20 mg by mouth daily at 12 noon.     ibuprofen (ADVIL) 200 MG tablet Take 200 mg by mouth as needed.     prochlorperazine (COMPAZINE) 10 MG tablet Take 1 tablet (10 mg total) by mouth every 6 (six) hours as needed for nausea or vomiting. (Patient not taking: Reported on 01/21/2022) 30 tablet 0   sucralfate (CARAFATE) 1 g tablet Take 1 tablet (1 g total) by mouth 4 (four) times daily -  with meals and at bedtime. (Patient not taking: Reported on 04/15/2022) 120 tablet 1   tamsulosin (FLOMAX) 0.4 MG CAPS capsule Take 0.4 mg by mouth at bedtime.     No current facility-administered medications for this visit.    SURGICAL HISTORY:  Past Surgical History:  Procedure Laterality Date   BIOPSY  01/11/2022   Procedure: BIOPSY;  Surgeon: Garner Nash, DO;  Location: [facility];  Service: Pulmonary;;   BRAIN SURGERY     BRONCHIAL BRUSHINGS  01/11/2022  Procedure: BRONCHIAL BRUSHINGS;  Surgeon: Garner Nash, DO;  Location: [facility];  Service: Pulmonary;;   BRONCHIAL NEEDLE ASPIRATION BIOPSY  01/11/2022   Procedure: BRONCHIAL NEEDLE ASPIRATION BIOPSIES;  Surgeon: Garner Nash, DO;  Location: [facility];  Service: Pulmonary;;   VIDEO BRONCHOSCOPY WITH ENDOBRONCHIAL ULTRASOUND Left 01/11/2022   Procedure: VIDEO BRONCHOSCOPY WITH ENDOBRONCHIAL ULTRASOUND;  Surgeon: Garner Nash, DO;  Location: [facility];  Service: Pulmonary;  Laterality: Left;    REVIEW OF SYSTEMS:   Review of Systems  Constitutional: Negative for appetite change, chills,  fatigue, fever and unexpected weight change.  HENT:   Negative for mouth sores, nosebleeds, sore throat and trouble swallowing.   Eyes: Negative for eye problems and icterus.  Respiratory: Negative for cough, hemoptysis, shortness of breath and wheezing.   Cardiovascular: Negative for chest pain and leg swelling.  Gastrointestinal: Negative for abdominal pain, constipation, diarrhea, nausea and vomiting.  Genitourinary: Negative for bladder incontinence, difficulty urinating, dysuria, frequency and hematuria.   Musculoskeletal: Negative for back pain, gait problem, neck pain and neck stiffness.  Skin: Negative for itching and rash.  Neurological: Negative for dizziness, extremity weakness, gait problem, headaches, light-headedness and seizures.  Hematological: Negative for adenopathy. Does not bruise/bleed easily.  Psychiatric/Behavioral: Negative for confusion, depression and sleep disturbance. The patient is not nervous/anxious.     PHYSICAL EXAMINATION:  There were no vitals taken for this visit.  ECOG PERFORMANCE STATUS: {CHL ONC ECOG [id number]  Physical Exam  Constitutional: Oriented to person, place, and time and well-developed, well-nourished, and in no distress. No distress.  HENT:  Head: Normocephalic and atraumatic.  Mouth/Throat: Oropharynx is clear and moist. No oropharyngeal exudate.  Eyes: Conjunctivae are normal. Right eye exhibits no discharge. Left eye exhibits no discharge. No scleral icterus.  Neck: Normal range of motion. Neck supple.  Cardiovascular: Normal rate, regular rhythm, normal heart sounds and intact distal pulses.   Pulmonary/Chest: Effort normal and breath sounds normal. No respiratory distress. No wheezes. No rales.  Abdominal: Soft. Bowel sounds are normal. Exhibits no distension and no mass. There is no tenderness.  Musculoskeletal: Normal range of motion. Exhibits no edema.  Lymphadenopathy:    No cervical adenopathy.  Neurological: Alert and  oriented to person, place, and time. Exhibits normal muscle tone. Gait normal. Coordination normal.  Skin: Skin is warm and dry. No rash noted. Not diaphoretic. No erythema. No pallor.  Psychiatric: Mood, memory and judgment normal.  Vitals reviewed.  LABORATORY DATA: Lab Results  Component Value Date   WBC 10.1 05/15/2022   HGB 12.0 (L) 05/15/2022   HCT 36.3 (L) 05/15/2022   MCV 94.8 05/15/2022   PLT 447 (H) 05/15/2022      Chemistry      Component Value Date/Time   NA 137 05/15/2022 1050   K 3.3 (L) 05/15/2022 1050   CL 102 05/15/2022 1050   CO2 27 05/15/2022 1050   BUN 9 05/15/2022 1050   CREATININE 1.01 05/15/2022 1050      Component Value Date/Time   CALCIUM 8.9 05/15/2022 1050   ALKPHOS 116 05/15/2022 1050   AST 15 05/15/2022 1050   ALT 15 05/15/2022 1050   BILITOT 0.5 05/15/2022 1050       RADIOGRAPHIC STUDIES:  No results found.   ASSESSMENT/PLAN:  This is a very pleasant 71 year old Caucasian male diagnosed with stage IIIb (T4, N2, M0) non-small cell lung cancer, squamous cell carcinoma.  The patient presented with a left upper lobe mass in addition to left hilar and  subcarinal lymphadenopathy.  He was diagnosed in February 2023.  The patient has no actionable mutations and his PD-L1 expression is 90%.  The patient completed a course of concurrent chemoradiation with weekly carboplatin for an AUC of 2 and paclitaxel 45 mg per metered squared.  He status post 6 cycles.  His last dose of treatment was given on 03/04/2022.  He had a partial response to treatment.  The patient is currently undergoing consolidation immunotherapy with Imfinzi 1500 mg IV every 4 weeks.  He is status post 2 cycles.  The patient was seen with Dr. Julien Nordmann today.  Labs were reviewed.  Recommend that he proceed with cycle #3 today scheduled.  I will arrange for restaging CT scan of the chest prior to starting his next cycle of treatment.  We will see him back for follow-up visit in 4  weeks for evaluation and repeat blood work and to review his scan results before starting cycle #4.  Cough?  The patient was advised to call immediately if she has any concerning symptoms in the interval. The patient voices understanding of current disease status and treatment options and is in agreement with the current care plan. All questions were answered. The patient knows to call the clinic with any problems, questions or concerns. We can certainly see the patient much sooner if necessary      No orders of the defined types were placed in this encounter.    I spent {CHL ONC TIME VISIT - [id number]} counseling the patient face to face. The total time spent in the appointment was {CHL ONC TIME VISIT - JJKKX:[id number]}.  Zooey Schreurs L Jeana Kersting, PA-C 06/07/22

## 2022-06-12 ENCOUNTER — Other Ambulatory Visit: Payer: Self-pay

## 2022-06-12 ENCOUNTER — Inpatient Hospital Stay: Payer: PPO | Attending: Radiation Oncology

## 2022-06-12 ENCOUNTER — Inpatient Hospital Stay (HOSPITAL_BASED_OUTPATIENT_CLINIC_OR_DEPARTMENT_OTHER): Payer: PPO | Admitting: Physician Assistant

## 2022-06-12 ENCOUNTER — Inpatient Hospital Stay: Payer: PPO

## 2022-06-12 VITALS — BP 110/68 | HR 103 | Temp 97.2°F | Resp 16 | Ht 68.0 in | Wt 149.7 lb

## 2022-06-12 VITALS — BP 116/70 | HR 78 | Resp 16

## 2022-06-12 DIAGNOSIS — C3412 Malignant neoplasm of upper lobe, left bronchus or lung: Secondary | ICD-10-CM | POA: Insufficient documentation

## 2022-06-12 DIAGNOSIS — C3432 Malignant neoplasm of lower lobe, left bronchus or lung: Secondary | ICD-10-CM

## 2022-06-12 DIAGNOSIS — Z5112 Encounter for antineoplastic immunotherapy: Secondary | ICD-10-CM | POA: Diagnosis not present

## 2022-06-12 DIAGNOSIS — Z79899 Other long term (current) drug therapy: Secondary | ICD-10-CM | POA: Insufficient documentation

## 2022-06-12 DIAGNOSIS — R630 Anorexia: Secondary | ICD-10-CM | POA: Diagnosis not present

## 2022-06-12 DIAGNOSIS — Z9221 Personal history of antineoplastic chemotherapy: Secondary | ICD-10-CM | POA: Insufficient documentation

## 2022-06-12 DIAGNOSIS — Z923 Personal history of irradiation: Secondary | ICD-10-CM | POA: Insufficient documentation

## 2022-06-12 LAB — CBC WITH DIFFERENTIAL (CANCER CENTER ONLY)
Abs Immature Granulocytes: 0.04 10*3/uL (ref 0.00–0.07)
Basophils Absolute: 0 10*3/uL (ref 0.0–0.1)
Basophils Relative: 1 %
Eosinophils Absolute: 0.1 10*3/uL (ref 0.0–0.5)
Eosinophils Relative: 1 %
HCT: 38.4 % — ABNORMAL LOW (ref 39.0–52.0)
Hemoglobin: 12.6 g/dL — ABNORMAL LOW (ref 13.0–17.0)
Immature Granulocytes: 1 %
Lymphocytes Relative: 9 %
Lymphs Abs: 0.6 10*3/uL — ABNORMAL LOW (ref 0.7–4.0)
MCH: 29.2 pg (ref 26.0–34.0)
MCHC: 32.8 g/dL (ref 30.0–36.0)
MCV: 88.9 fL (ref 80.0–100.0)
Monocytes Absolute: 0.6 10*3/uL (ref 0.1–1.0)
Monocytes Relative: 9 %
Neutro Abs: 5.4 10*3/uL (ref 1.7–7.7)
Neutrophils Relative %: 79 %
Platelet Count: 414 10*3/uL — ABNORMAL HIGH (ref 150–400)
RBC: 4.32 MIL/uL (ref 4.22–5.81)
RDW: 13.2 % (ref 11.5–15.5)
WBC Count: 6.9 10*3/uL (ref 4.0–10.5)
nRBC: 0 % (ref 0.0–0.2)

## 2022-06-12 LAB — CMP (CANCER CENTER ONLY)
ALT: 6 U/L (ref 0–44)
AST: 6 U/L — ABNORMAL LOW (ref 15–41)
Albumin: 3.4 g/dL — ABNORMAL LOW (ref 3.5–5.0)
Alkaline Phosphatase: 119 U/L (ref 38–126)
Anion gap: 8 (ref 5–15)
BUN: 10 mg/dL (ref 8–23)
CO2: 27 mmol/L (ref 22–32)
Calcium: 8.7 mg/dL — ABNORMAL LOW (ref 8.9–10.3)
Chloride: 104 mmol/L (ref 98–111)
Creatinine: 1.03 mg/dL (ref 0.61–1.24)
GFR, Estimated: >60 mL/min (ref >60)
Glucose, Bld: 114 mg/dL — ABNORMAL HIGH (ref 70–99)
Potassium: 3.3 mmol/L — ABNORMAL LOW (ref 3.5–5.1)
Sodium: 139 mmol/L (ref 135–145)
Total Bilirubin: 0.4 mg/dL (ref 0.3–1.2)
Total Protein: 6.8 g/dL (ref 6.5–8.1)

## 2022-06-12 MED ORDER — MIRTAZAPINE 7.5 MG PO TABS: 7.5000 mg | ORAL_TABLET | Freq: Every day | ORAL | 2 refills | Status: DC

## 2022-06-12 MED ORDER — SODIUM CHLORIDE 0.9 % IV SOLN
1500.0000 mg | Freq: Once | INTRAVENOUS | Status: AC
  Administered 2022-06-12: 1500 mg via INTRAVENOUS
  Filled 2022-06-12: qty 30

## 2022-06-12 MED ORDER — SODIUM CHLORIDE 0.9 % IV SOLN: Freq: Once | INTRAVENOUS | Status: AC

## 2022-06-12 NOTE — Patient Instructions (Signed)
[facility] ONCOLOGY   Discharge Instructions: Thank you for choosing [facility] to provide your oncology and hematology care.   If you have a lab appointment with the [facility], please go directly to the [facility] and check in at the registration area.   Wear comfortable clothing and clothing appropriate for easy access to any Portacath or PICC line.   We strive to give you quality time with your provider. You may need to reschedule your appointment if you arrive late (15 or more minutes).  Arriving late affects you and other patients whose appointments are after yours.  Also, if you miss three or more appointments without notifying the office, you may be dismissed from the clinic at the provider's discretion.      For prescription refill requests, have your pharmacy contact our office and allow 72 hours for refills to be completed.    Today you received the following chemotherapy and/or immunotherapy agents: Durvalumab (Imfinzi)      To help prevent nausea and vomiting after your treatment, we encourage you to take your nausea medication as directed.  BELOW ARE SYMPTOMS THAT SHOULD BE REPORTED IMMEDIATELY: *FEVER GREATER THAN 100.4 F (38 C) OR HIGHER *CHILLS OR SWEATING *NAUSEA AND VOMITING THAT IS NOT CONTROLLED WITH YOUR NAUSEA MEDICATION *UNUSUAL SHORTNESS OF BREATH *UNUSUAL BRUISING OR BLEEDING *URINARY PROBLEMS (pain or burning when urinating, or frequent urination) *BOWEL PROBLEMS (unusual diarrhea, constipation, pain near the anus) TENDERNESS IN MOUTH AND THROAT WITH OR WITHOUT PRESENCE OF ULCERS (sore throat, sores in mouth, or a toothache) UNUSUAL RASH, SWELLING OR PAIN  UNUSUAL VAGINAL DISCHARGE OR ITCHING   Items with * indicate a potential emergency and should be followed up as soon as possible or go to the Emergency Department if any problems should occur.  Please show the CHEMOTHERAPY ALERT CARD or IMMUNOTHERAPY ALERT CARD at  check-in to the Emergency Department and triage nurse.  Should you have questions after your visit or need to cancel or reschedule your appointment, please contact [facility]  Dept: [phone]  and follow the prompts.  Office hours are 8:00 a.m. to 4:30 p.m. Monday - Friday. Please note that voicemails left after 4:00 p.m. may not be returned until the following business day.  We are closed weekends and major holidays. You have access to a nurse at all times for urgent questions. Please call the main number to the clinic Dept: [phone] and follow the prompts.   For any non-urgent questions, you may also contact your provider using MyChart. We now offer e-Visits for anyone 71 and older to request care online for non-urgent symptoms. For details visit [email or web address].   Also download the MyChart app! Go to the app store, search "MyChart", open the app, select [facility], and log in with your MyChart username and password.  Masks are optional in the cancer centers. If you would like for your care team to wear a mask while they are taking care of you, please let them know. For doctor visits, patients may have with them one support person who is at least 72 years old. At this time, visitors are not allowed in the infusion area.

## 2022-06-28 ENCOUNTER — Encounter: Payer: Self-pay | Admitting: Internal Medicine

## 2022-07-01 ENCOUNTER — Other Ambulatory Visit: Payer: Self-pay

## 2022-07-05 ENCOUNTER — Ambulatory Visit (HOSPITAL_COMMUNITY)
Admission: RE | Admit: 2022-07-05 | Discharge: 2022-07-05 | Disposition: A | Discharge status: Home / Self Care | Payer: PPO | Source: Ambulatory Visit | Attending: Physician Assistant | Admitting: Physician Assistant

## 2022-07-05 DIAGNOSIS — J181 Lobar pneumonia, unspecified organism: Secondary | ICD-10-CM | POA: Diagnosis not present

## 2022-07-05 DIAGNOSIS — C4492 Squamous cell carcinoma of skin, unspecified: Secondary | ICD-10-CM | POA: Diagnosis not present

## 2022-07-05 DIAGNOSIS — J479 Bronchiectasis, uncomplicated: Secondary | ICD-10-CM | POA: Diagnosis not present

## 2022-07-05 DIAGNOSIS — J439 Emphysema, unspecified: Secondary | ICD-10-CM | POA: Diagnosis not present

## 2022-07-05 DIAGNOSIS — C3432 Malignant neoplasm of lower lobe, left bronchus or lung: Secondary | ICD-10-CM | POA: Insufficient documentation

## 2022-07-05 DIAGNOSIS — C3490 Malignant neoplasm of unspecified part of unspecified bronchus or lung: Secondary | ICD-10-CM | POA: Diagnosis not present

## 2022-07-05 MED ORDER — IOHEXOL 300 MG/ML  SOLN
75.0000 mL | Freq: Once | INTRAMUSCULAR | Status: AC | PRN
  Administered 2022-07-05: 75 mL via INTRAVENOUS

## 2022-07-05 MED ORDER — SODIUM CHLORIDE (PF) 0.9 % IJ SOLN
INTRAMUSCULAR | Status: AC
  Filled 2022-07-05: qty 50

## 2022-07-08 NOTE — Progress Notes (Unsigned)
[facility] OFFICE PROGRESS NOTE  Shirline Frees, MD [place] [address]  DIAGNOSIS: Stage IIIb (T4, N2, M0) non-small cell lung cancer, squamous cell carcinoma presented with large left upper lobe lung mass with left hilar and subcarinal lymphadenopathy diagnosed in February 2023.   Molecular study by [employer] DETECTED ALTERATION(S) / BIOMARKER(S)      % CFDNA OR AMPLIFICATION        ASSOCIATED FDA-APPROVED THERAPIES         CLINICAL TRIAL AVAILABILITY [id number] 4.4% None     Yes [id number] 1.1% None     Yes   PDL1 Expression 90%  PRIOR THERAPY:  A course of concurrent chemoradiation with weekly carboplatin for AUC of 2 and paclitaxel 45 Mg/M2.  Status post 6 cycles.  Last dose was given March 04, 2022 with partial response.  CURRENT THERAPY: Consolidation treatment with immunotherapy with Imfinzi 1500 Mg IV every 4 weeks.  First dose Apr 17, 2022.  Status post 3 cycles.  INTERVAL HISTORY: RYAAN VANWAGONER 71 y.o. male returns to the clinic today for a follow-up visit accompanied by his daughter.  The patient is feeling fairly well today without any concerning complaints.  The patient is currently undergoing immunotherapy with Imfinzi.  He is tolerating this well without any concerning adverse side effects.  He denies any fever, chills, or night sweats.  He previously was having a decreased appetite and weight loss. I started him on remeron at his last appointment which ***. He declined referral to a member of the nutritionist team.  He reports mild stable dyspnea on exertion if going up steps. He denies cough. Denies any chest pain or hemoptysis.  Denies any nausea, vomiting, diarrhea, or constipation.  Denies any headache or visual changes.  Denies any rashes or skin changes. He recently had a restaging CT scan. He is here today for evaluation and rto review his scan before undergoing cycle #4.     MEDICAL HISTORY: Past Medical  History:  Diagnosis Date   GERD (gastroesophageal reflux disease)    Gout    History of radiation therapy    Left lung- (01/28/22-03/08/22)- Dr. Gery Pray   HTN (hypertension)     ALLERGIES:  has No Known Allergies.  MEDICATIONS:  Current Outpatient Medications  Medication Sig Dispense Refill   albuterol (VENTOLIN HFA) 108 (90 Base) MCG/ACT inhaler Inhale 2 puffs into the lungs every 6 (six) hours as needed for wheezing or shortness of breath. 8 g 6   amLODipine (NORVASC) 10 MG tablet Take 10 mg by mouth daily.     esomeprazole (NEXIUM) 20 MG capsule Take 20 mg by mouth daily at 12 noon.     ibuprofen (ADVIL) 200 MG tablet Take 200 mg by mouth as needed.     mirtazapine (REMERON) 7.5 MG tablet Take 1 tablet (7.5 mg total) by mouth at bedtime. 30 tablet 2   prochlorperazine (COMPAZINE) 10 MG tablet Take 1 tablet (10 mg total) by mouth every 6 (six) hours as needed for nausea or vomiting. 30 tablet 0   sucralfate (CARAFATE) 1 g tablet Take 1 tablet (1 g total) by mouth 4 (four) times daily -  with meals and at bedtime. (Patient taking differently: Take 1 g by mouth 4 (four) times daily -  with meals and at bedtime. As needed) 120 tablet 1   tamsulosin (FLOMAX) 0.4 MG CAPS capsule Take 0.4 mg by mouth at bedtime.     No current  facility-administered medications for this visit.    SURGICAL HISTORY:  Past Surgical History:  Procedure Laterality Date   BIOPSY  01/11/2022   Procedure: BIOPSY;  Surgeon: Garner Nash, DO;  Location: [facility];  Service: Pulmonary;;   BRAIN SURGERY     BRONCHIAL BRUSHINGS  01/11/2022   Procedure: BRONCHIAL BRUSHINGS;  Surgeon: Garner Nash, DO;  Location: [facility];  Service: Pulmonary;;   BRONCHIAL NEEDLE ASPIRATION BIOPSY  01/11/2022   Procedure: BRONCHIAL NEEDLE ASPIRATION BIOPSIES;  Surgeon: Garner Nash, DO;  Location: [facility];  Service: Pulmonary;;   VIDEO BRONCHOSCOPY WITH ENDOBRONCHIAL ULTRASOUND Left 01/11/2022   Procedure: VIDEO  BRONCHOSCOPY WITH ENDOBRONCHIAL ULTRASOUND;  Surgeon: Garner Nash, DO;  Location: [facility];  Service: Pulmonary;  Laterality: Left;    REVIEW OF SYSTEMS:   Review of Systems  Constitutional: Negative for appetite change, chills, fatigue, fever and unexpected weight change.  HENT:   Negative for mouth sores, nosebleeds, sore throat and trouble swallowing.   Eyes: Negative for eye problems and icterus.  Respiratory: Negative for cough, hemoptysis, shortness of breath and wheezing.   Cardiovascular: Negative for chest pain and leg swelling.  Gastrointestinal: Negative for abdominal pain, constipation, diarrhea, nausea and vomiting.  Genitourinary: Negative for bladder incontinence, difficulty urinating, dysuria, frequency and hematuria.   Musculoskeletal: Negative for back pain, gait problem, neck pain and neck stiffness.  Skin: Negative for itching and rash.  Neurological: Negative for dizziness, extremity weakness, gait problem, headaches, light-headedness and seizures.  Hematological: Negative for adenopathy. Does not bruise/bleed easily.  Psychiatric/Behavioral: Negative for confusion, depression and sleep disturbance. The patient is not nervous/anxious.     PHYSICAL EXAMINATION:  There were no vitals taken for this visit.  ECOG PERFORMANCE STATUS: {CHL ONC ECOG [id number]  Physical Exam  Constitutional: Oriented to person, place, and time and well-developed, well-nourished, and in no distress. No distress.  HENT:  Head: Normocephalic and atraumatic.  Mouth/Throat: Oropharynx is clear and moist. No oropharyngeal exudate.  Eyes: Conjunctivae are normal. Right eye exhibits no discharge. Left eye exhibits no discharge. No scleral icterus.  Neck: Normal range of motion. Neck supple.  Cardiovascular: Normal rate, regular rhythm, normal heart sounds and intact distal pulses.   Pulmonary/Chest: Effort normal and breath sounds normal. No respiratory distress. No wheezes. No  rales.  Abdominal: Soft. Bowel sounds are normal. Exhibits no distension and no mass. There is no tenderness.  Musculoskeletal: Normal range of motion. Exhibits no edema.  Lymphadenopathy:    No cervical adenopathy.  Neurological: Alert and oriented to person, place, and time. Exhibits normal muscle tone. Gait normal. Coordination normal.  Skin: Skin is warm and dry. No rash noted. Not diaphoretic. No erythema. No pallor.  Psychiatric: Mood, memory and judgment normal.  Vitals reviewed.  LABORATORY DATA: Lab Results  Component Value Date   WBC 6.9 06/12/2022   HGB 12.6 (L) 06/12/2022   HCT 38.4 (L) 06/12/2022   MCV 88.9 06/12/2022   PLT 414 (H) 06/12/2022      Chemistry      Component Value Date/Time   NA 139 06/12/2022 0812   K 3.3 (L) 06/12/2022 0812   CL 104 06/12/2022 0812   CO2 27 06/12/2022 0812   BUN 10 06/12/2022 0812   CREATININE 1.03 06/12/2022 0812      Component Value Date/Time   CALCIUM 8.7 (L) 06/12/2022 0812   ALKPHOS 119 06/12/2022 0812   AST 6 (L) 06/12/2022 0812   ALT 6 06/12/2022 0812   BILITOT 0.4  06/12/2022 1610       RADIOGRAPHIC STUDIES:  CT Chest W Contrast  Result Date: 07/05/2022 CLINICAL DATA:  Primary Cancer Type: Lung Imaging Indication: Assess response to therapy Interval therapy since last imaging? Yes Initial Cancer Diagnosis Date: 01/11/2022; Established by: Biopsy-proven Detailed Pathology: Stage IIIb non-small cell lung cancer, squamous cell carcinoma. Primary Tumor location: Left upper lobe. Surgeries: No thoracic. Chemotherapy: Yes; Ongoing? No; Most recent administration: 03/04/2022 Immunotherapy?  Yes; Type: Imfinzi; Ongoing? Yes Radiation therapy? Yes; Date Range: 01/28/2022 - 03/08/2022; Target: Left lung * Tracking Code: BO * EXAM: CT CHEST WITH CONTRAST TECHNIQUE: Multidetector CT imaging of the chest was performed during intravenous contrast administration. RADIATION DOSE REDUCTION: This exam was performed according to the  departmental dose-optimization program which includes automated exposure control, adjustment of the mA and/or kV according to patient size and/or use of iterative reconstruction technique. CONTRAST:  72m OMNIPAQUE IOHEXOL 300 MG/ML  SOLN COMPARISON:  Most recent CT chest 04/04/2022.  01/08/2022 PET-CT.w FINDINGS: Cardiovascular: Calcified and noncalcified atheromatous plaque in the thoracic aorta similar to prior imaging. Heart size is stable. Nodularity along the LEFT heart border is increased since April of 2023. This is mixed with volume loss. See below. Central pulmonary vasculatures unremarkable aside from distortion of LEFT hilum following radiotherapy. Mediastinum/Nodes: Esophagus is normal. Thoracic inlet is unremarkable. No axillary adenopathy Small juxta pericardial lymph node (image 111/2) 9 mm previously 5 mm. Increased fullness about the LEFT hilum following radiotherapy blending with consolidation in the LEFT upper lobe no RIGHT hilar adenopathy. No internal mammary adenopathy. Lungs/Pleura: Background pulmonary emphysema is mild to moderate and worse at the lung apices. Bandlike consolidative changes emanate from the LEFT hilum following radiotherapy obscuring the LEFT hilar soft tissue seen on the previous study but with increased soft tissue in general about the treated area. New LEFT-sided pleural effusion is moderate volume a component of which is sub pulmonic. No overt enhancing nodularity within pleural fluid. Pleural fluid also tracks to the LEFT lung apex showing loculated features in the LEFT lung apex and partially loculated within the superior aspect of the major fissure coronal "nodularity along the LEFT heart border" seen in the axial plane with increasingly dense appearance of even more peripheral lung in the LEFT chest, appearing near masslike rather than bandlike on the coronal and sagittal images. Small RIGHT lower lobe pulmonary nodules (image 119/5) 4 mm. (Image 95/5) 7 mm. Upper  Abdomen: Liver is unremarkable. Gallbladder with cholelithiasis. No pericholecystic stranding. No biliary duct dilation. Cystic lesion in the head of the pancreas is stable (image 160/2) 18 x 16 mm. This is similar to the recent comparison from April but appears to have enlarged since January of 2023 where it measured approximately 13 x 13 mm. No signs of pancreatic duct dilation or inflammation. Spleen, adrenal glands and kidneys without acute process or signs of suspicious abnormality. No acute gastrointestinal findings to the extent evaluated. Aortic atherosclerosis in the abdominal aorta. Musculoskeletal: No acute bone finding or destructive bone process. IMPRESSION: LEFT upper lobe consolidation and bronchiectasis now with new LEFT-sided pleural effusion. Area surrounds the cavitary lesion about the LEFT hilum and soft tissue about the LEFT hilum that was seen previously. Findings more likely reflect post radiation changes given patient history. Findings are however slightly more masslike than expected in coronal and sagittal planes. Would suggest short interval follow-up or PET imaging to establish new baseline given the dramatic change. New small RIGHT lower lobe pulmonary nodules. No pathologically enlarged lymph nodes in  the chest. Slight interval enlargement of sub threshold lymph nodes as discussed. Stable cystic pancreatic lesion, attention on follow-up. This does appear to have enlarged however since imaging dating back to January of 2023 which is a relatively short interval. For this reason follow-up MRI/MRCP may be helpful. Findings could reflect intraductal papillary mucinous neoplasm. Electronically Signed   By: Zetta Bills M.D.   On: 07/05/2022 11:36     ASSESSMENT/PLAN:  This is a very pleasant 71 year old Caucasian male diagnosed with stage IIIb (T4, N2, M0) non-small cell lung cancer, squamous cell carcinoma.  The patient presented with a left upper lobe mass in addition to left hilar  and subcarinal lymphadenopathy.  He was diagnosed in February 2023.  The patient has no actionable mutations and his PD-L1 expression is 90%.  The patient completed a course of concurrent chemoradiation with weekly carboplatin for an AUC of 2 and paclitaxel 45 mg per metered squared.  He status post 6 cycles.  His last dose of treatment was given on 03/04/2022.  He had a partial response to treatment.  The patient is currently undergoing consolidation immunotherapy with Imfinzi 1500 mg IV every 4 weeks.  He is status post 3 cycles.  The patient recently had a restaging CT scan. Dr. Julien Nordmann personally and independently reviewed the scan and discussed the results with the patient. The scan showed ***. Recommend that ***  Pancreas***?   Labs were reviewed.  Recommend that he proceed with cycle #4 today scheduled.  Remeron dose?  The patient was advised to call immediately if he has any concerning symptoms in the interval. The patient voices understanding of current disease status and treatment options and is in agreement with the current care plan. All questions were answered. The patient knows to call the clinic with any problems, questions or concerns. We can certainly see the patient much sooner if necessary    No orders of the defined types were placed in this encounter.    I spent {CHL ONC TIME VISIT - ZOXWR:[id number]} counseling the patient face to face. The total time spent in the appointment was {CHL ONC TIME VISIT - BJYNW:[id number]}.  Wisam Siefring L Jaielle Dlouhy, PA-C 07/08/22

## 2022-07-09 ENCOUNTER — Other Ambulatory Visit: Payer: Self-pay

## 2022-07-10 ENCOUNTER — Inpatient Hospital Stay: Payer: PPO

## 2022-07-10 ENCOUNTER — Inpatient Hospital Stay: Payer: PPO | Attending: Radiation Oncology

## 2022-07-10 ENCOUNTER — Inpatient Hospital Stay (HOSPITAL_BASED_OUTPATIENT_CLINIC_OR_DEPARTMENT_OTHER): Payer: PPO | Admitting: Physician Assistant

## 2022-07-10 ENCOUNTER — Inpatient Hospital Stay: Payer: PPO | Admitting: Dietician

## 2022-07-10 ENCOUNTER — Encounter: Payer: Self-pay | Admitting: Physician Assistant

## 2022-07-10 ENCOUNTER — Other Ambulatory Visit: Payer: Self-pay

## 2022-07-10 VITALS — BP 124/74 | HR 89 | Temp 97.7°F | Resp 19 | Ht 68.0 in | Wt 159.2 lb

## 2022-07-10 VITALS — BP 120/65 | HR 89 | Temp 98.0°F | Resp 16

## 2022-07-10 DIAGNOSIS — C3432 Malignant neoplasm of lower lobe, left bronchus or lung: Secondary | ICD-10-CM

## 2022-07-10 DIAGNOSIS — Z9221 Personal history of antineoplastic chemotherapy: Secondary | ICD-10-CM | POA: Insufficient documentation

## 2022-07-10 DIAGNOSIS — Z5112 Encounter for antineoplastic immunotherapy: Secondary | ICD-10-CM | POA: Diagnosis not present

## 2022-07-10 DIAGNOSIS — Z79899 Other long term (current) drug therapy: Secondary | ICD-10-CM | POA: Diagnosis not present

## 2022-07-10 DIAGNOSIS — Z923 Personal history of irradiation: Secondary | ICD-10-CM | POA: Diagnosis not present

## 2022-07-10 DIAGNOSIS — C3412 Malignant neoplasm of upper lobe, left bronchus or lung: Secondary | ICD-10-CM | POA: Insufficient documentation

## 2022-07-10 LAB — CMP (CANCER CENTER ONLY)
ALT: 6 U/L (ref 0–44)
AST: 7 U/L — ABNORMAL LOW (ref 15–41)
Albumin: 3.6 g/dL (ref 3.5–5.0)
Alkaline Phosphatase: 100 U/L (ref 38–126)
Anion gap: 8 (ref 5–15)
BUN: 12 mg/dL (ref 8–23)
CO2: 26 mmol/L (ref 22–32)
Calcium: 8.6 mg/dL — ABNORMAL LOW (ref 8.9–10.3)
Chloride: 106 mmol/L (ref 98–111)
Creatinine: 1.2 mg/dL (ref 0.61–1.24)
GFR, Estimated: >60 mL/min (ref >60)
Glucose, Bld: 87 mg/dL (ref 70–99)
Potassium: 3.8 mmol/L (ref 3.5–5.1)
Sodium: 140 mmol/L (ref 135–145)
Total Bilirubin: 0.3 mg/dL (ref 0.3–1.2)
Total Protein: 6.9 g/dL (ref 6.5–8.1)

## 2022-07-10 LAB — CBC WITH DIFFERENTIAL (CANCER CENTER ONLY)
Abs Immature Granulocytes: 0.08 10*3/uL — ABNORMAL HIGH (ref 0.00–0.07)
Basophils Absolute: 0.1 10*3/uL (ref 0.0–0.1)
Basophils Relative: 1 %
Eosinophils Absolute: 0.1 10*3/uL (ref 0.0–0.5)
Eosinophils Relative: 2 %
HCT: 38.8 % — ABNORMAL LOW (ref 39.0–52.0)
Hemoglobin: 12.3 g/dL — ABNORMAL LOW (ref 13.0–17.0)
Immature Granulocytes: 1 %
Lymphocytes Relative: 10 %
Lymphs Abs: 0.8 10*3/uL (ref 0.7–4.0)
MCH: 27.8 pg (ref 26.0–34.0)
MCHC: 31.7 g/dL (ref 30.0–36.0)
MCV: 87.8 fL (ref 80.0–100.0)
Monocytes Absolute: 0.8 10*3/uL (ref 0.1–1.0)
Monocytes Relative: 10 %
Neutro Abs: 6.1 10*3/uL (ref 1.7–7.7)
Neutrophils Relative %: 76 %
Platelet Count: 378 10*3/uL (ref 150–400)
RBC: 4.42 MIL/uL (ref 4.22–5.81)
RDW: 14 % (ref 11.5–15.5)
WBC Count: 8 10*3/uL (ref 4.0–10.5)
nRBC: 0 % (ref 0.0–0.2)

## 2022-07-10 LAB — TSH: TSH: 2.881 u[IU]/mL (ref 0.350–4.500)

## 2022-07-10 MED ORDER — SODIUM CHLORIDE 0.9 % IV SOLN: Freq: Once | INTRAVENOUS | Status: AC

## 2022-07-10 MED ORDER — SODIUM CHLORIDE 0.9 % IV SOLN
1500.0000 mg | Freq: Once | INTRAVENOUS | Status: AC
  Administered 2022-07-10: 1500 mg via INTRAVENOUS
  Filled 2022-07-10: qty 30

## 2022-07-10 NOTE — Progress Notes (Signed)
Patient identified on MST (malnutrition screening tool report) for weight loss and poor appetite.   Met with patient during infusion. Patient reports appetite has improved. He has been eating "great" over the last few weeks and gained 10 lbs since last visit. Patient recalls 3 meals plus snacks. He denies nutrition impact symptoms.   No nutrition intervention warranted at this time. Contact information provided. Patient encouraged to call with nutrition questions/concerns.

## 2022-07-10 NOTE — Patient Instructions (Signed)
[facility]  Discharge Instructions: Thank you for choosing [facility] to provide your oncology and hematology care.   If you have a lab appointment with the Cancer Center, please go directly to the Cancer Center and check in at the registration area.   Wear comfortable clothing and clothing appropriate for easy access to any Portacath or PICC line.   We strive to give you quality time with your provider. You may need to reschedule your appointment if you arrive late (15 or more minutes).  Arriving late affects you and other patients whose appointments are after yours.  Also, if you miss three or more appointments without notifying the office, you may be dismissed from the clinic at the provider's discretion.      For prescription refill requests, have your pharmacy contact our office and allow 72 hours for refills to be completed.    Today you received the following chemotherapy and/or immunotherapy agents; Imfinzi      To help prevent nausea and vomiting after your treatment, we encourage you to take your nausea medication as directed.  BELOW ARE SYMPTOMS THAT SHOULD BE REPORTED IMMEDIATELY: *FEVER GREATER THAN 100.4 F (38 C) OR HIGHER *CHILLS OR SWEATING *NAUSEA AND VOMITING THAT IS NOT CONTROLLED WITH YOUR NAUSEA MEDICATION *UNUSUAL SHORTNESS OF BREATH *UNUSUAL BRUISING OR BLEEDING *URINARY PROBLEMS (pain or burning when urinating, or frequent urination) *BOWEL PROBLEMS (unusual diarrhea, constipation, pain near the anus) TENDERNESS IN MOUTH AND THROAT WITH OR WITHOUT PRESENCE OF ULCERS (sore throat, sores in mouth, or a toothache) UNUSUAL RASH, SWELLING OR PAIN  UNUSUAL VAGINAL DISCHARGE OR ITCHING   Items with * indicate a potential emergency and should be followed up as soon as possible or go to the Emergency Department if any problems should occur.  Please show the CHEMOTHERAPY ALERT CARD or IMMUNOTHERAPY ALERT CARD at check-in to  the Emergency Department and triage nurse.  Should you have questions after your visit or need to cancel or reschedule your appointment, please contact  [facility]  Dept: [phone]  and follow the prompts.  Office hours are 8:00 a.m. to 4:30 p.m. Monday - Friday. Please note that voicemails left after 4:00 p.m. may not be returned until the following business day.  We are closed weekends and major holidays. You have access to a nurse at all times for urgent questions. Please call the main number to the clinic Dept: [phone] and follow the prompts.   For any non-urgent questions, you may also contact your provider using MyChart. We now offer e-Visits for anyone 18 and older to request care online for non-urgent symptoms. For details visit [email or web address].   Also download the MyChart app! Go to the app store, search "MyChart", open the app, select , and log in with your MyChart username and password.  Masks are optional in the cancer centers. If you would like for your care team to wear a mask while they are taking care of you, please let them know. You may have one support person who is at least 72 years old accompany you for your appointments. 

## 2022-07-11 ENCOUNTER — Other Ambulatory Visit: Payer: Self-pay

## 2022-07-12 ENCOUNTER — Telehealth: Payer: Self-pay | Admitting: Internal Medicine

## 2022-07-12 NOTE — Telephone Encounter (Signed)
Called patient regarding upcoming scheduled appointments, patient is notified.

## 2022-07-24 ENCOUNTER — Other Ambulatory Visit: Payer: Self-pay

## 2022-07-25 ENCOUNTER — Ambulatory Visit (INDEPENDENT_AMBULATORY_CARE_PROVIDER_SITE_OTHER): Payer: PPO | Admitting: Pulmonary Disease

## 2022-07-25 ENCOUNTER — Encounter: Payer: Self-pay | Admitting: Pulmonary Disease

## 2022-07-25 VITALS — BP 120/60 | HR 96 | Ht 68.0 in | Wt 159.2 lb

## 2022-07-25 DIAGNOSIS — Z5112 Encounter for antineoplastic immunotherapy: Secondary | ICD-10-CM

## 2022-07-25 DIAGNOSIS — J984 Other disorders of lung: Secondary | ICD-10-CM | POA: Diagnosis not present

## 2022-07-25 DIAGNOSIS — Z5111 Encounter for antineoplastic chemotherapy: Secondary | ICD-10-CM

## 2022-07-25 DIAGNOSIS — C3432 Malignant neoplasm of lower lobe, left bronchus or lung: Secondary | ICD-10-CM

## 2022-07-25 NOTE — Patient Instructions (Signed)
Thank you for visiting Dr. Valeta Harms at [facility]. Today we recommend the following:  Dr. Julien Nordmann will follow up on the effusion.  If it gets worse I can always arrange for thoracentesis.  Keep using the albuterol as needed.   Return in about 1 year (around 07/26/2023), or if symptoms worsen or fail to improve.    Please do your part to reduce the spread of COVID-19.

## 2022-07-25 NOTE — Progress Notes (Signed)
Synopsis: Referred in this for Jan 2023 By Shirline Frees, MD  Subjective:   PATIENT ID: Peter Diaz GENDER: male DOB: 1951-01-06, MRN: [id number]  Chief Complaint  Patient presents with   Follow-up    Follow-up    This is a 71 year old gentleman, past medical history of hypertension, GERD, gout, longstanding history of tobacco abuse.  Patient presents for follow-up from pneumonia in October he had a left lobar pneumonia treated with Augmentin he had opacity within the chest did not resolve.  Ultimately underwent a CT scan of the chest.  Patient had a CT scan of the chest on December 14, 2021.  CT chest revealed a 7 x 5.3 necrotic left upper lobe mass with associated subcarinal adenopathy concerning for a advanced age bronchogenic carcinoma.  Patient was referred to pulmonary to discuss tissue sampling.  Patient denies hemoptysis.  He has been smoking since he was a teenager.  OV 07/25/2022: Here today for follow-up.  Patient was diagnosed with squamous cell carcinoma of the lung.Last office visit with medical oncology on 07/10/2022: Office note reviewed.  Completed course of carboplatinum plus paclitaxel and consolidation immunotherapy with Imfinzi.  Ultimately diagnosed with a stage IIIb T4, N2, M0 non-small cell lung cancer squamous cell carcinoma.  Here today overall doing well.  He does have a recent CT with small amount of loculated fluid on the lower left side.  Office note from medical oncology reviewed.    Past Medical History:  Diagnosis Date   GERD (gastroesophageal reflux disease)    Gout    History of radiation therapy    Left lung- (01/28/22-03/08/22)- Dr. Gery Pray   HTN (hypertension)      Family History  Problem Relation Age of Onset   Stroke Mother    Stroke Father    Cancer Sister      Past Surgical History:  Procedure Laterality Date   BIOPSY  01/11/2022   Procedure: BIOPSY;  Surgeon: Garner Nash, DO;  Location: [facility];  Service: Pulmonary;;    BRAIN SURGERY     BRONCHIAL BRUSHINGS  01/11/2022   Procedure: BRONCHIAL BRUSHINGS;  Surgeon: Garner Nash, DO;  Location: [facility];  Service: Pulmonary;;   BRONCHIAL NEEDLE ASPIRATION BIOPSY  01/11/2022   Procedure: BRONCHIAL NEEDLE ASPIRATION BIOPSIES;  Surgeon: Garner Nash, DO;  Location: [facility];  Service: Pulmonary;;   VIDEO BRONCHOSCOPY WITH ENDOBRONCHIAL ULTRASOUND Left 01/11/2022   Procedure: VIDEO BRONCHOSCOPY WITH ENDOBRONCHIAL ULTRASOUND;  Surgeon: Garner Nash, DO;  Location: [facility];  Service: Pulmonary;  Laterality: Left;    Social History   Socioeconomic History   Marital status: Married    Spouse name: Not on file   Number of children: Not on file   Years of education: Not on file   Highest education level: Not on file  Occupational History   Not on file  Tobacco Use   Smoking status: Former    Packs/day: 0.80    Types: Cigarettes    Quit date: 12/09/2021    Years since quitting: 0.6   Smokeless tobacco: Never   Tobacco comments:    Quit. 07/25/2022 [facility]  Vaping Use   Vaping Use: Never used  Substance and Sexual Activity   Alcohol use: Yes   Drug use: Never   Sexual activity: Not on file  Other Topics Concern   Not on file  Social History Narrative   Not on file   Social Determinants of Health   Financial Resource Strain: Not on  file  Food Insecurity: Not on file  Transportation Needs: Not on file  Physical Activity: Not on file  Stress: Not on file  Social Connections: Not on file  Intimate Partner Violence: Not on file     No Known Allergies   Outpatient Medications Prior to Visit  Medication Sig Dispense Refill   albuterol (VENTOLIN HFA) 108 (90 Base) MCG/ACT inhaler Inhale 2 puffs into the lungs every 6 (six) hours as needed for wheezing or shortness of breath. 8 g 6   amLODipine (NORVASC) 10 MG tablet Take 10 mg by mouth daily.     esomeprazole (NEXIUM) 20 MG capsule Take 20 mg by mouth daily at 12 noon.     ibuprofen  (ADVIL) 200 MG tablet Take 200 mg by mouth as needed.     mirtazapine (REMERON) 7.5 MG tablet Take 1 tablet (7.5 mg total) by mouth at bedtime. 30 tablet 2   tamsulosin (FLOMAX) 0.4 MG CAPS capsule Take 0.4 mg by mouth at bedtime.     prochlorperazine (COMPAZINE) 10 MG tablet Take 1 tablet (10 mg total) by mouth every 6 (six) hours as needed for nausea or vomiting. (Patient not taking: Reported on 07/10/2022) 30 tablet 0   sucralfate (CARAFATE) 1 g tablet Take 1 tablet (1 g total) by mouth 4 (four) times daily -  with meals and at bedtime. (Patient not taking: Reported on 07/10/2022) 120 tablet 1   No facility-administered medications prior to visit.    Review of Systems  Constitutional:  Negative for chills, fever, malaise/fatigue and weight loss.  HENT:  Negative for hearing loss, sore throat and tinnitus.   Eyes:  Negative for blurred vision and double vision.  Respiratory:  Positive for cough and shortness of breath. Negative for hemoptysis, sputum production, wheezing and stridor.   Cardiovascular:  Negative for chest pain, palpitations, orthopnea, leg swelling and PND.  Gastrointestinal:  Negative for abdominal pain, constipation, diarrhea, heartburn, nausea and vomiting.  Genitourinary:  Negative for dysuria, hematuria and urgency.  Musculoskeletal:  Negative for joint pain and myalgias.  Skin:  Negative for itching and rash.  Neurological:  Negative for dizziness, tingling, weakness and headaches.  Endo/Heme/Allergies:  Negative for environmental allergies. Does not bruise/bleed easily.  Psychiatric/Behavioral:  Negative for depression. The patient is not nervous/anxious and does not have insomnia.   All other systems reviewed and are negative.    Objective:  Physical Exam Vitals reviewed.  Constitutional:      General: He is not in acute distress.    Appearance: He is well-developed.  HENT:     Head: Normocephalic and atraumatic.  Eyes:     General: No scleral icterus.     Conjunctiva/sclera: Conjunctivae normal.     Pupils: Pupils are equal, round, and reactive to light.  Neck:     Vascular: No JVD.     Trachea: No tracheal deviation.  Cardiovascular:     Rate and Rhythm: Normal rate and regular rhythm.     Heart sounds: No murmur heard. Pulmonary:     Effort: Pulmonary effort is normal. No tachypnea, accessory muscle usage or respiratory distress.     Breath sounds: No stridor. Rales present. No wheezing or rhonchi.     Comments: Diminished breath sounds in the left base compared to the right Abdominal:     General: There is no distension.     Palpations: Abdomen is soft.     Tenderness: There is no abdominal tenderness.  Musculoskeletal:  General: No tenderness.     Cervical back: Neck supple.  Lymphadenopathy:     Cervical: No cervical adenopathy.  Skin:    General: Skin is warm and dry.     Capillary Refill: Capillary refill takes less than 2 seconds.     Findings: No rash.  Neurological:     Mental Status: He is alert and oriented to person, place, and time.  Psychiatric:        Behavior: Behavior normal.      Vitals:   07/25/22 1209  BP: 120/60  Pulse: 96  SpO2: 100%  Weight: 159 lb 3.2 oz (72.2 kg)  Height: 5\' 8"  (1.727 m)    100% on RA BMI Readings from Last 3 Encounters:  07/25/22 24.21 kg/m  07/10/22 24.21 kg/m  06/12/22 22.76 kg/m   Wt Readings from Last 3 Encounters:  07/25/22 159 lb 3.2 oz (72.2 kg)  07/10/22 159 lb 3.2 oz (72.2 kg)  06/12/22 149 lb 11.2 oz (67.9 kg)     CBC    Component Value Date/Time   WBC 8.0 07/10/2022 0728   WBC 11.1 (H) 01/11/2022 0551   RBC 4.42 07/10/2022 0728   HGB 12.3 (L) 07/10/2022 0728   HCT 38.8 (L) 07/10/2022 0728   PLT 378 07/10/2022 0728   MCV 87.8 07/10/2022 0728   MCH 27.8 07/10/2022 0728   MCHC 31.7 07/10/2022 0728   RDW 14.0 07/10/2022 0728   LYMPHSABS 0.8 07/10/2022 0728   MONOABS 0.8 07/10/2022 0728   EOSABS 0.1 07/10/2022 0728   BASOSABS 0.1  07/10/2022 0728    Chest Imaging: CT scan of the chest, 12/14/2021: Cavitary mass within the left hilum, associated subcarinal adenopathy concerning for advanced age bronchogenic carcinoma. The patient's images have been independently reviewed by me.    CT chestJuly 28, 2023: Small amount of left-sided pleural effusion area of consolidation soft tissue density within the left hilum.  No dramatic change.  In comparison to previous imaging.  Transfer text. The patient's images have been independently reviewed by me.    Pulmonary Functions Testing Results:     No data to display          FeNO:   Pathology:   Echocardiogram:   Heart Catheterization:     Assessment & Plan:     ICD-10-CM   1. Squamous cell carcinoma of bronchus in left lower lobe (HCC)  C34.32     2. Cavitating mass of lung  J98.4     3. Encounter for antineoplastic immunotherapy  Z51.12     4. Encounter for antineoplastic chemotherapy  Z51.11       Discussion:  This is a 71 year old gentleman past medical history of stage IIIb squamous cell carcinoma non-small cell status post radiation chemotherapy and immune therapy.  Plan: Continue as needed albuterol for COPD management. He has a small effusion on the left side we will continue to watch. Dr. Earlie Server will call us and let us know if it enlarges and they feel like it needs to be tapped we are happy to offer that service for thoracentesis. Patient to follow-up with [place] as needed or within the next year.   Current Outpatient Medications:    albuterol (VENTOLIN HFA) 108 (90 Base) MCG/ACT inhaler, Inhale 2 puffs into the lungs every 6 (six) hours as needed for wheezing or shortness of breath., Disp: 8 g, Rfl: 6   amLODipine (NORVASC) 10 MG tablet, Take 10 mg by mouth daily., Disp: , Rfl:    esomeprazole (  NEXIUM) 20 MG capsule, Take 20 mg by mouth daily at 12 noon., Disp: , Rfl:    ibuprofen (ADVIL) 200 MG tablet, Take 200 mg by mouth as needed., Disp: ,  Rfl:    mirtazapine (REMERON) 7.5 MG tablet, Take 1 tablet (7.5 mg total) by mouth at bedtime., Disp: 30 tablet, Rfl: 2   tamsulosin (FLOMAX) 0.4 MG CAPS capsule, Take 0.4 mg by mouth at bedtime., Disp: , Rfl:    Garner Nash, DO  Pulmonary Critical Care 07/25/2022 12:32 PM

## 2022-07-27 ENCOUNTER — Other Ambulatory Visit: Payer: Self-pay

## 2022-08-07 ENCOUNTER — Encounter: Payer: Self-pay | Admitting: Internal Medicine

## 2022-08-07 ENCOUNTER — Other Ambulatory Visit: Payer: Self-pay

## 2022-08-07 ENCOUNTER — Inpatient Hospital Stay (HOSPITAL_BASED_OUTPATIENT_CLINIC_OR_DEPARTMENT_OTHER): Payer: PPO | Admitting: Internal Medicine

## 2022-08-07 ENCOUNTER — Inpatient Hospital Stay: Payer: PPO

## 2022-08-07 VITALS — BP 125/74 | HR 96 | Temp 97.6°F | Resp 16 | Wt 162.7 lb

## 2022-08-07 VITALS — BP 115/65 | HR 76 | Temp 97.6°F | Resp 16

## 2022-08-07 DIAGNOSIS — C3432 Malignant neoplasm of lower lobe, left bronchus or lung: Secondary | ICD-10-CM | POA: Diagnosis not present

## 2022-08-07 DIAGNOSIS — Z5112 Encounter for antineoplastic immunotherapy: Secondary | ICD-10-CM | POA: Diagnosis not present

## 2022-08-07 LAB — CBC WITH DIFFERENTIAL (CANCER CENTER ONLY)
Abs Immature Granulocytes: 0.03 10*3/uL (ref 0.00–0.07)
Basophils Absolute: 0.1 10*3/uL (ref 0.0–0.1)
Basophils Relative: 1 %
Eosinophils Absolute: 0.2 10*3/uL (ref 0.0–0.5)
Eosinophils Relative: 3 %
HCT: 40.4 % (ref 39.0–52.0)
Hemoglobin: 12.9 g/dL — ABNORMAL LOW (ref 13.0–17.0)
Immature Granulocytes: 0 %
Lymphocytes Relative: 10 %
Lymphs Abs: 0.7 10*3/uL (ref 0.7–4.0)
MCH: 27.2 pg (ref 26.0–34.0)
MCHC: 31.9 g/dL (ref 30.0–36.0)
MCV: 85.1 fL (ref 80.0–100.0)
Monocytes Absolute: 0.7 10*3/uL (ref 0.1–1.0)
Monocytes Relative: 10 %
Neutro Abs: 5.6 10*3/uL (ref 1.7–7.7)
Neutrophils Relative %: 76 %
Platelet Count: 310 10*3/uL (ref 150–400)
RBC: 4.75 MIL/uL (ref 4.22–5.81)
RDW: 14.6 % (ref 11.5–15.5)
WBC Count: 7.3 10*3/uL (ref 4.0–10.5)
nRBC: 0 % (ref 0.0–0.2)

## 2022-08-07 LAB — CMP (CANCER CENTER ONLY)
ALT: 6 U/L (ref 0–44)
AST: 8 U/L — ABNORMAL LOW (ref 15–41)
Albumin: 3.5 g/dL (ref 3.5–5.0)
Alkaline Phosphatase: 101 U/L (ref 38–126)
Anion gap: 5 (ref 5–15)
BUN: 10 mg/dL (ref 8–23)
CO2: 30 mmol/L (ref 22–32)
Calcium: 8.8 mg/dL — ABNORMAL LOW (ref 8.9–10.3)
Chloride: 106 mmol/L (ref 98–111)
Creatinine: 1.07 mg/dL (ref 0.61–1.24)
GFR, Estimated: >60 mL/min (ref >60)
Glucose, Bld: 103 mg/dL — ABNORMAL HIGH (ref 70–99)
Potassium: 3.4 mmol/L — ABNORMAL LOW (ref 3.5–5.1)
Sodium: 141 mmol/L (ref 135–145)
Total Bilirubin: 0.4 mg/dL (ref 0.3–1.2)
Total Protein: 6.6 g/dL (ref 6.5–8.1)

## 2022-08-07 LAB — TSH: TSH: 1.194 u[IU]/mL (ref 0.350–4.500)

## 2022-08-07 MED ORDER — SODIUM CHLORIDE 0.9 % IV SOLN: Freq: Once | INTRAVENOUS | Status: AC

## 2022-08-07 MED ORDER — SODIUM CHLORIDE 0.9 % IV SOLN
1500.0000 mg | Freq: Once | INTRAVENOUS | Status: AC
  Administered 2022-08-07: 1500 mg via INTRAVENOUS
  Filled 2022-08-07: qty 30

## 2022-08-07 NOTE — Progress Notes (Signed)
[facility] [phone]   Fax:([phone]) [phone]  OFFICE PROGRESS NOTE  Shirline Frees, MD [place] [address]  DIAGNOSIS: Stage IIIb (T4, N2, M0) non-small cell lung cancer, squamous cell carcinoma presented with large left upper lobe lung mass with left hilar and subcarinal lymphadenopathy diagnosed in February 2023.  Molecular study by [employer] DETECTED ALTERATION(S) / BIOMARKER(S) % CFDNA OR AMPLIFICATION ASSOCIATED FDA-APPROVED THERAPIES CLINICAL TRIAL AVAILABILITY [id number] 4.4% None  Yes [id number] 1.1% None  Yes  PDL1 Expression 90%  PRIOR THERAPY:A course of concurrent chemoradiation with weekly carboplatin for AUC of 2 and paclitaxel 45 Mg/M2.  Status post 6 cycles.  Last dose was given March 04, 2022 with partial response.  CURRENT THERAPY: Consolidation treatment with immunotherapy with Imfinzi 1500 Mg IV every 4 weeks.  First dose Apr 17, 2022.  Status post 4 cycles.  INTERVAL HISTORY: Peter Diaz 71 y.o. male returns to the clinic today for follow-up visit accompanied by his wife.  The patient is feeling much better today with no concerning complaints except for occasional diarrhea that could happen once a week or sometimes 3 times a week.  He denied having any current nausea, vomiting, abdominal pain or constipation.  He has no chest pain, shortness of breath, cough or hemoptysis.  He has no headache or visual changes.  He is here today for evaluation before starting cycle #5 of his treatment.   MEDICAL HISTORY: Past Medical History:  Diagnosis Date   GERD (gastroesophageal reflux disease)    Gout    History of radiation therapy    Left lung- (01/28/22-03/08/22)- Dr. Gery Pray   HTN (hypertension)     ALLERGIES:  has No Known Allergies.  MEDICATIONS:  Current Outpatient Medications  Medication Sig Dispense Refill   amLODipine (NORVASC) 10 MG tablet Take 10 mg by mouth daily.      esomeprazole (NEXIUM) 20 MG capsule Take 20 mg by mouth daily at 12 noon.     ibuprofen (ADVIL) 200 MG tablet Take 200 mg by mouth as needed.     mirtazapine (REMERON) 7.5 MG tablet Take 1 tablet (7.5 mg total) by mouth at bedtime. 30 tablet 2   tamsulosin (FLOMAX) 0.4 MG CAPS capsule Take 0.4 mg by mouth at bedtime.     albuterol (VENTOLIN HFA) 108 (90 Base) MCG/ACT inhaler Inhale 2 puffs into the lungs every 6 (six) hours as needed for wheezing or shortness of breath. (Patient not taking: Reported on 08/07/2022) 8 g 6   No current facility-administered medications for this visit.    SURGICAL HISTORY:  Past Surgical History:  Procedure Laterality Date   BIOPSY  01/11/2022   Procedure: BIOPSY;  Surgeon: Garner Nash, DO;  Location: [facility];  Service: Pulmonary;;   BRAIN SURGERY     BRONCHIAL BRUSHINGS  01/11/2022   Procedure: BRONCHIAL BRUSHINGS;  Surgeon: Garner Nash, DO;  Location: [facility];  Service: Pulmonary;;   BRONCHIAL NEEDLE ASPIRATION BIOPSY  01/11/2022   Procedure: BRONCHIAL NEEDLE ASPIRATION BIOPSIES;  Surgeon: Garner Nash, DO;  Location: [facility];  Service: Pulmonary;;   VIDEO BRONCHOSCOPY WITH ENDOBRONCHIAL ULTRASOUND Left 01/11/2022   Procedure: VIDEO BRONCHOSCOPY WITH ENDOBRONCHIAL ULTRASOUND;  Surgeon: Garner Nash, DO;  Location: [facility];  Service: Pulmonary;  Laterality: Left;    REVIEW OF SYSTEMS:  A comprehensive review of systems was negative except for: Gastrointestinal: positive for diarrhea   PHYSICAL EXAMINATION: General appearance: alert, cooperative,  and no distress Head: Normocephalic, without obvious abnormality, atraumatic Neck: no adenopathy, no JVD, supple, symmetrical, trachea midline, and thyroid not enlarged, symmetric, no tenderness/mass/nodules Lymph nodes: Cervical, supraclavicular, and axillary nodes normal. Resp: clear to auscultation bilaterally Back: symmetric, no curvature. ROM normal. No CVA tenderness. Cardio:  regular rate and rhythm, S1, S2 normal, no murmur, click, rub or gallop GI: soft, non-tender; bowel sounds normal; no masses,  no organomegaly Extremities: extremities normal, atraumatic, no cyanosis or edema  ECOG PERFORMANCE STATUS: 1 - Symptomatic but completely ambulatory  Blood pressure 125/74, pulse 96, temperature 97.6 F (36.4 C), temperature source Oral, resp. rate 16, weight 162 lb 11.2 oz (73.8 kg), SpO2 98 %.  LABORATORY DATA: Lab Results  Component Value Date   WBC 7.3 08/07/2022   HGB 12.9 (L) 08/07/2022   HCT 40.4 08/07/2022   MCV 85.1 08/07/2022   PLT 310 08/07/2022      Chemistry      Component Value Date/Time   NA 140 07/10/2022 0728   K 3.8 07/10/2022 0728   CL 106 07/10/2022 0728   CO2 26 07/10/2022 0728   BUN 12 07/10/2022 0728   CREATININE 1.20 07/10/2022 0728      Component Value Date/Time   CALCIUM 8.6 (L) 07/10/2022 0728   ALKPHOS 100 07/10/2022 0728   AST 7 (L) 07/10/2022 0728   ALT 6 07/10/2022 0728   BILITOT 0.3 07/10/2022 0728       RADIOGRAPHIC STUDIES: No results found.  ASSESSMENT AND PLAN: This is a very pleasant 71 years old white male diagnosed with stage IIIb (T4, N2, M0) non-small cell lung cancer, squamous cell carcinoma presented with large left upper lobe lung mass in addition to left hilar and subcarinal lymphadenopathy diagnosed in February 2023.  The patient has no actionable mutation and PD-L1 expression is 90%. He had MRI of the brain performed recently that showed no concerning findings for disease metastasis to the brain. The patient completed a course of concurrent chemoradiation with weekly carboplatin for AUC of 2 and paclitaxel 45 Mg/M2 status post 6 week of treatment.  The last dose of his treatment was given on March 04, 2022 with partial response.  He tolerated this treatment well except for radiation-induced esophagitis with odynophagia. He is currently undergoing consolidation treatment with Imfinzi 1500 Mg IV every  4 weeks, status post 4 cycles. The patient has been tolerating this treatment well with no concerning adverse effect except for occasional episodes of diarrhea. I recommended for him to proceed with cycle #5 today as planned. I will see him back for follow-up visit in 4 weeks for evaluation before the next cycle of his treatment. The patient was advised to call immediately if he has any other concerning symptoms in the interval.  The patient voices understanding of current disease status and treatment options and is in agreement with the current care plan.  All questions were answered. The patient knows to call the clinic with any problems, questions or concerns. We can certainly see the patient much sooner if necessary.  The total time spent in the appointment was 20 minutes.  Disclaimer: This note was dictated with voice recognition software. Similar sounding words can inadvertently be transcribed and may not be corrected upon review.

## 2022-08-20 ENCOUNTER — Other Ambulatory Visit: Payer: Self-pay

## 2022-08-29 ENCOUNTER — Other Ambulatory Visit: Payer: Self-pay

## 2022-09-03 NOTE — Progress Notes (Unsigned)
[place] OFFICE PROGRESS NOTE  Shirline Frees, MD [place] [address]  DIAGNOSIS: Stage IIIb (T4, N2, M0) non-small cell lung cancer, squamous cell carcinoma presented with large left upper lobe lung mass with left hilar and subcarinal lymphadenopathy diagnosed in February 2023.   Molecular study by [employer] 360 DETECTED ALTERATION(S) / BIOMARKER(S)      % CFDNA OR AMPLIFICATION        ASSOCIATED FDA-APPROVED THERAPIES         CLINICAL TRIAL AVAILABILITY [id number] 4.4% None     Yes [id number] 1.1% None     Yes   PDL1 Expression 90%  PRIOR THERAPY:  A course of concurrent chemoradiation with weekly carboplatin for AUC of 2 and paclitaxel 45 Mg/M2.  Status post 6 cycles.  Last dose was given March 04, 2022 with partial response.  CURRENT THERAPY: Consolidation treatment with immunotherapy with Imfinzi 1500 Mg IV every 4 weeks.  First dose Apr 17, 2022.  Status post 5 cycles.  INTERVAL HISTORY: JR MILLIRON 71 y.o. male returns to the clinic today for a follow-up visit accompanied by his wife. The patient is feeling fairly well today without any concerning complaints.  The patient is currently undergoing immunotherapy with Imfinzi.  He is tolerating this well without any concerning adverse side effects except his wife has mentioned he tends to have diarrhea. This has been occurring for about 2 years. He has now been taking imodium in the morning, but he may have 3-4 loose stools per day. Denies abdominal pain or blood in the stool. He may take another imodium after a loose stool but sometimes not.  Otherwise, de denies any fever, chills, or night sweats.  He previously was having a decreased appetite and weight loss which he is on a low dose of remeron. He is needing a refill. He lost about 6 lbs since last being seen. He reports stable mild dyspnea on exertion when going up steps. He denies any cough unless he lays down at night, which is  also similar to his baseline.  He follows with Dr. Valeta Harms for his history of effusion for which Dr. Harlow Ohms mentioned that he can perform a thoracentesis if needed in the future for worsening effusions.  Patient denies any nausea, vomiting, or constipation. Denies any headache or visual changes.  Denies any rashes or skin changes.  The patient is here today for evaluation repeat blood work before undergoing cycle #6.   MEDICAL HISTORY: Past Medical History:  Diagnosis Date   GERD (gastroesophageal reflux disease)    Gout    History of radiation therapy    Left lung- (01/28/22-03/08/22)- Dr. Gery Pray   HTN (hypertension)     ALLERGIES:  has No Known Allergies.  MEDICATIONS:  Current Outpatient Medications  Medication Sig Dispense Refill   amLODipine (NORVASC) 10 MG tablet Take 10 mg by mouth daily.     esomeprazole (NEXIUM) 20 MG capsule Take 20 mg by mouth daily at 12 noon.     ibuprofen (ADVIL) 200 MG tablet Take 200 mg by mouth as needed.     tamsulosin (FLOMAX) 0.4 MG CAPS capsule Take 0.4 mg by mouth at bedtime.     albuterol (VENTOLIN HFA) 108 (90 Base) MCG/ACT inhaler Inhale 2 puffs into the lungs every 6 (six) hours as needed for wheezing or shortness of breath. (Patient not taking: Reported on 08/07/2022) 8 g 6   mirtazapine (REMERON) 15 MG tablet Take 1 tablet (15  mg total) by mouth at bedtime. 30 tablet 2   No current facility-administered medications for this visit.    SURGICAL HISTORY:  Past Surgical History:  Procedure Laterality Date   BIOPSY  01/11/2022   Procedure: BIOPSY;  Surgeon: Garner Nash, DO;  Location: [facility];  Service: Pulmonary;;   BRAIN SURGERY     BRONCHIAL BRUSHINGS  01/11/2022   Procedure: BRONCHIAL BRUSHINGS;  Surgeon: Garner Nash, DO;  Location: [facility];  Service: Pulmonary;;   BRONCHIAL NEEDLE ASPIRATION BIOPSY  01/11/2022   Procedure: BRONCHIAL NEEDLE ASPIRATION BIOPSIES;  Surgeon: Garner Nash, DO;  Location: [facility];   Service: Pulmonary;;   VIDEO BRONCHOSCOPY WITH ENDOBRONCHIAL ULTRASOUND Left 01/11/2022   Procedure: VIDEO BRONCHOSCOPY WITH ENDOBRONCHIAL ULTRASOUND;  Surgeon: Garner Nash, DO;  Location: [facility];  Service: Pulmonary;  Laterality: Left;    REVIEW OF SYSTEMS:   Review of Systems  Constitutional: Negative for appetite change, chills, fatigue, fever and unexpected weight change.  HENT: Negative for mouth sores, nosebleeds, sore throat and trouble swallowing.   Eyes: Negative for eye problems and icterus.  Respiratory: Positive for mild dyspnea on exertion and cough if laying down. Negative for hemoptysis and wheezing.   Cardiovascular: Negative for chest pain and leg swelling.  Gastrointestinal: Positive for intermittent diarrhea. Negative for abdominal pain, constipation, nausea and vomiting.  Genitourinary: Negative for bladder incontinence, difficulty urinating, dysuria, frequency and hematuria.   Musculoskeletal: Negative for back pain, gait problem, neck pain and neck stiffness.  Skin: Negative for itching and rash.  Neurological: Negative for dizziness, extremity weakness, gait problem, headaches, light-headedness and seizures.  Hematological: Negative for adenopathy. Does not bruise/bleed easily.  Psychiatric/Behavioral: Negative for confusion, depression and sleep disturbance. The patient is not nervous/anxious.      PHYSICAL EXAMINATION:  Blood pressure 136/64, pulse 94, temperature 97.9 F (36.6 C), temperature source Temporal, resp. rate 18, height 5' 8"  (1.727 m), weight 156 lb 1.6 oz (70.8 kg), SpO2 92 %.  ECOG PERFORMANCE STATUS: 1  Physical Exam  Constitutional: Oriented to person, place, and time and well-developed, well-nourished, and in no distress.  HENT:  Head: Normocephalic and atraumatic.  Mouth/Throat: Oropharynx is clear and moist. No oropharyngeal exudate.  Eyes: Conjunctivae are normal. Right eye exhibits no discharge. Left eye exhibits no discharge. No  scleral icterus.  Neck: Normal range of motion. Neck supple.  Cardiovascular: Normal rate, regular rhythm, normal heart sounds and intact distal pulses.   Pulmonary/Chest: Effort normal. No respiratory distress. No wheezes. No rales.  Abdominal: Soft. Bowel sounds are normal. Exhibits no distension and no mass. There is no tenderness.  Musculoskeletal: Normal range of motion. Exhibits no edema.  Lymphadenopathy:    No cervical adenopathy.  Neurological: Alert and oriented to person, place, and time. Exhibits normal muscle tone. Gait normal. Coordination normal.  Skin: Skin is warm and dry. No rash noted. Not diaphoretic. No erythema. No pallor.  Psychiatric: Mood, memory and judgment normal.  Vitals reviewed.    LABORATORY DATA: Lab Results  Component Value Date   WBC 7.4 09/04/2022   HGB 12.8 (L) 09/04/2022   HCT 39.9 09/04/2022   MCV 83.5 09/04/2022   PLT 357 09/04/2022      Chemistry      Component Value Date/Time   NA 139 09/04/2022 1057   K 3.3 (L) 09/04/2022 1057   CL 104 09/04/2022 1057   CO2 28 09/04/2022 1057   BUN 10 09/04/2022 1057   CREATININE 1.25 (H) 09/04/2022 1057  Component Value Date/Time   CALCIUM 8.8 (L) 09/04/2022 1057   ALKPHOS 107 09/04/2022 1057   AST 10 (L) 09/04/2022 1057   ALT 8 09/04/2022 1057   BILITOT 0.5 09/04/2022 1057       RADIOGRAPHIC STUDIES:  No results found.   ASSESSMENT/PLAN:  This is a very pleasant 71 year old Caucasian male diagnosed with stage IIIb (T4, N2, M0) non-small cell lung cancer, squamous cell carcinoma.  The patient presented with a left upper lobe mass in addition to left hilar and subcarinal lymphadenopathy.  He was diagnosed in February 2023.  The patient has no actionable mutations and his PD-L1 expression is 90%.  The patient completed a course of concurrent chemoradiation with weekly carboplatin for an AUC of 2 and paclitaxel 45 mg per metered squared.  He status post 6 cycles.  His last dose of  treatment was given on 03/04/2022.  He had a partial response to treatment.  The patient is currently undergoing consolidation immunotherapy with Imfinzi 1500 mg IV every 4 weeks.  He is status post 5 cycles.  Labs were reviewed.  Recommend that he proceed with cycle #6 today as scheduled.  I will arrange for restaging CT scan the chest prior to starting his next cycle of treatment in 4 weeks.  We will continue to monitor his pleural effusion.  The patient was advised to call us or Dr. Juline Patch office if he feels like he needs a thoracentesis.  He will continue taking Remeron for his appetite. I will increase his dose to 15 mg p.o. nightly due to the weight loss.   His potassium is slightly low, likely due to his diarrhea. He reports this has been occurring for some time now. He was given a handout on potassium rich food and instructed to increase his intake of potassium rich food. We also reviewed how to take imodium. Discussed he can take another tablet after each loose stool up until the max dose of 16 mg if needed. Advised to increase his hydration. Advised to call for evaluation if worsening diarrhea >6x per day not responsive to imodium, abdominal pain, fevers, or blood in the stool.   The patient was advised to call immediately if he has any concerning symptoms in the interval. The patient voices understanding of current disease status and treatment options and is in agreement with the current care plan. All questions were answered. The patient knows to call the clinic with any problems, questions or concerns. We can certainly see the patient much sooner if necessary        Orders Placed This Encounter  Procedures   CT Chest W Contrast    Standing Status:   Future    Standing Expiration Date:   09/04/2023    Order Specific Question:   If indicated for the ordered procedure, I authorize the administration of contrast media per Radiology protocol    Answer:   Yes    Order Specific  Question:   Preferred imaging location?    Answer:   [facility]     The total time spent in the appointment was 20-29 minutes.   Keilyn Haggard L Lechelle Wrigley, PA-C 09/04/22

## 2022-09-04 ENCOUNTER — Other Ambulatory Visit: Payer: Self-pay

## 2022-09-04 ENCOUNTER — Inpatient Hospital Stay: Payer: PPO

## 2022-09-04 ENCOUNTER — Inpatient Hospital Stay: Payer: PPO | Attending: Radiation Oncology

## 2022-09-04 ENCOUNTER — Encounter: Payer: Self-pay | Admitting: Physician Assistant

## 2022-09-04 ENCOUNTER — Inpatient Hospital Stay (HOSPITAL_BASED_OUTPATIENT_CLINIC_OR_DEPARTMENT_OTHER): Payer: PPO | Admitting: Physician Assistant

## 2022-09-04 VITALS — BP 122/79 | HR 78 | Resp 16

## 2022-09-04 VITALS — BP 136/64 | HR 94 | Temp 97.9°F | Resp 18 | Ht 68.0 in | Wt 156.1 lb

## 2022-09-04 DIAGNOSIS — R197 Diarrhea, unspecified: Secondary | ICD-10-CM | POA: Insufficient documentation

## 2022-09-04 DIAGNOSIS — Z9221 Personal history of antineoplastic chemotherapy: Secondary | ICD-10-CM | POA: Insufficient documentation

## 2022-09-04 DIAGNOSIS — Z5112 Encounter for antineoplastic immunotherapy: Secondary | ICD-10-CM | POA: Insufficient documentation

## 2022-09-04 DIAGNOSIS — R590 Localized enlarged lymph nodes: Secondary | ICD-10-CM | POA: Insufficient documentation

## 2022-09-04 DIAGNOSIS — R634 Abnormal weight loss: Secondary | ICD-10-CM | POA: Diagnosis not present

## 2022-09-04 DIAGNOSIS — C3412 Malignant neoplasm of upper lobe, left bronchus or lung: Secondary | ICD-10-CM | POA: Diagnosis not present

## 2022-09-04 DIAGNOSIS — C3432 Malignant neoplasm of lower lobe, left bronchus or lung: Secondary | ICD-10-CM

## 2022-09-04 DIAGNOSIS — R630 Anorexia: Secondary | ICD-10-CM

## 2022-09-04 DIAGNOSIS — Z923 Personal history of irradiation: Secondary | ICD-10-CM | POA: Diagnosis not present

## 2022-09-04 DIAGNOSIS — J90 Pleural effusion, not elsewhere classified: Secondary | ICD-10-CM | POA: Diagnosis not present

## 2022-09-04 DIAGNOSIS — Z79899 Other long term (current) drug therapy: Secondary | ICD-10-CM | POA: Diagnosis not present

## 2022-09-04 LAB — CBC WITH DIFFERENTIAL (CANCER CENTER ONLY)
Abs Immature Granulocytes: 0.05 10*3/uL (ref 0.00–0.07)
Basophils Absolute: 0 10*3/uL (ref 0.0–0.1)
Basophils Relative: 1 %
Eosinophils Absolute: 0.2 10*3/uL (ref 0.0–0.5)
Eosinophils Relative: 2 %
HCT: 39.9 % (ref 39.0–52.0)
Hemoglobin: 12.8 g/dL — ABNORMAL LOW (ref 13.0–17.0)
Immature Granulocytes: 1 %
Lymphocytes Relative: 10 %
Lymphs Abs: 0.7 10*3/uL (ref 0.7–4.0)
MCH: 26.8 pg (ref 26.0–34.0)
MCHC: 32.1 g/dL (ref 30.0–36.0)
MCV: 83.5 fL (ref 80.0–100.0)
Monocytes Absolute: 0.9 10*3/uL (ref 0.1–1.0)
Monocytes Relative: 12 %
Neutro Abs: 5.6 10*3/uL (ref 1.7–7.7)
Neutrophils Relative %: 74 %
Platelet Count: 357 10*3/uL (ref 150–400)
RBC: 4.78 MIL/uL (ref 4.22–5.81)
RDW: 13.7 % (ref 11.5–15.5)
WBC Count: 7.4 10*3/uL (ref 4.0–10.5)
nRBC: 0 % (ref 0.0–0.2)

## 2022-09-04 LAB — CMP (CANCER CENTER ONLY)
ALT: 8 U/L (ref 0–44)
AST: 10 U/L — ABNORMAL LOW (ref 15–41)
Albumin: 3.6 g/dL (ref 3.5–5.0)
Alkaline Phosphatase: 107 U/L (ref 38–126)
Anion gap: 7 (ref 5–15)
BUN: 10 mg/dL (ref 8–23)
CO2: 28 mmol/L (ref 22–32)
Calcium: 8.8 mg/dL — ABNORMAL LOW (ref 8.9–10.3)
Chloride: 104 mmol/L (ref 98–111)
Creatinine: 1.25 mg/dL — ABNORMAL HIGH (ref 0.61–1.24)
GFR, Estimated: >60 mL/min (ref >60)
Glucose, Bld: 101 mg/dL — ABNORMAL HIGH (ref 70–99)
Potassium: 3.3 mmol/L — ABNORMAL LOW (ref 3.5–5.1)
Sodium: 139 mmol/L (ref 135–145)
Total Bilirubin: 0.5 mg/dL (ref 0.3–1.2)
Total Protein: 7.2 g/dL (ref 6.5–8.1)

## 2022-09-04 LAB — TSH: TSH: 0.949 u[IU]/mL (ref 0.350–4.500)

## 2022-09-04 MED ORDER — MIRTAZAPINE 15 MG PO TABS: 15.0000 mg | ORAL_TABLET | Freq: Every day | ORAL | 2 refills | Status: DC

## 2022-09-04 MED ORDER — SODIUM CHLORIDE 0.9 % IV SOLN: Freq: Once | INTRAVENOUS | Status: AC

## 2022-09-04 MED ORDER — SODIUM CHLORIDE 0.9 % IV SOLN
1500.0000 mg | Freq: Once | INTRAVENOUS | Status: AC
  Administered 2022-09-04: 1500 mg via INTRAVENOUS
  Filled 2022-09-04: qty 30

## 2022-09-04 NOTE — Patient Instructions (Signed)
[facility]  Discharge Instructions: Thank you for choosing [facility] to provide your oncology and hematology care.   If you have a lab appointment with the Cancer Center, please go directly to the Cancer Center and check in at the registration area.   Wear comfortable clothing and clothing appropriate for easy access to any Portacath or PICC line.   We strive to give you quality time with your provider. You may need to reschedule your appointment if you arrive late (15 or more minutes).  Arriving late affects you and other patients whose appointments are after yours.  Also, if you miss three or more appointments without notifying the office, you may be dismissed from the clinic at the provider's discretion.      For prescription refill requests, have your pharmacy contact our office and allow 72 hours for refills to be completed.    Today you received the following chemotherapy and/or immunotherapy agents; Imfinzi      To help prevent nausea and vomiting after your treatment, we encourage you to take your nausea medication as directed.  BELOW ARE SYMPTOMS THAT SHOULD BE REPORTED IMMEDIATELY: *FEVER GREATER THAN 100.4 F (38 C) OR HIGHER *CHILLS OR SWEATING *NAUSEA AND VOMITING THAT IS NOT CONTROLLED WITH YOUR NAUSEA MEDICATION *UNUSUAL SHORTNESS OF BREATH *UNUSUAL BRUISING OR BLEEDING *URINARY PROBLEMS (pain or burning when urinating, or frequent urination) *BOWEL PROBLEMS (unusual diarrhea, constipation, pain near the anus) TENDERNESS IN MOUTH AND THROAT WITH OR WITHOUT PRESENCE OF ULCERS (sore throat, sores in mouth, or a toothache) UNUSUAL RASH, SWELLING OR PAIN  UNUSUAL VAGINAL DISCHARGE OR ITCHING   Items with * indicate a potential emergency and should be followed up as soon as possible or go to the Emergency Department if any problems should occur.  Please show the CHEMOTHERAPY ALERT CARD or IMMUNOTHERAPY ALERT CARD at check-in to  the Emergency Department and triage nurse.  Should you have questions after your visit or need to cancel or reschedule your appointment, please contact [facility]  Dept: [phone]  and follow the prompts.  Office hours are 8:00 a.m. to 4:30 p.m. Monday - Friday. Please note that voicemails left after 4:00 p.m. may not be returned until the following business day.  We are closed weekends and major holidays. You have access to a nurse at all times for urgent questions. Please call the main number to the clinic Dept: [phone] and follow the prompts.   For any non-urgent questions, you may also contact your provider using MyChart. We now offer e-Visits for anyone 18 and older to request care online for non-urgent symptoms. For details visit [email or web address].   Also download the MyChart app! Go to the app store, search "MyChart", open the app, select [facility], and log in with your MyChart username and password.  Masks are optional in the cancer centers. If you would like for your care team to wear a mask while they are taking care of you, please let them know. You may have one support person who is at least 72 years old accompany you for your appointments. 

## 2022-09-05 LAB — T4: T4, Total: 6.3 ug/dL (ref 4.5–12.0)

## 2022-09-09 ENCOUNTER — Other Ambulatory Visit: Payer: Self-pay

## 2022-09-21 ENCOUNTER — Other Ambulatory Visit: Payer: Self-pay

## 2022-09-23 ENCOUNTER — Telehealth: Payer: Self-pay | Admitting: Internal Medicine

## 2022-09-23 NOTE — Telephone Encounter (Signed)
Called patient regarding upcoming October, November and December appointments. Left a voicemail.

## 2022-09-25 ENCOUNTER — Telehealth: Payer: Self-pay

## 2022-09-25 NOTE — Telephone Encounter (Signed)
This nurse received a call from this patients wife stating that she has not received a call about scheduling CT scan. She states that an evening appointment is needed.  This nurse reached out to [facility] and scheduled scan for 10/19 at 5 pm with an arrival of 4:30pm at the main entrance of [facility] that patient is to have no food after 12:30 pm and may have liquids.  She acknowledged understanding and is in agreement with appointment time.  No further questions or concerns noted.

## 2022-09-26 ENCOUNTER — Ambulatory Visit (HOSPITAL_COMMUNITY)
Admission: RE | Admit: 2022-09-26 | Discharge: 2022-09-26 | Disposition: A | Discharge status: Home / Self Care | Payer: PPO | Source: Ambulatory Visit | Attending: Physician Assistant | Admitting: Physician Assistant

## 2022-09-26 DIAGNOSIS — J90 Pleural effusion, not elsewhere classified: Secondary | ICD-10-CM | POA: Diagnosis not present

## 2022-09-26 DIAGNOSIS — J439 Emphysema, unspecified: Secondary | ICD-10-CM | POA: Diagnosis not present

## 2022-09-26 DIAGNOSIS — C3432 Malignant neoplasm of lower lobe, left bronchus or lung: Secondary | ICD-10-CM | POA: Diagnosis not present

## 2022-09-26 MED ORDER — SODIUM CHLORIDE (PF) 0.9 % IJ SOLN
INTRAMUSCULAR | Status: AC
  Filled 2022-09-26: qty 50

## 2022-09-26 MED ORDER — IOHEXOL 300 MG/ML  SOLN
80.0000 mL | Freq: Once | INTRAMUSCULAR | Status: AC | PRN
  Administered 2022-09-26: 80 mL via INTRAVENOUS

## 2022-09-30 ENCOUNTER — Telehealth: Payer: Self-pay

## 2022-09-30 NOTE — Telephone Encounter (Signed)
This nurse received a call report from Radiology for CT scan.  This nurse verified receipt of the results.  No further questions or concerns noted.

## 2022-10-02 ENCOUNTER — Inpatient Hospital Stay: Payer: PPO | Attending: Radiation Oncology

## 2022-10-02 ENCOUNTER — Inpatient Hospital Stay (HOSPITAL_BASED_OUTPATIENT_CLINIC_OR_DEPARTMENT_OTHER): Payer: PPO | Admitting: Internal Medicine

## 2022-10-02 ENCOUNTER — Inpatient Hospital Stay: Payer: PPO

## 2022-10-02 ENCOUNTER — Encounter: Payer: Self-pay | Admitting: Internal Medicine

## 2022-10-02 VITALS — HR 88

## 2022-10-02 DIAGNOSIS — R634 Abnormal weight loss: Secondary | ICD-10-CM | POA: Diagnosis not present

## 2022-10-02 DIAGNOSIS — C3412 Malignant neoplasm of upper lobe, left bronchus or lung: Secondary | ICD-10-CM | POA: Insufficient documentation

## 2022-10-02 DIAGNOSIS — C3432 Malignant neoplasm of lower lobe, left bronchus or lung: Secondary | ICD-10-CM | POA: Diagnosis not present

## 2022-10-02 DIAGNOSIS — R590 Localized enlarged lymph nodes: Secondary | ICD-10-CM | POA: Insufficient documentation

## 2022-10-02 DIAGNOSIS — Z923 Personal history of irradiation: Secondary | ICD-10-CM | POA: Diagnosis not present

## 2022-10-02 DIAGNOSIS — Z5112 Encounter for antineoplastic immunotherapy: Secondary | ICD-10-CM | POA: Insufficient documentation

## 2022-10-02 DIAGNOSIS — Z9221 Personal history of antineoplastic chemotherapy: Secondary | ICD-10-CM | POA: Diagnosis not present

## 2022-10-02 DIAGNOSIS — Z79899 Other long term (current) drug therapy: Secondary | ICD-10-CM | POA: Diagnosis not present

## 2022-10-02 LAB — CBC WITH DIFFERENTIAL (CANCER CENTER ONLY)
Abs Immature Granulocytes: 0.04 10*3/uL (ref 0.00–0.07)
Basophils Absolute: 0.1 10*3/uL (ref 0.0–0.1)
Basophils Relative: 1 %
Eosinophils Absolute: 0.2 10*3/uL (ref 0.0–0.5)
Eosinophils Relative: 2 %
HCT: 42.1 % (ref 39.0–52.0)
Hemoglobin: 13.5 g/dL (ref 13.0–17.0)
Immature Granulocytes: 1 %
Lymphocytes Relative: 10 %
Lymphs Abs: 0.8 10*3/uL (ref 0.7–4.0)
MCH: 26.5 pg (ref 26.0–34.0)
MCHC: 32.1 g/dL (ref 30.0–36.0)
MCV: 82.7 fL (ref 80.0–100.0)
Monocytes Absolute: 0.9 10*3/uL (ref 0.1–1.0)
Monocytes Relative: 11 %
Neutro Abs: 6.4 10*3/uL (ref 1.7–7.7)
Neutrophils Relative %: 75 %
Platelet Count: 385 10*3/uL (ref 150–400)
RBC: 5.09 MIL/uL (ref 4.22–5.81)
RDW: 13.9 % (ref 11.5–15.5)
WBC Count: 8.3 10*3/uL (ref 4.0–10.5)
nRBC: 0 % (ref 0.0–0.2)

## 2022-10-02 LAB — CMP (CANCER CENTER ONLY)
ALT: 7 U/L (ref 0–44)
AST: 9 U/L — ABNORMAL LOW (ref 15–41)
Albumin: 3.7 g/dL (ref 3.5–5.0)
Alkaline Phosphatase: 123 U/L (ref 38–126)
Anion gap: 7 (ref 5–15)
BUN: 12 mg/dL (ref 8–23)
CO2: 27 mmol/L (ref 22–32)
Calcium: 9.1 mg/dL (ref 8.9–10.3)
Chloride: 103 mmol/L (ref 98–111)
Creatinine: 1.39 mg/dL — ABNORMAL HIGH (ref 0.61–1.24)
GFR, Estimated: 54 mL/min — ABNORMAL LOW (ref >60)
Glucose, Bld: 87 mg/dL (ref 70–99)
Potassium: 4.4 mmol/L (ref 3.5–5.1)
Sodium: 137 mmol/L (ref 135–145)
Total Bilirubin: 0.4 mg/dL (ref 0.3–1.2)
Total Protein: 7.3 g/dL (ref 6.5–8.1)

## 2022-10-02 LAB — TSH: TSH: 2.144 u[IU]/mL (ref 0.350–4.500)

## 2022-10-02 MED ORDER — DRONABINOL 2.5 MG PO CAPS: 2.5000 mg | ORAL_CAPSULE | Freq: Two times a day (BID) | ORAL | 0 refills | Status: DC

## 2022-10-02 MED ORDER — SODIUM CHLORIDE 0.9 % IV SOLN
1500.0000 mg | Freq: Once | INTRAVENOUS | Status: AC
  Administered 2022-10-02: 1500 mg via INTRAVENOUS
  Filled 2022-10-02: qty 30

## 2022-10-02 MED ORDER — SODIUM CHLORIDE 0.9 % IV SOLN: Freq: Once | INTRAVENOUS | Status: AC

## 2022-10-02 NOTE — Patient Instructions (Addendum)
[facility] ONCOLOGY  Discharge Instructions: Thank you for choosing [facility] to provide your oncology and hematology care.   If you have a lab appointment with the [facility], please go directly to the [facility] and check in at the registration area.   Wear comfortable clothing and clothing appropriate for easy access to any Portacath or PICC line.   We strive to give you quality time with your provider. You may need to reschedule your appointment if you arrive late (15 or more minutes).  Arriving late affects you and other patients whose appointments are after yours.  Also, if you miss three or more appointments without notifying the office, you may be dismissed from the clinic at the provider's discretion.      For prescription refill requests, have your pharmacy contact our office and allow 72 hours for refills to be completed.    Today you received the following chemotherapy and/or immunotherapy agents  Durvalumab (imfinzi)   To help prevent nausea and vomiting after your treatment, we encourage you to take your nausea medication as directed.  BELOW ARE SYMPTOMS THAT SHOULD BE REPORTED IMMEDIATELY: *FEVER GREATER THAN 100.4 F (38 C) OR HIGHER *CHILLS OR SWEATING *NAUSEA AND VOMITING THAT IS NOT CONTROLLED WITH YOUR NAUSEA MEDICATION *UNUSUAL SHORTNESS OF BREATH *UNUSUAL BRUISING OR BLEEDING *URINARY PROBLEMS (pain or burning when urinating, or frequent urination) *BOWEL PROBLEMS (unusual diarrhea, constipation, pain near the anus) TENDERNESS IN MOUTH AND THROAT WITH OR WITHOUT PRESENCE OF ULCERS (sore throat, sores in mouth, or a toothache) UNUSUAL RASH, SWELLING OR PAIN  UNUSUAL VAGINAL DISCHARGE OR ITCHING   Items with * indicate a potential emergency and should be followed up as soon as possible or go to the Emergency Department if any problems should occur.  Please show the CHEMOTHERAPY ALERT CARD or IMMUNOTHERAPY ALERT CARD at  check-in to the Emergency Department and triage nurse.  Should you have questions after your visit or need to cancel or reschedule your appointment, please contact [facility]  Dept: [phone]  and follow the prompts.  Office hours are 8:00 a.m. to 4:30 p.m. Monday - Friday. Please note that voicemails left after 4:00 p.m. may not be returned until the following business day.  We are closed weekends and major holidays. You have access to a nurse at all times for urgent questions. Please call the main number to the clinic Dept: [phone] and follow the prompts.   For any non-urgent questions, you may also contact your provider using MyChart. We now offer e-Visits for anyone 21 and older to request care online for non-urgent symptoms. For details visit [email or web address].   Also download the MyChart app! Go to the app store, search "MyChart", open the app, select [facility], and log in with your MyChart username and password.  Masks are optional in the cancer centers. If you would like for your care team to wear a mask while they are taking care of you, please let them know. You may have one support person who is at least 72 years old accompany you for your appointments.

## 2022-10-02 NOTE — Progress Notes (Signed)
[place] [phone]   [phone]  OFFICE PROGRESS NOTE  Shirline Frees, MD [place] [address]  DIAGNOSIS: Stage IIIb (T4, N2, M0) non-small cell lung cancer, squamous cell carcinoma presented with large left upper lobe lung mass with left hilar and subcarinal lymphadenopathy diagnosed in February 2023.  Molecular study by [employer] DETECTED ALTERATION(S) / BIOMARKER(S) % CFDNA OR AMPLIFICATION ASSOCIATED FDA-APPROVED THERAPIES CLINICAL TRIAL AVAILABILITY [id number] 4.4% None  Yes [id number] 1.1% None  Yes  PDL1 Expression 90%  PRIOR THERAPY:A course of concurrent chemoradiation with weekly carboplatin for AUC of 2 and paclitaxel 45 Mg/M2.  Status post 6 cycles.  Last dose was given March 04, 2022 with partial response.  CURRENT THERAPY: Consolidation treatment with immunotherapy with Imfinzi 1500 Mg IV every 4 weeks.  First dose Apr 17, 2022.  Status post 6 cycles.  INTERVAL HISTORY: Peter Diaz 71 y.o. male returns to the clinic today for follow-up visit accompanied by his daughter.  The patient is feeling fine today with no concerning complaints except for intermittent pain in the left arm with continuous weight loss.  He lost around 9 pounds since his last visit.  He has been on Remeron with no improvement.  He denied having any current chest pain, shortness of breath except with exertion with no cough or hemoptysis.  He has no nausea, vomiting, diarrhea or constipation.  He has no headache or visual changes.  He has no fever or chills.  He has been tolerating his treatment with consolidation Imfinzi fairly well.  He is here today for evaluation with repeat CT scan of the chest for restaging of his disease.   MEDICAL HISTORY: Past Medical History:  Diagnosis Date   GERD (gastroesophageal reflux disease)    Gout    History of radiation therapy    Left lung- (01/28/22-03/08/22)- Dr. Gery Pray    HTN (hypertension)     ALLERGIES:  has No Known Allergies.  MEDICATIONS:  Current Outpatient Medications  Medication Sig Dispense Refill   albuterol (VENTOLIN HFA) 108 (90 Base) MCG/ACT inhaler Inhale 2 puffs into the lungs every 6 (six) hours as needed for wheezing or shortness of breath. (Patient not taking: Reported on 08/07/2022) 8 g 6   amLODipine (NORVASC) 10 MG tablet Take 10 mg by mouth daily.     esomeprazole (NEXIUM) 20 MG capsule Take 20 mg by mouth daily at 12 noon.     ibuprofen (ADVIL) 200 MG tablet Take 200 mg by mouth as needed.     mirtazapine (REMERON) 15 MG tablet Take 1 tablet (15 mg total) by mouth at bedtime. 30 tablet 2   tamsulosin (FLOMAX) 0.4 MG CAPS capsule Take 0.4 mg by mouth at bedtime.     No current facility-administered medications for this visit.    SURGICAL HISTORY:  Past Surgical History:  Procedure Laterality Date   BIOPSY  01/11/2022   Procedure: BIOPSY;  Surgeon: Garner Nash, DO;  Location: [facility];  Service: Pulmonary;;   BRAIN SURGERY     BRONCHIAL BRUSHINGS  01/11/2022   Procedure: BRONCHIAL BRUSHINGS;  Surgeon: Garner Nash, DO;  Location: [facility];  Service: Pulmonary;;   BRONCHIAL NEEDLE ASPIRATION BIOPSY  01/11/2022   Procedure: BRONCHIAL NEEDLE ASPIRATION BIOPSIES;  Surgeon: Garner Nash, DO;  Location: [facility];  Service: Pulmonary;;   VIDEO BRONCHOSCOPY WITH ENDOBRONCHIAL ULTRASOUND Left 01/11/2022   Procedure: VIDEO BRONCHOSCOPY WITH ENDOBRONCHIAL ULTRASOUND;  Surgeon:  Garner Nash, DO;  Location: [facility];  Service: Pulmonary;  Laterality: Left;    REVIEW OF SYSTEMS:  Constitutional: positive for anorexia and weight loss Eyes: negative Ears, nose, mouth, throat, and face: negative Respiratory: positive for dyspnea on exertion Cardiovascular: negative Gastrointestinal: negative Genitourinary:negative Integument/breast: negative Hematologic/lymphatic: negative Musculoskeletal:negative Neurological:  negative Behavioral/Psych: negative Endocrine: negative Allergic/Immunologic: negative   PHYSICAL EXAMINATION: General appearance: alert, cooperative, and no distress Head: Normocephalic, without obvious abnormality, atraumatic Neck: no adenopathy, no JVD, supple, symmetrical, trachea midline, and thyroid not enlarged, symmetric, no tenderness/mass/nodules Lymph nodes: Cervical, supraclavicular, and axillary nodes normal. Resp: clear to auscultation bilaterally Back: symmetric, no curvature. ROM normal. No CVA tenderness. Cardio: regular rate and rhythm, S1, S2 normal, no murmur, click, rub or gallop GI: soft, non-tender; bowel sounds normal; no masses,  no organomegaly Extremities: extremities normal, atraumatic, no cyanosis or edema Neurologic: Alert and oriented X 3, normal strength and tone. Normal symmetric reflexes. Normal coordination and gait  ECOG PERFORMANCE STATUS: 1 - Symptomatic but completely ambulatory  Blood pressure 123/78, pulse (!) 107, temperature (!) 97.5 F (36.4 C), temperature source Oral, resp. rate 16, weight 147 lb 6 oz (66.8 kg), SpO2 (!) 88 %.  LABORATORY DATA: Lab Results  Component Value Date   WBC 8.3 10/02/2022   HGB 13.5 10/02/2022   HCT 42.1 10/02/2022   MCV 82.7 10/02/2022   PLT 385 10/02/2022      Chemistry      Component Value Date/Time   NA 139 09/04/2022 1057   K 3.3 (L) 09/04/2022 1057   CL 104 09/04/2022 1057   CO2 28 09/04/2022 1057   BUN 10 09/04/2022 1057   CREATININE 1.25 (H) 09/04/2022 1057      Component Value Date/Time   CALCIUM 8.8 (L) 09/04/2022 1057   ALKPHOS 107 09/04/2022 1057   AST 10 (L) 09/04/2022 1057   ALT 8 09/04/2022 1057   BILITOT 0.5 09/04/2022 1057       RADIOGRAPHIC STUDIES: CT Chest W Contrast  Result Date: 09/29/2022 CLINICAL DATA:  71 year old male with history of non-small cell lung cancer. Evaluate for treatment response. * Tracking Code: BO * EXAM: CT CHEST WITH CONTRAST TECHNIQUE:  Multidetector CT imaging of the chest was performed during intravenous contrast administration. RADIATION DOSE REDUCTION: This exam was performed according to the departmental dose-optimization program which includes automated exposure control, adjustment of the mA and/or kV according to patient size and/or use of iterative reconstruction technique. CONTRAST:  24m OMNIPAQUE IOHEXOL 300 MG/ML  SOLN COMPARISON:  Chest CT 07/05/2022. FINDINGS: Cardiovascular: Heart size is normal. There is no significant pericardial fluid, thickening or pericardial calcification. There is aortic atherosclerosis, as well as atherosclerosis of the great vessels of the mediastinum and the coronary arteries, including calcified atherosclerotic plaque in the left main, left anterior descending and left circumflex coronary arteries. Mediastinum/Nodes: There are multiple prominent but nonenlarged mediastinal and hilar lymph nodes, nonspecific, but similar to the prior examination. Esophagus is unremarkable in appearance. No axillary lymphadenopathy. Lungs/Pleura: Persistent areas of ground-glass attenuation, septal thickening, mass-like consolidation and architectural distortion noted in the left lung, most severe throughout the perihilar aspect of the left lung, particularly in the left upper lobe, similar to the prior study, most compatible with areas of chronic postradiation mass-like fibrosis. Moderate partially loculated left pleural effusion is chronic and similar in size and appearance to the prior study. No definite enhancing soft tissue nodularity noted in association with the left pleura. Throughout the right lung there are  patchy areas of ground-glass attenuation, interstitial prominence and areas of nodular architectural distortion. The largest areas of nodularity include several macrolobulated and spiculated appearing nodules in the medial segment of the right middle lobe (axial image 88 of series 5) measuring 1.9 x 1.3 cm,  lateral segment of the right middle lobe (axial image 85 of series 5) measuring 1.7 x 1.2 cm, and in the medial aspect of the right lower lobe (axial image 91 of series 5) measuring 1.7 x 1.4 cm. Several other smaller lesions are also noted. Diffuse bronchial wall thickening with moderate centrilobular and paraseptal emphysema. Upper Abdomen: Aortic atherosclerosis. Musculoskeletal: There are no aggressive appearing lytic or blastic lesions noted in the visualized portions of the skeleton. IMPRESSION: 1. Stable postradiation changes in the left hemithorax with no definitive findings to suggest locally recurrent disease. 2. However, there are evolving areas of interstitial prominence and aggressive appearing nodularity throughout the right lung. Differential considerations include multifocal metastatic disease, active atypical infection (including fungal etiologies), or potentially drug reaction with areas of cryptogenic organizing pneumonia (COP) in this patient on immune check point inhibitor. Close attention on follow-up study is recommended. 3. Diffuse bronchial wall thickening with moderate centrilobular and paraseptal emphysema; imaging findings suggestive of underlying COPD. 4. Aortic atherosclerosis, in addition to left main and 2 vessel coronary artery disease. Assessment for potential risk factor modification, dietary therapy or pharmacologic therapy may be warranted, if clinically indicated. These results will be called to the ordering clinician or representative by the Radiologist Assistant, and communication documented in the PACS or [employer]. Aortic Atherosclerosis (ICD10-I70.0) and Emphysema (ICD10-J43.9). Electronically Signed   By: Vinnie Langton M.D.   On: 09/29/2022 06:08    ASSESSMENT AND PLAN: This is a very pleasant 70 years old white male diagnosed with stage IIIb (T4, N2, M0) non-small cell lung cancer, squamous cell carcinoma presented with large left upper lobe lung mass in  addition to left hilar and subcarinal lymphadenopathy diagnosed in February 2023.  The patient has no actionable mutation and PD-L1 expression is 90%. He had MRI of the brain performed recently that showed no concerning findings for disease metastasis to the brain. The patient completed a course of concurrent chemoradiation with weekly carboplatin for AUC of 2 and paclitaxel 45 Mg/M2 status post 6 week of treatment.  The last dose of his treatment was given on March 04, 2022 with partial response.  He tolerated this treatment well except for radiation-induced esophagitis with odynophagia. He is currently undergoing consolidation treatment with Imfinzi 1500 Mg IV every 4 weeks, status post 6 cycles. The patient has been tolerating this treatment fairly well with no concerning adverse effects but he lost a lot of weight recently. He had repeat CT scan of the chest performed recently.  I personally and independently reviewed the scan images and discussed the result with the patient and his daughter. His scan showed no concerning finding for disease progression but there was multifocal interstitial prominence and aggressive appearing nodularity throughout the right lung suspicious for inflammatory process versus multifocal metastatic disease. I recommended for the patient to continue his current treatment with immunotherapy but we will monitor the site changes on the upcoming scan to rule out any disease metastasis. For the weight loss, he will continue his current treatment with Remeron and I will start the patient on Marinol 2.5 mg p.o. twice daily. He will come back for follow-up visit in 4 weeks for evaluation before the next cycle of his treatment. The patient was  advised to call immediately if he has any other concerning symptoms in the interval.  The patient voices understanding of current disease status and treatment options and is in agreement with the current care plan.  All questions were  answered. The patient knows to call the clinic with any problems, questions or concerns. We can certainly see the patient much sooner if necessary.  The total time spent in the appointment was 35 minutes.  Disclaimer: This note was dictated with voice recognition software. Similar sounding words can inadvertently be transcribed and may not be corrected upon review.

## 2022-10-09 ENCOUNTER — Other Ambulatory Visit: Payer: Self-pay | Admitting: Internal Medicine

## 2022-10-09 ENCOUNTER — Other Ambulatory Visit (HOSPITAL_COMMUNITY): Payer: Self-pay

## 2022-10-09 MED ORDER — DRONABINOL 2.5 MG PO CAPS
2.5000 mg | ORAL_CAPSULE | Freq: Two times a day (BID) | ORAL | 0 refills | Status: DC
  Filled 2022-10-09 – 2022-10-30 (×2): qty 60, 30d supply, fill #0

## 2022-10-10 ENCOUNTER — Other Ambulatory Visit (HOSPITAL_COMMUNITY): Payer: Self-pay

## 2022-10-10 ENCOUNTER — Telehealth: Payer: Self-pay | Admitting: Medical Oncology

## 2022-10-10 ENCOUNTER — Encounter: Payer: Self-pay | Admitting: Internal Medicine

## 2022-10-10 NOTE — Telephone Encounter (Signed)
Marinol prior auth for [employer] sent to [employer].

## 2022-10-11 ENCOUNTER — Other Ambulatory Visit (HOSPITAL_COMMUNITY): Payer: Self-pay

## 2022-10-15 ENCOUNTER — Other Ambulatory Visit: Payer: Self-pay

## 2022-10-15 DIAGNOSIS — R634 Abnormal weight loss: Secondary | ICD-10-CM | POA: Diagnosis not present

## 2022-10-15 DIAGNOSIS — K52831 Collagenous colitis: Secondary | ICD-10-CM | POA: Diagnosis not present

## 2022-10-15 DIAGNOSIS — K219 Gastro-esophageal reflux disease without esophagitis: Secondary | ICD-10-CM | POA: Diagnosis not present

## 2022-10-15 DIAGNOSIS — Z86010 Personal history of colonic polyps: Secondary | ICD-10-CM | POA: Diagnosis not present

## 2022-10-16 ENCOUNTER — Other Ambulatory Visit (HOSPITAL_COMMUNITY): Payer: Self-pay

## 2022-10-23 ENCOUNTER — Other Ambulatory Visit (HOSPITAL_COMMUNITY): Payer: Self-pay

## 2022-10-24 DIAGNOSIS — Z125 Encounter for screening for malignant neoplasm of prostate: Secondary | ICD-10-CM | POA: Diagnosis not present

## 2022-10-24 DIAGNOSIS — C3492 Malignant neoplasm of unspecified part of left bronchus or lung: Secondary | ICD-10-CM | POA: Diagnosis not present

## 2022-10-24 DIAGNOSIS — N401 Enlarged prostate with lower urinary tract symptoms: Secondary | ICD-10-CM | POA: Diagnosis not present

## 2022-10-24 DIAGNOSIS — Z0000 Encounter for general adult medical examination without abnormal findings: Secondary | ICD-10-CM | POA: Diagnosis not present

## 2022-10-24 DIAGNOSIS — I10 Essential (primary) hypertension: Secondary | ICD-10-CM | POA: Diagnosis not present

## 2022-10-24 DIAGNOSIS — K52831 Collagenous colitis: Secondary | ICD-10-CM | POA: Diagnosis not present

## 2022-10-24 DIAGNOSIS — K219 Gastro-esophageal reflux disease without esophagitis: Secondary | ICD-10-CM | POA: Diagnosis not present

## 2022-10-24 DIAGNOSIS — J439 Emphysema, unspecified: Secondary | ICD-10-CM | POA: Diagnosis not present

## 2022-10-24 DIAGNOSIS — Z23 Encounter for immunization: Secondary | ICD-10-CM | POA: Diagnosis not present

## 2022-10-25 NOTE — Progress Notes (Unsigned)
[facility] OFFICE PROGRESS NOTE  Shirline Frees, MD [place] [address]  DIAGNOSIS: Stage IIIb (T4, N2, M0) non-small cell lung cancer, squamous cell carcinoma presented with large left upper lobe lung mass with left hilar and subcarinal lymphadenopathy diagnosed in February 2023.   Molecular study by [employer] 360 DETECTED ALTERATION(S) / BIOMARKER(S)      % CFDNA OR AMPLIFICATION        ASSOCIATED FDA-APPROVED THERAPIES         CLINICAL TRIAL AVAILABILITY [id number] 4.4% None     Yes [id number] 1.1% None     Yes   PDL1 Expression 90%  PRIOR THERAPY: A course of concurrent chemoradiation with weekly carboplatin for AUC of 2 and paclitaxel 45 Mg/M2.  Status post 6 cycles.  Last dose was given March 04, 2022 with partial response.   CURRENT THERAPY: Consolidation treatment with immunotherapy with Imfinzi 1500 Mg IV every 4 weeks.  First dose Apr 17, 2022.  Status post 7 cycles.   INTERVAL HISTORY: GERAD CORNELIO 71 y.o. male returns to the clinic today for a follow-up visit accompanied by his wife.  The patient is feeling fairly well today without any concerning complaints. Currently, he is undergoing consolidation immunotherapy with Imfinzi IV every 4 weeks and he tolerates it fairly well. The patient has chronic diarrhea for 2 years, however, he states his diarrhea has improved and he is only having it every once in awhile.  He denies any fever, chills, night sweats.  He is currently taking Remeron for his decreased appetite. Marinol was prescribed but he was unable to start it due to insurance. We checked today and it is now approved. His weight is stable.  He reports he has baseline dyspnea on exertion. He reports stable cough.  Denies any hemoptysis or chest pain.  Denies any nausea, vomiting, or constipation.  Denies any rashes or skin changes.  At the patient's last appointment, he had a repeat CT scan for which we are monitoring closely.   He is here today for evaluation repeat blood work before undergoing cycle #8.    MEDICAL HISTORY: Past Medical History:  Diagnosis Date   GERD (gastroesophageal reflux disease)    Gout    History of radiation therapy    Left lung- (01/28/22-03/08/22)- Dr. Gery Pray   HTN (hypertension)     ALLERGIES:  has No Known Allergies.  MEDICATIONS:  Current Outpatient Medications  Medication Sig Dispense Refill   albuterol (VENTOLIN HFA) 108 (90 Base) MCG/ACT inhaler Inhale 2 puffs into the lungs every 6 (six) hours as needed for wheezing or shortness of breath. 8 g 6   amLODipine (NORVASC) 10 MG tablet Take 10 mg by mouth daily.     dronabinol (MARINOL) 2.5 MG capsule Take 1 capsule (2.5 mg total) by mouth 2 (two) times daily before lunch and supper. 60 capsule 0   esomeprazole (NEXIUM) 20 MG capsule Take 20 mg by mouth daily at 12 noon.     ibuprofen (ADVIL) 200 MG tablet Take 200 mg by mouth as needed.     mirtazapine (REMERON) 15 MG tablet Take 1 tablet (15 mg total) by mouth at bedtime. 30 tablet 2   tamsulosin (FLOMAX) 0.4 MG CAPS capsule Take 0.4 mg by mouth at bedtime.     No current facility-administered medications for this visit.    SURGICAL HISTORY:  Past Surgical History:  Procedure Laterality Date   BIOPSY  01/11/2022   Procedure: BIOPSY;  Surgeon: Garner Nash, DO;  Location: [facility];  Service: Pulmonary;;   BRAIN SURGERY     BRONCHIAL BRUSHINGS  01/11/2022   Procedure: BRONCHIAL BRUSHINGS;  Surgeon: Garner Nash, DO;  Location: [facility];  Service: Pulmonary;;   BRONCHIAL NEEDLE ASPIRATION BIOPSY  01/11/2022   Procedure: BRONCHIAL NEEDLE ASPIRATION BIOPSIES;  Surgeon: Garner Nash, DO;  Location: [facility];  Service: Pulmonary;;   VIDEO BRONCHOSCOPY WITH ENDOBRONCHIAL ULTRASOUND Left 01/11/2022   Procedure: VIDEO BRONCHOSCOPY WITH ENDOBRONCHIAL ULTRASOUND;  Surgeon: Garner Nash, DO;  Location: [facility];  Service: Pulmonary;  Laterality: Left;     REVIEW OF SYSTEMS:   Constitutional: Positive for decreased appetite last few months. Negative for chills, fatigue, fever and unexpected weight change.  HENT: Negative for mouth sores, nosebleeds, sore throat and trouble swallowing.   Eyes: Negative for eye problems and icterus.  Respiratory: Positive for stable dyspnea on exertion and cough. Negative for hemoptysis and wheezing.   Cardiovascular: Negative for chest pain and leg swelling.  Gastrointestinal: Positive for intermittent diarrhea (improved compared to prior). Negative for abdominal pain, constipation, nausea and vomiting.  Genitourinary: Negative for bladder incontinence, difficulty urinating, dysuria, frequency and hematuria.   Musculoskeletal: Negative for back pain, gait problem, neck pain and neck stiffness.  Skin: Negative for itching and rash.  Neurological: Negative for dizziness, extremity weakness, gait problem, headaches, light-headedness and seizures.  Hematological: Negative for adenopathy. Does not bruise/bleed easily.  Psychiatric/Behavioral: Negative for confusion, depression and sleep disturbance. The patient is not nervous/anxious.        PHYSICAL EXAMINATION:  Blood pressure 134/71, pulse 91, temperature 98.2 F (36.8 C), temperature source Oral, resp. rate 18, height _0  (1.727 m), weight 148 lb 11.2 oz (67.4 kg), SpO2 90 %.  ECOG PERFORMANCE STATUS: 1  Physical Exam  Constitutional: Oriented to person, place, and time and thin appearing male and in no distress.  HENT:  Head: Normocephalic and atraumatic.  Mouth/Throat: Oropharynx is clear and moist. No oropharyngeal exudate.  Eyes: Conjunctivae are normal. Right eye exhibits no discharge. Left eye exhibits no discharge. No scleral icterus.  Neck: Normal range of motion. Neck supple.  Cardiovascular: Normal rate, regular rhythm, normal heart sounds and intact distal pulses.   Pulmonary/Chest: Effort normal. No respiratory distress. No wheezes. No  rales.  Abdominal: Soft. Bowel sounds are normal. Exhibits no distension and no mass. There is no tenderness.  Musculoskeletal: Normal range of motion. Exhibits no edema.  Lymphadenopathy:    No cervical adenopathy.  Neurological: Alert and oriented to person, place, and time. Exhibits normal muscle tone. Gait normal. Coordination normal.  Skin: Skin is warm and dry. No rash noted. Not diaphoretic. No erythema. No pallor.  Psychiatric: Mood, memory and judgment normal.  Vitals reviewed.  LABORATORY DATA: Lab Results  Component Value Date   WBC 7.3 10/30/2022   HGB 13.3 10/30/2022   HCT 42.1 10/30/2022   MCV 82.9 10/30/2022   PLT 320 10/30/2022      Chemistry      Component Value Date/Time   NA 137 10/30/2022 1008   K 4.1 10/30/2022 1008   CL 105 10/30/2022 1008   CO2 22 10/30/2022 1008   BUN 11 10/30/2022 1008   CREATININE 1.39 (H) 10/30/2022 1008      Component Value Date/Time   CALCIUM 8.8 (L) 10/30/2022 1008   ALKPHOS 109 10/30/2022 1008   AST 13 (L) 10/30/2022 1008   ALT 10 10/30/2022 1008   BILITOT 0.6 10/30/2022 1008  RADIOGRAPHIC STUDIES:  No results found.   ASSESSMENT/PLAN:  This is a very pleasant 71 year old Caucasian male diagnosed with stage IIIb (T4, N2, M0) non-small cell lung cancer, squamous cell carcinoma.  The patient presented with a left upper lobe mass in addition to left hilar and subcarinal lymphadenopathy.  He was diagnosed in February 2023.  The patient has no actionable mutations and his PD-L1 expression is 90%.   The patient completed a course of concurrent chemoradiation with weekly carboplatin for an AUC of 2 and paclitaxel 45 mg per metered squared.  He status post 6 cycles.  His last dose of treatment was given on 03/04/2022.  He had a partial response to treatment.  The patient is currently undergoing consolidation immunotherapy with Imfinzi 1500 mg IV every 4 weeks.  He is status post 7 cycles.   At the patient's last  appointment in October 2023, the patient had a scan that showed multifocal interstitial prominence and aggressive appearing nodularity throughout the right lung suspicious for inflammatory process versus multifocal metastatic disease.  We are monitoring this area closely on subsequent imaging studies.   I reviewed with Dr. Julien Nordmann. Per Dr. Julien Nordmann, we will arrange for repeat imaging every 2 cycle of treatment.  Therefore, I will arrange for a repeat CT scan of the chest prior to his next appointment.  Labs were reviewed.  Recommend that he proceed cycle #8 today scheduled.  We will see him back for follow-up visit in 4 weeks for evaluation repeat blood work before undergoing cycle #9.  Continue taking Remeron and Marinol for his decreased appetite. We will let him know authorization was obtained for his marinol.   We will reassess his lung on upcoming CT scan. Overall, he states his breathing and cough is stable. Of course, if he develops any new or worsening symptoms, to call to be evaluated in the interval.   The patient was advised to call immediately if she has any concerning symptoms in the interval. The patient voices understanding of current disease status and treatment options and is in agreement with the current care plan. All questions were answered. The patient knows to call the clinic with any problems, questions or concerns. We can certainly see the patient much sooner if necessary       Orders Placed This Encounter  Procedures   CT Chest W Contrast    Standing Status:   Future    Standing Expiration Date:   10/30/2023    Order Specific Question:   If indicated for the ordered procedure, I authorize the administration of contrast media per Radiology protocol    Answer:   Yes    Order Specific Question:   Does the patient have a contrast media/X-ray dye allergy?    Answer:   No    Order Specific Question:   Preferred imaging location?    Answer:   [facility]       The total time spent in the appointment was 20-29 minutes.   Dalaya Suppa L Eathen Budreau, PA-C 10/30/22

## 2022-10-28 ENCOUNTER — Telehealth: Payer: Self-pay

## 2022-10-28 NOTE — Telephone Encounter (Signed)
Received appeal request form for Dronabinol 2.5mg  Capsules. Appeal filed by telephone. Requested information given by phone. Appeal determination is pending and may take up to 14 days per Representative. Patient notified.

## 2022-10-30 ENCOUNTER — Inpatient Hospital Stay: Payer: PPO | Attending: Radiation Oncology

## 2022-10-30 ENCOUNTER — Other Ambulatory Visit: Payer: Self-pay

## 2022-10-30 ENCOUNTER — Inpatient Hospital Stay (HOSPITAL_BASED_OUTPATIENT_CLINIC_OR_DEPARTMENT_OTHER): Payer: PPO | Admitting: Physician Assistant

## 2022-10-30 ENCOUNTER — Encounter: Payer: Self-pay | Admitting: Internal Medicine

## 2022-10-30 ENCOUNTER — Other Ambulatory Visit (HOSPITAL_COMMUNITY): Payer: Self-pay

## 2022-10-30 ENCOUNTER — Telehealth: Payer: Self-pay

## 2022-10-30 ENCOUNTER — Inpatient Hospital Stay: Payer: PPO

## 2022-10-30 VITALS — BP 134/71 | HR 91 | Temp 98.2°F | Resp 18 | Ht 68.0 in | Wt 148.7 lb

## 2022-10-30 DIAGNOSIS — C3432 Malignant neoplasm of lower lobe, left bronchus or lung: Secondary | ICD-10-CM | POA: Diagnosis not present

## 2022-10-30 DIAGNOSIS — Z923 Personal history of irradiation: Secondary | ICD-10-CM | POA: Diagnosis not present

## 2022-10-30 DIAGNOSIS — Z79899 Other long term (current) drug therapy: Secondary | ICD-10-CM | POA: Insufficient documentation

## 2022-10-30 DIAGNOSIS — Z5112 Encounter for antineoplastic immunotherapy: Secondary | ICD-10-CM | POA: Insufficient documentation

## 2022-10-30 DIAGNOSIS — C3412 Malignant neoplasm of upper lobe, left bronchus or lung: Secondary | ICD-10-CM | POA: Diagnosis not present

## 2022-10-30 LAB — CMP (CANCER CENTER ONLY)
ALT: 10 U/L (ref 0–44)
AST: 13 U/L — ABNORMAL LOW (ref 15–41)
Albumin: 3.6 g/dL (ref 3.5–5.0)
Alkaline Phosphatase: 109 U/L (ref 38–126)
Anion gap: 10 (ref 5–15)
BUN: 11 mg/dL (ref 8–23)
CO2: 22 mmol/L (ref 22–32)
Calcium: 8.8 mg/dL — ABNORMAL LOW (ref 8.9–10.3)
Chloride: 105 mmol/L (ref 98–111)
Creatinine: 1.39 mg/dL — ABNORMAL HIGH (ref 0.61–1.24)
GFR, Estimated: 54 mL/min — ABNORMAL LOW (ref >60)
Glucose, Bld: 97 mg/dL (ref 70–99)
Potassium: 4.1 mmol/L (ref 3.5–5.1)
Sodium: 137 mmol/L (ref 135–145)
Total Bilirubin: 0.6 mg/dL (ref 0.3–1.2)
Total Protein: 7.4 g/dL (ref 6.5–8.1)

## 2022-10-30 LAB — CBC WITH DIFFERENTIAL (CANCER CENTER ONLY)
Abs Immature Granulocytes: 0.04 10*3/uL (ref 0.00–0.07)
Basophils Absolute: 0.1 10*3/uL (ref 0.0–0.1)
Basophils Relative: 1 %
Eosinophils Absolute: 0.1 10*3/uL (ref 0.0–0.5)
Eosinophils Relative: 1 %
HCT: 42.1 % (ref 39.0–52.0)
Hemoglobin: 13.3 g/dL (ref 13.0–17.0)
Immature Granulocytes: 1 %
Lymphocytes Relative: 8 %
Lymphs Abs: 0.6 10*3/uL — ABNORMAL LOW (ref 0.7–4.0)
MCH: 26.2 pg (ref 26.0–34.0)
MCHC: 31.6 g/dL (ref 30.0–36.0)
MCV: 82.9 fL (ref 80.0–100.0)
Monocytes Absolute: 0.6 10*3/uL (ref 0.1–1.0)
Monocytes Relative: 8 %
Neutro Abs: 5.9 10*3/uL (ref 1.7–7.7)
Neutrophils Relative %: 81 %
Platelet Count: 320 10*3/uL (ref 150–400)
RBC: 5.08 MIL/uL (ref 4.22–5.81)
RDW: 15.1 % (ref 11.5–15.5)
WBC Count: 7.3 10*3/uL (ref 4.0–10.5)
nRBC: 0 % (ref 0.0–0.2)

## 2022-10-30 LAB — TSH: TSH: 0.924 u[IU]/mL (ref 0.350–4.500)

## 2022-10-30 MED ORDER — SODIUM CHLORIDE 0.9 % IV SOLN
1500.0000 mg | Freq: Once | INTRAVENOUS | Status: AC
  Administered 2022-10-30: 1500 mg via INTRAVENOUS
  Filled 2022-10-30: qty 30

## 2022-10-30 MED ORDER — SODIUM CHLORIDE 0.9 % IV SOLN: Freq: Once | INTRAVENOUS | Status: AC

## 2022-10-30 NOTE — Patient Instructions (Signed)
[facility]  Discharge Instructions: Thank you for choosing [facility] to provide your oncology and hematology care.   If you have a lab appointment with the [facility], please go directly to the [facility] and check in at the registration area.   Wear comfortable clothing and clothing appropriate for easy access to any Portacath or PICC line.   We strive to give you quality time with your provider. You may need to reschedule your appointment if you arrive late (15 or more minutes).  Arriving late affects you and other patients whose appointments are after yours.  Also, if you miss three or more appointments without notifying the office, you may be dismissed from the clinic at the provider's discretion.      For prescription refill requests, have your pharmacy contact our office and allow 72 hours for refills to be completed.    Today you received the following chemotherapy and/or immunotherapy agents: durvalumab      To help prevent nausea and vomiting after your treatment, we encourage you to take your nausea medication as directed.  BELOW ARE SYMPTOMS THAT SHOULD BE REPORTED IMMEDIATELY: *FEVER GREATER THAN 100.4 F (38 C) OR HIGHER *CHILLS OR SWEATING *NAUSEA AND VOMITING THAT IS NOT CONTROLLED WITH YOUR NAUSEA MEDICATION *UNUSUAL SHORTNESS OF BREATH *UNUSUAL BRUISING OR BLEEDING *URINARY PROBLEMS (pain or burning when urinating, or frequent urination) *BOWEL PROBLEMS (unusual diarrhea, constipation, pain near the anus) TENDERNESS IN MOUTH AND THROAT WITH OR WITHOUT PRESENCE OF ULCERS (sore throat, sores in mouth, or a toothache) UNUSUAL RASH, SWELLING OR PAIN  UNUSUAL VAGINAL DISCHARGE OR ITCHING   Items with * indicate a potential emergency and should be followed up as soon as possible or go to the Emergency Department if any problems should occur.  Please show the CHEMOTHERAPY ALERT CARD or IMMUNOTHERAPY ALERT CARD at check-in to  the Emergency Department and triage nurse.  Should you have questions after your visit or need to cancel or reschedule your appointment, please contact [facility]  Dept: [phone]  and follow the prompts.  Office hours are 8:00 a.m. to 4:30 p.m. Monday - Friday. Please note that voicemails left after 4:00 p.m. may not be returned until the following business day.  We are closed weekends and major holidays. You have access to a nurse at all times for urgent questions. Please call the main number to the clinic Dept: [phone] and follow the prompts.   For any non-urgent questions, you may also contact your provider using MyChart. We now offer e-Visits for anyone 64 and older to request care online for non-urgent symptoms. For details visit [email or web address].   Also download the MyChart app! Go to the app store, search "MyChart", open the app, select [facility], and log in with your MyChart username and password.  Masks are optional in the cancer centers. If you would like for your care team to wear a mask while they are taking care of you, please let them know. You may have one support person who is at least 72 years old accompany you for your appointments.

## 2022-10-30 NOTE — Telephone Encounter (Signed)
Patient notified of prior authorization approval for Dronabinol 2.5mg  Capsules.Medication is approved from 10/29/2022 through 04/28/2023.

## 2022-11-02 ENCOUNTER — Other Ambulatory Visit: Payer: Self-pay

## 2022-11-07 ENCOUNTER — Other Ambulatory Visit: Payer: Self-pay

## 2022-11-08 ENCOUNTER — Other Ambulatory Visit: Payer: Self-pay

## 2022-11-24 NOTE — Progress Notes (Unsigned)
[name] OFFICE PROGRESS NOTE  Shirline Frees, MD [name] [address]  DIAGNOSIS: Stage IIIb (T4, N2, M0) non-small cell lung cancer, squamous cell carcinoma presented with large left upper lobe lung mass with left hilar and subcarinal lymphadenopathy diagnosed in February 2023.   Molecular study by [employer] 360 DETECTED ALTERATION(S) / BIOMARKER(S)      % CFDNA OR AMPLIFICATION        ASSOCIATED FDA-APPROVED THERAPIES         CLINICAL TRIAL AVAILABILITY [id number] 4.4% None     Yes [id number] 1.1% None     Yes   PDL1 Expression 90%  PRIOR THERAPY:  A course of concurrent chemoradiation with weekly carboplatin for AUC of 2 and paclitaxel 45 Mg/M2.  Status post 6 cycles.  Last dose was given March 04, 2022 with partial response.    CURRENT THERAPY: Consolidation treatment with immunotherapy with Imfinzi 1500 Mg IV every 4 weeks.  First dose Apr 17, 2022.  Status post 8 cycles.   ***  INTERVAL HISTORY: Peter Diaz 71 y.o. male returns  to the clinic today for a follow-up visit accompanied by his wife.  The patient is feeling fairly well today without any concerning complaints. Currently, he is undergoing consolidation immunotherapy with Imfinzi IV every 4 weeks and he tolerates it fairly well. The patient has chronic diarrhea for 2 years, however, he states his diarrhea has improved and he is only having it every once in awhile.  He denies any fever, chills, night sweats.  He is currently taking Remeron and marinol for his decreased appetite. His weight had ***.   He reports he has baseline dyspnea on exertion. He reports stable cough.  Denies any hemoptysis or chest pain.  Denies any nausea, vomiting, or constipation.  Denies any rashes or skin changes.  The patient recently had a restaging CT scan performed to monitor the nodule seen on his last scan.  He is here today for evaluation and to review his scan results before starting cycle  #9.      MEDICAL HISTORY: Past Medical History:  Diagnosis Date   GERD (gastroesophageal reflux disease)    Gout    History of radiation therapy    Left lung- (01/28/22-03/08/22)- Dr. Gery Pray   HTN (hypertension)     ALLERGIES:  has No Known Allergies.  MEDICATIONS:  Current Outpatient Medications  Medication Sig Dispense Refill   albuterol (VENTOLIN HFA) 108 (90 Base) MCG/ACT inhaler Inhale 2 puffs into the lungs every 6 (six) hours as needed for wheezing or shortness of breath. 8 g 6   amLODipine (NORVASC) 10 MG tablet Take 10 mg by mouth daily.     dronabinol (MARINOL) 2.5 MG capsule Take 1 capsule (2.5 mg total) by mouth 2 (two) times daily before lunch and supper. 60 capsule 0   esomeprazole (NEXIUM) 20 MG capsule Take 20 mg by mouth daily at 12 noon.     ibuprofen (ADVIL) 200 MG tablet Take 200 mg by mouth as needed.     mirtazapine (REMERON) 15 MG tablet Take 1 tablet (15 mg total) by mouth at bedtime. 30 tablet 2   tamsulosin (FLOMAX) 0.4 MG CAPS capsule Take 0.4 mg by mouth at bedtime.     No current facility-administered medications for this visit.    SURGICAL HISTORY:  Past Surgical History:  Procedure Laterality Date   BIOPSY  01/11/2022   Procedure: BIOPSY;  Surgeon: Garner Nash, DO;  Location: [facility];  Service: Pulmonary;;   BRAIN SURGERY     BRONCHIAL BRUSHINGS  01/11/2022   Procedure: BRONCHIAL BRUSHINGS;  Surgeon: Garner Nash, DO;  Location: [facility];  Service: Pulmonary;;   BRONCHIAL NEEDLE ASPIRATION BIOPSY  01/11/2022   Procedure: BRONCHIAL NEEDLE ASPIRATION BIOPSIES;  Surgeon: Garner Nash, DO;  Location: [facility];  Service: Pulmonary;;   VIDEO BRONCHOSCOPY WITH ENDOBRONCHIAL ULTRASOUND Left 01/11/2022   Procedure: VIDEO BRONCHOSCOPY WITH ENDOBRONCHIAL ULTRASOUND;  Surgeon: Garner Nash, DO;  Location: [facility];  Service: Pulmonary;  Laterality: Left;    REVIEW OF SYSTEMS:   Review of Systems  Constitutional:  Negative for appetite change, chills, fatigue, fever and unexpected weight change.  HENT:   Negative for mouth sores, nosebleeds, sore throat and trouble swallowing.   Eyes: Negative for eye problems and icterus.  Respiratory: Negative for cough, hemoptysis, shortness of breath and wheezing.   Cardiovascular: Negative for chest pain and leg swelling.  Gastrointestinal: Negative for abdominal pain, constipation, diarrhea, nausea and vomiting.  Genitourinary: Negative for bladder incontinence, difficulty urinating, dysuria, frequency and hematuria.   Musculoskeletal: Negative for back pain, gait problem, neck pain and neck stiffness.  Skin: Negative for itching and rash.  Neurological: Negative for dizziness, extremity weakness, gait problem, headaches, light-headedness and seizures.  Hematological: Negative for adenopathy. Does not bruise/bleed easily.  Psychiatric/Behavioral: Negative for confusion, depression and sleep disturbance. The patient is not nervous/anxious.     PHYSICAL EXAMINATION:  There were no vitals taken for this visit.  ECOG PERFORMANCE STATUS: {CHL ONC ECOG [id number]  Physical Exam  Constitutional: Oriented to person, place, and time and well-developed, well-nourished, and in no distress. No distress.  HENT:  Head: Normocephalic and atraumatic.  Mouth/Throat: Oropharynx is clear and moist. No oropharyngeal exudate.  Eyes: Conjunctivae are normal. Right eye exhibits no discharge. Left eye exhibits no discharge. No scleral icterus.  Neck: Normal range of motion. Neck supple.  Cardiovascular: Normal rate, regular rhythm, normal heart sounds and intact distal pulses.   Pulmonary/Chest: Effort normal and breath sounds normal. No respiratory distress. No wheezes. No rales.  Abdominal: Soft. Bowel sounds are normal. Exhibits no distension and no mass. There is no tenderness.  Musculoskeletal: Normal range of motion. Exhibits no edema.  Lymphadenopathy:    No cervical  adenopathy.  Neurological: Alert and oriented to person, place, and time. Exhibits normal muscle tone. Gait normal. Coordination normal.  Skin: Skin is warm and dry. No rash noted. Not diaphoretic. No erythema. No pallor.  Psychiatric: Mood, memory and judgment normal.  Vitals reviewed.  LABORATORY DATA: Lab Results  Component Value Date   WBC 7.3 10/30/2022   HGB 13.3 10/30/2022   HCT 42.1 10/30/2022   MCV 82.9 10/30/2022   PLT 320 10/30/2022      Chemistry      Component Value Date/Time   NA 137 10/30/2022 1008   K 4.1 10/30/2022 1008   CL 105 10/30/2022 1008   CO2 22 10/30/2022 1008   BUN 11 10/30/2022 1008   CREATININE 1.39 (H) 10/30/2022 1008      Component Value Date/Time   CALCIUM 8.8 (L) 10/30/2022 1008   ALKPHOS 109 10/30/2022 1008   AST 13 (L) 10/30/2022 1008   ALT 10 10/30/2022 1008   BILITOT 0.6 10/30/2022 1008       RADIOGRAPHIC STUDIES:  No results found.   ASSESSMENT/PLAN:  This is a very pleasant 71 year old Caucasian male diagnosed with stage IIIb (T4, N2, M0) non-small cell lung cancer,  squamous cell carcinoma.  The patient presented with a left upper lobe mass in addition to left hilar and subcarinal lymphadenopathy.  He was diagnosed in February 2023.  The patient has no actionable mutations and his PD-L1 expression is 90%.    The patient completed a course of concurrent chemoradiation with weekly carboplatin for an AUC of 2 and paclitaxel 45 mg per metered squared.  He status post 6 cycles.  His last dose of treatment was given on 03/04/2022.  He had a partial response to treatment.  The patient is currently undergoing consolidation immunotherapy with Imfinzi 1500 mg IV every 4 weeks.  He is status post 8 cycles.   At the patient's last CT scan in October 2023, the patient had a scan that showed multifocal interstitial prominence and aggressive appearing nodularity throughout the right lung suspicious for inflammatory process versus multifocal  metastatic disease. We are monitoring this area closely and the patient had a repeat CT scan yesterday to evaluate this.  The patient was seen with Dr. Julien Nordmann today.  Dr. Julien Nordmann personally independently reviewed his scan discussed results with patient today.  The scan showed ***  Dr. Julien Nordmann recommends that the patient ***b on the same treatment at the same dose.  We will see him back for follow-up visit in 4 weeks for evaluation repeat blood work before undergoing cycle #10.  Continue taking Remeron and Marinol for his decreased appetite.   The patient was advised to call immediately if he has any concerning symptoms in the interval. The patient voices understanding of current disease status and treatment options and is in agreement with the current care plan. All questions were answered. The patient knows to call the clinic with any problems, questions or concerns. We can certainly see the patient much sooner if necessary        No orders of the defined types were placed in this encounter.    I spent {CHL ONC TIME VISIT - KSKSH:[id number]} counseling the patient face to face. The total time spent in the appointment was {CHL ONC TIME VISIT - XVEZB:[id number]}.  Debrah Granderson L Delila Kuklinski, PA-C 11/24/22

## 2022-11-25 ENCOUNTER — Ambulatory Visit (HOSPITAL_COMMUNITY)
Admission: RE | Admit: 2022-11-25 | Discharge: 2022-11-25 | Disposition: A | Discharge status: Home / Self Care | Payer: PPO | Source: Ambulatory Visit | Attending: Physician Assistant | Admitting: Physician Assistant

## 2022-11-25 DIAGNOSIS — C3432 Malignant neoplasm of lower lobe, left bronchus or lung: Secondary | ICD-10-CM | POA: Diagnosis not present

## 2022-11-25 DIAGNOSIS — J439 Emphysema, unspecified: Secondary | ICD-10-CM | POA: Diagnosis not present

## 2022-11-25 DIAGNOSIS — C3490 Malignant neoplasm of unspecified part of unspecified bronchus or lung: Secondary | ICD-10-CM | POA: Diagnosis not present

## 2022-11-25 DIAGNOSIS — R918 Other nonspecific abnormal finding of lung field: Secondary | ICD-10-CM | POA: Diagnosis not present

## 2022-11-25 MED ORDER — IOHEXOL 300 MG/ML  SOLN
75.0000 mL | Freq: Once | INTRAMUSCULAR | Status: AC | PRN
  Administered 2022-11-25: 75 mL via INTRAVENOUS

## 2022-11-25 MED ORDER — SODIUM CHLORIDE (PF) 0.9 % IJ SOLN
INTRAMUSCULAR | Status: AC
  Filled 2022-11-25: qty 50

## 2022-11-27 ENCOUNTER — Inpatient Hospital Stay: Payer: PPO | Attending: Radiation Oncology

## 2022-11-27 ENCOUNTER — Inpatient Hospital Stay: Payer: PPO

## 2022-11-27 ENCOUNTER — Other Ambulatory Visit (HOSPITAL_COMMUNITY): Payer: Self-pay

## 2022-11-27 ENCOUNTER — Other Ambulatory Visit: Payer: Self-pay

## 2022-11-27 ENCOUNTER — Inpatient Hospital Stay (HOSPITAL_BASED_OUTPATIENT_CLINIC_OR_DEPARTMENT_OTHER): Payer: PPO | Admitting: Physician Assistant

## 2022-11-27 VITALS — BP 107/60 | HR 94 | Temp 97.7°F | Resp 14 | Wt 154.9 lb

## 2022-11-27 DIAGNOSIS — C3432 Malignant neoplasm of lower lobe, left bronchus or lung: Secondary | ICD-10-CM

## 2022-11-27 DIAGNOSIS — R590 Localized enlarged lymph nodes: Secondary | ICD-10-CM | POA: Diagnosis not present

## 2022-11-27 DIAGNOSIS — Z9221 Personal history of antineoplastic chemotherapy: Secondary | ICD-10-CM | POA: Insufficient documentation

## 2022-11-27 DIAGNOSIS — Z5112 Encounter for antineoplastic immunotherapy: Secondary | ICD-10-CM | POA: Insufficient documentation

## 2022-11-27 DIAGNOSIS — E876 Hypokalemia: Secondary | ICD-10-CM

## 2022-11-27 DIAGNOSIS — C3412 Malignant neoplasm of upper lobe, left bronchus or lung: Secondary | ICD-10-CM | POA: Diagnosis not present

## 2022-11-27 DIAGNOSIS — Z79899 Other long term (current) drug therapy: Secondary | ICD-10-CM | POA: Diagnosis not present

## 2022-11-27 LAB — CMP (CANCER CENTER ONLY)
ALT: <5 U/L (ref 0–44)
AST: 7 U/L — ABNORMAL LOW (ref 15–41)
Albumin: 3.6 g/dL (ref 3.5–5.0)
Alkaline Phosphatase: 117 U/L (ref 38–126)
Anion gap: 6 (ref 5–15)
BUN: 9 mg/dL (ref 8–23)
CO2: 27 mmol/L (ref 22–32)
Calcium: 8.8 mg/dL — ABNORMAL LOW (ref 8.9–10.3)
Chloride: 106 mmol/L (ref 98–111)
Creatinine: 1.29 mg/dL — ABNORMAL HIGH (ref 0.61–1.24)
GFR, Estimated: 59 mL/min — ABNORMAL LOW (ref >60)
Glucose, Bld: 118 mg/dL — ABNORMAL HIGH (ref 70–99)
Potassium: 3.1 mmol/L — ABNORMAL LOW (ref 3.5–5.1)
Sodium: 139 mmol/L (ref 135–145)
Total Bilirubin: 0.4 mg/dL (ref 0.3–1.2)
Total Protein: 6.9 g/dL (ref 6.5–8.1)

## 2022-11-27 LAB — CBC WITH DIFFERENTIAL (CANCER CENTER ONLY)
Abs Immature Granulocytes: 0.03 10*3/uL (ref 0.00–0.07)
Basophils Absolute: 0.1 10*3/uL (ref 0.0–0.1)
Basophils Relative: 1 %
Eosinophils Absolute: 0.1 10*3/uL (ref 0.0–0.5)
Eosinophils Relative: 1 %
HCT: 40.1 % (ref 39.0–52.0)
Hemoglobin: 12.7 g/dL — ABNORMAL LOW (ref 13.0–17.0)
Immature Granulocytes: 0 %
Lymphocytes Relative: 8 %
Lymphs Abs: 0.7 10*3/uL (ref 0.7–4.0)
MCH: 25.7 pg — ABNORMAL LOW (ref 26.0–34.0)
MCHC: 31.7 g/dL (ref 30.0–36.0)
MCV: 81.2 fL (ref 80.0–100.0)
Monocytes Absolute: 0.7 10*3/uL (ref 0.1–1.0)
Monocytes Relative: 9 %
Neutro Abs: 6.5 10*3/uL (ref 1.7–7.7)
Neutrophils Relative %: 81 %
Platelet Count: 373 10*3/uL (ref 150–400)
RBC: 4.94 MIL/uL (ref 4.22–5.81)
RDW: 15.6 % — ABNORMAL HIGH (ref 11.5–15.5)
WBC Count: 8 10*3/uL (ref 4.0–10.5)
nRBC: 0 % (ref 0.0–0.2)

## 2022-11-27 MED ORDER — SODIUM CHLORIDE 0.9 % IV SOLN: Freq: Once | INTRAVENOUS | Status: AC

## 2022-11-27 MED ORDER — POTASSIUM CHLORIDE CRYS ER 20 MEQ PO TBCR: 20.0000 meq | EXTENDED_RELEASE_TABLET | Freq: Every day | ORAL | 0 refills | Status: DC

## 2022-11-27 MED ORDER — SODIUM CHLORIDE 0.9 % IV SOLN
1500.0000 mg | Freq: Once | INTRAVENOUS | Status: AC
  Administered 2022-11-27: 1500 mg via INTRAVENOUS
  Filled 2022-11-27: qty 30

## 2022-11-29 ENCOUNTER — Other Ambulatory Visit: Payer: Self-pay

## 2022-11-29 LAB — T4: T4, Total: 6.9 ug/dL (ref 4.5–12.0)

## 2022-12-03 ENCOUNTER — Other Ambulatory Visit: Payer: Self-pay

## 2022-12-03 DIAGNOSIS — R972 Elevated prostate specific antigen [PSA]: Secondary | ICD-10-CM | POA: Diagnosis not present

## 2022-12-06 ENCOUNTER — Other Ambulatory Visit: Payer: Self-pay

## 2022-12-16 ENCOUNTER — Telehealth: Payer: Self-pay | Admitting: Internal Medicine

## 2022-12-16 NOTE — Telephone Encounter (Signed)
Called patient regarding upcoming January and March appointments, patient is notified.

## 2022-12-25 ENCOUNTER — Inpatient Hospital Stay (HOSPITAL_BASED_OUTPATIENT_CLINIC_OR_DEPARTMENT_OTHER): Payer: PPO | Admitting: Internal Medicine

## 2022-12-25 ENCOUNTER — Other Ambulatory Visit: Payer: Self-pay

## 2022-12-25 ENCOUNTER — Encounter: Payer: Self-pay | Admitting: Internal Medicine

## 2022-12-25 ENCOUNTER — Inpatient Hospital Stay: Payer: PPO | Attending: Radiation Oncology

## 2022-12-25 ENCOUNTER — Inpatient Hospital Stay: Payer: PPO

## 2022-12-25 VITALS — BP 134/73 | HR 69 | Resp 17

## 2022-12-25 DIAGNOSIS — C3432 Malignant neoplasm of lower lobe, left bronchus or lung: Secondary | ICD-10-CM

## 2022-12-25 DIAGNOSIS — Z79899 Other long term (current) drug therapy: Secondary | ICD-10-CM | POA: Insufficient documentation

## 2022-12-25 DIAGNOSIS — C3412 Malignant neoplasm of upper lobe, left bronchus or lung: Secondary | ICD-10-CM | POA: Insufficient documentation

## 2022-12-25 DIAGNOSIS — Z9221 Personal history of antineoplastic chemotherapy: Secondary | ICD-10-CM | POA: Diagnosis not present

## 2022-12-25 DIAGNOSIS — Z5112 Encounter for antineoplastic immunotherapy: Secondary | ICD-10-CM | POA: Insufficient documentation

## 2022-12-25 DIAGNOSIS — Z923 Personal history of irradiation: Secondary | ICD-10-CM | POA: Diagnosis not present

## 2022-12-25 LAB — CMP (CANCER CENTER ONLY)
ALT: 7 U/L (ref 0–44)
AST: 11 U/L — ABNORMAL LOW (ref 15–41)
Albumin: 3.6 g/dL (ref 3.5–5.0)
Alkaline Phosphatase: 98 U/L (ref 38–126)
Anion gap: 7 (ref 5–15)
BUN: 9 mg/dL (ref 8–23)
CO2: 27 mmol/L (ref 22–32)
Calcium: 8.9 mg/dL (ref 8.9–10.3)
Chloride: 104 mmol/L (ref 98–111)
Creatinine: 1.13 mg/dL (ref 0.61–1.24)
GFR, Estimated: >60 mL/min (ref >60)
Glucose, Bld: 90 mg/dL (ref 70–99)
Potassium: 3.4 mmol/L — ABNORMAL LOW (ref 3.5–5.1)
Sodium: 138 mmol/L (ref 135–145)
Total Bilirubin: 0.5 mg/dL (ref 0.3–1.2)
Total Protein: 6.9 g/dL (ref 6.5–8.1)

## 2022-12-25 LAB — CBC WITH DIFFERENTIAL (CANCER CENTER ONLY)
Abs Immature Granulocytes: 0.01 10*3/uL (ref 0.00–0.07)
Basophils Absolute: 0 10*3/uL (ref 0.0–0.1)
Basophils Relative: 0 %
Eosinophils Absolute: 0.1 10*3/uL (ref 0.0–0.5)
Eosinophils Relative: 1 %
HCT: 41.5 % (ref 39.0–52.0)
Hemoglobin: 13.2 g/dL (ref 13.0–17.0)
Immature Granulocytes: 0 %
Lymphocytes Relative: 11 %
Lymphs Abs: 0.7 10*3/uL (ref 0.7–4.0)
MCH: 26.1 pg (ref 26.0–34.0)
MCHC: 31.8 g/dL (ref 30.0–36.0)
MCV: 82.2 fL (ref 80.0–100.0)
Monocytes Absolute: 0.7 10*3/uL (ref 0.1–1.0)
Monocytes Relative: 11 %
Neutro Abs: 4.6 10*3/uL (ref 1.7–7.7)
Neutrophils Relative %: 77 %
Platelet Count: 296 10*3/uL (ref 150–400)
RBC: 5.05 MIL/uL (ref 4.22–5.81)
RDW: 16 % — ABNORMAL HIGH (ref 11.5–15.5)
WBC Count: 6.1 10*3/uL (ref 4.0–10.5)
nRBC: 0 % (ref 0.0–0.2)

## 2022-12-25 LAB — TSH: TSH: 1.363 u[IU]/mL (ref 0.350–4.500)

## 2022-12-25 MED ORDER — SODIUM CHLORIDE 0.9 % IV SOLN: Freq: Once | INTRAVENOUS | Status: AC

## 2022-12-25 MED ORDER — SODIUM CHLORIDE 0.9 % IV SOLN
1500.0000 mg | Freq: Once | INTRAVENOUS | Status: AC
  Administered 2022-12-25: 1500 mg via INTRAVENOUS
  Filled 2022-12-25: qty 30

## 2022-12-25 NOTE — Progress Notes (Signed)
[facility] Telephone:([phone]) [phone]   Fax:([phone]  OFFICE PROGRESS NOTE  [name], MD [phone] [place] [address]  DIAGNOSIS: Stage IIIb (T4, N2, M0) non-small cell lung cancer, squamous cell carcinoma presented with large left upper lobe lung mass with left hilar and subcarinal lymphadenopathy diagnosed in February 2023.  Molecular study by [employer] 360 DETECTED ALTERATION(S) / BIOMARKER(S) % CFDNA OR AMPLIFICATION ASSOCIATED FDA-APPROVED THERAPIES CLINICAL TRIAL AVAILABILITY [id number] 4.4% None  Yes [id number] 1.1% None  Yes  PDL1 Expression 90%  PRIOR THERAPY:A course of concurrent chemoradiation with weekly carboplatin for AUC of 2 and paclitaxel 45 Mg/M2.  Status post 6 cycles.  Last dose was given March 04, 2022 with partial response.  CURRENT THERAPY: Consolidation treatment with immunotherapy with Imfinzi 1500 Mg IV every 4 weeks.  First dose Apr 17, 2022.  Status post 9 cycles.  INTERVAL HISTORY: Peter Diaz 72 y.o. male returns to the clinic today for follow-up visit accompanied by his wife.  The patient is feeling fine today with no concerning complaints.  He denied having any current chest pain, shortness of breath, cough or hemoptysis.  He has no nausea, vomiting, diarrhea or constipation.  He has no headache or visual changes.  He has no recent weight loss or night sweats.  He continues to tolerate his treatment with immunotherapy fairly well.  He is here today for evaluation before starting cycle #10.   MEDICAL HISTORY: Past Medical History:  Diagnosis Date   GERD (gastroesophageal reflux disease)    Gout    History of radiation therapy    Left lung- (01/28/22-03/08/22)- Dr. Antony Blackbird   HTN (hypertension)     ALLERGIES:  has No Known Allergies.  MEDICATIONS:  Current Outpatient Medications  Medication Sig Dispense Refill   albuterol (VENTOLIN HFA) 108 (90 Base) MCG/ACT inhaler Inhale 2 puffs  into the lungs every 6 (six) hours as needed for wheezing or shortness of breath. 8 g 6   amLODipine (NORVASC) 10 MG tablet Take 10 mg by mouth daily.     dronabinol (MARINOL) 2.5 MG capsule Take 1 capsule (2.5 mg total) by mouth 2 (two) times daily before lunch and supper. 60 capsule 0   esomeprazole (NEXIUM) 20 MG capsule Take 20 mg by mouth daily at 12 noon.     ibuprofen (ADVIL) 200 MG tablet Take 200 mg by mouth as needed.     mirtazapine (REMERON) 15 MG tablet Take 1 tablet (15 mg total) by mouth at bedtime. 30 tablet 2   potassium chloride SA (KLOR-CON M) 20 MEQ tablet Take 1 tablet (20 mEq total) by mouth daily. 7 tablet 0   tamsulosin (FLOMAX) 0.4 MG CAPS capsule Take 0.4 mg by mouth at bedtime.     No current facility-administered medications for this visit.    SURGICAL HISTORY:  Past Surgical History:  Procedure Laterality Date   BIOPSY  01/11/2022   Procedure: BIOPSY;  Surgeon: Josephine Igo, DO;  Location: [facility];  Service: Pulmonary;;   BRAIN SURGERY     BRONCHIAL BRUSHINGS  01/11/2022   Procedure: BRONCHIAL BRUSHINGS;  Surgeon: Josephine Igo, DO;  Location: [facility];  Service: Pulmonary;;   BRONCHIAL NEEDLE ASPIRATION BIOPSY  01/11/2022   Procedure: BRONCHIAL NEEDLE ASPIRATION BIOPSIES;  Surgeon: Josephine Igo, DO;  Location: [facility];  Service: Pulmonary;;   VIDEO BRONCHOSCOPY WITH ENDOBRONCHIAL ULTRASOUND Left 01/11/2022   Procedure: VIDEO BRONCHOSCOPY WITH ENDOBRONCHIAL ULTRASOUND;  Surgeon:  Garner Nash, DO;  Location: [facility];  Service: Pulmonary;  Laterality: Left;    REVIEW OF SYSTEMS:  A comprehensive review of systems was negative.   PHYSICAL EXAMINATION: General appearance: alert, cooperative, and no distress Head: Normocephalic, without obvious abnormality, atraumatic Neck: no adenopathy, no JVD, supple, symmetrical, trachea midline, and thyroid not enlarged, symmetric, no tenderness/mass/nodules Lymph nodes: Cervical, supraclavicular,  and axillary nodes normal. Resp: clear to auscultation bilaterally Back: symmetric, no curvature. ROM normal. No CVA tenderness. Cardio: regular rate and rhythm, S1, S2 normal, no murmur, click, rub or gallop GI: soft, non-tender; bowel sounds normal; no masses,  no organomegaly Extremities: extremities normal, atraumatic, no cyanosis or edema  ECOG PERFORMANCE STATUS: 1 - Symptomatic but completely ambulatory  Blood pressure 116/67, pulse 83, temperature (!) 97.2 F (36.2 C), temperature source Tympanic, resp. rate 18, weight 154 lb 1 oz (69.9 kg), SpO2 98 %.  LABORATORY DATA: Lab Results  Component Value Date   WBC 6.1 12/25/2022   HGB 13.2 12/25/2022   HCT 41.5 12/25/2022   MCV 82.2 12/25/2022   PLT 296 12/25/2022      Chemistry      Component Value Date/Time   NA 139 11/27/2022 1111   K 3.1 (L) 11/27/2022 1111   CL 106 11/27/2022 1111   CO2 27 11/27/2022 1111   BUN 9 11/27/2022 1111   CREATININE 1.29 (H) 11/27/2022 1111      Component Value Date/Time   CALCIUM 8.8 (L) 11/27/2022 1111   ALKPHOS 117 11/27/2022 1111   AST 7 (L) 11/27/2022 1111   ALT <5 11/27/2022 1111   BILITOT 0.4 11/27/2022 1111       RADIOGRAPHIC STUDIES: CT Chest W Contrast  Result Date: 11/27/2022 CLINICAL DATA:  Non-small cell lung cancer, restaging. * Tracking Code: BO *. EXAM: CT CHEST WITH CONTRAST TECHNIQUE: Multidetector CT imaging of the chest was performed during intravenous contrast administration. RADIATION DOSE REDUCTION: This exam was performed according to the departmental dose-optimization program which includes automated exposure control, adjustment of the mA and/or kV according to patient size and/or use of iterative reconstruction technique. CONTRAST:  10mL OMNIPAQUE IOHEXOL 300 MG/ML  SOLN COMPARISON:  09/26/2022 FINDINGS: Cardiovascular: The heart size appears within normal limits. Aortic atherosclerotic calcifications. No pericardial effusion. Mediastinum/Nodes: Thyroid gland,  trachea and esophagus are unremarkable. No enlarged axillary, supraclavicular, mediastinal or hilar lymph nodes. Lungs/Pleura: Moderate centrilobular and paraseptal emphysema. Small left pleural effusion is unchanged in volume compared with the previous exam. Again seen is appearance of masslike architectural distortion and fibrosis within the perihilar left lung with associated left upper lobe and left lower lobe volume loss. Compared with the previous exam there is progressive area of soft tissue thickening along the medial aspect of the oblique fissure adjacent to the left hilum, image 58/2. This measures approximately 1.4 x 4.1 cm. This is nonspecific and may reflect progressive changes of external beam radiation. Previously noted right middle lobe lung nodule now appears entirely ground-glass measuring 1.8 x 1.2, image 84/5. Previously 1.7 x 1.2 cm. The previously noted nodule within the anteromedial right middle lobe has resolved in the interval with mild residual ground-glass attenuation in its place, image 88/5. Similarly, the right lower lobe retro hilar nodular opacity has resolved with residual ground-glass attenuation in its place. No new pulmonary nodule or mass identified. Upper Abdomen: No acute findings. Normal appearance of the adrenal glands. Gallstone measures 1.2 cm, image 154/2. Musculoskeletal: No acute or suspicious osseous findings. IMPRESSION: 1. Again seen are  changes of external beam radiation within the left lung including masslike architectural distortion, fibrosis and volume loss. 2. There is progressive area of soft tissue thickening along the medial aspect of the oblique fissure adjacent to the left hilum. This is nonspecific and likely reflects progressive changes of external beam radiation. This would be difficult to distinguish from locally recurrent tumor and continued interval follow-up is advised. 3. The previously noted nodular densities within the right lung have resolved in  the interval with residual areas ground-glass attenuation in their place. Favor inflammatory/infectious etiologies. 4. Stable appearance of small left pleural effusion. 5. Aortic Atherosclerosis (ICD10-I70.0) and Emphysema (ICD10-J43.9). Electronically Signed   By: Signa Kell M.D.   On: 11/27/2022 10:50    ASSESSMENT AND PLAN: This is a very pleasant 72 years old white male diagnosed with stage IIIb (T4, N2, M0) non-small cell lung cancer, squamous cell carcinoma presented with large left upper lobe lung mass in addition to left hilar and subcarinal lymphadenopathy diagnosed in February 2023.  The patient has no actionable mutation and PD-L1 expression is 90%. He had MRI of the brain performed recently that showed no concerning findings for disease metastasis to the brain. The patient completed a course of concurrent chemoradiation with weekly carboplatin for AUC of 2 and paclitaxel 45 Mg/M2 status post 6 week of treatment.  The last dose of his treatment was given on March 04, 2022 with partial response.  He tolerated this treatment well except for radiation-induced esophagitis with odynophagia. He is currently undergoing consolidation treatment with Imfinzi 1500 Mg IV every 4 weeks, status post 9 cycles. The patient has been tolerating this treatment well with no concerning adverse effects. I recommended for him to proceed with cycle #10 today as planned. I will see him back for follow-up visit in 4 weeks for evaluation before starting cycle #11. For the weight loss, he will continue his current treatment with Remeron.  The patient was advised to call immediately if he has any concerning symptoms in the interval.  The patient voices understanding of current disease status and treatment options and is in agreement with the current care plan.  All questions were answered. The patient knows to call the clinic with any problems, questions or concerns. We can certainly see the patient much sooner if  necessary.  The total time spent in the appointment was 20 minutes.  Disclaimer: This note was dictated with voice recognition software. Similar sounding words can inadvertently be transcribed and may not be corrected upon review.

## 2022-12-25 NOTE — Patient Instructions (Signed)
[facility]  Discharge Instructions: Thank you for choosing [facility] to provide your oncology and hematology care.   If you have a lab appointment with the Cancer Center, please go directly to the Cancer Center and check in at the registration area.   Wear comfortable clothing and clothing appropriate for easy access to any Portacath or PICC line.   We strive to give you quality time with your provider. You may need to reschedule your appointment if you arrive late (15 or more minutes).  Arriving late affects you and other patients whose appointments are after yours.  Also, if you miss three or more appointments without notifying the office, you may be dismissed from the clinic at the provider's discretion.      For prescription refill requests, have your pharmacy contact our office and allow 72 hours for refills to be completed.    Today you received the following chemotherapy and/or immunotherapy agents: Durvalumab      To help prevent nausea and vomiting after your treatment, we encourage you to take your nausea medication as directed.  BELOW ARE SYMPTOMS THAT SHOULD BE REPORTED IMMEDIATELY: *FEVER GREATER THAN 100.4 F (38 C) OR HIGHER *CHILLS OR SWEATING *NAUSEA AND VOMITING THAT IS NOT CONTROLLED WITH YOUR NAUSEA MEDICATION *UNUSUAL SHORTNESS OF BREATH *UNUSUAL BRUISING OR BLEEDING *URINARY PROBLEMS (pain or burning when urinating, or frequent urination) *BOWEL PROBLEMS (unusual diarrhea, constipation, pain near the anus) TENDERNESS IN MOUTH AND THROAT WITH OR WITHOUT PRESENCE OF ULCERS (sore throat, sores in mouth, or a toothache) UNUSUAL RASH, SWELLING OR PAIN  UNUSUAL VAGINAL DISCHARGE OR ITCHING   Items with * indicate a potential emergency and should be followed up as soon as possible or go to the Emergency Department if any problems should occur.  Please show the CHEMOTHERAPY ALERT CARD or IMMUNOTHERAPY ALERT CARD at check-in to  the Emergency Department and triage nurse.  Should you have questions after your visit or need to cancel or reschedule your appointment, please contact [facility]  Dept: [phone]  and follow the prompts.  Office hours are 8:00 a.m. to 4:30 p.m. Monday - Friday. Please note that voicemails left after 4:00 p.m. may not be returned until the following business day.  We are closed weekends and major holidays. You have access to a nurse at all times for urgent questions. Please call the main number to the clinic Dept: [phone] and follow the prompts.   For any non-urgent questions, you may also contact your provider using MyChart. We now offer e-Visits for anyone 80 and older to request care online for non-urgent symptoms. For details visit [email or web address].   Also download the MyChart app! Go to the app store, search "MyChart", open the app, select [facility], and log in with your MyChart username and password.

## 2023-01-11 IMAGING — MR MR HEAD WO/W CM
13 series · 48 of 48 positions shown · IV contrast (gadavist)
Comparison: PET-CT 01/08/2022. Report from head CT 02/19/1999
(images unavailable).

CLINICAL DATA: Provided history: Malignant neoplasm of unspecified
part of unspecified bronchus or lung. Non-small cell lung cancer,
staging.

EXAM:
MRI HEAD WITHOUT AND WITH CONTRAST
TECHNIQUE: Multiplanar, multiecho pulse sequences of the brain and surrounding
structures were obtained without and with intravenous contrast.
CONTRAST:  7mL GADAVIST GADOBUTROL 1 MMOL/ML IV SOLN

[Series 5: DWI · axial · 3.0mm · 1.36mm/px · z∈[-44,+101]mm · 5 of 100 slices shown (1 of 2)]
[im 1/100]
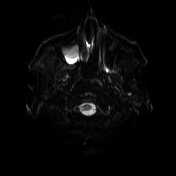
[im 25/100]
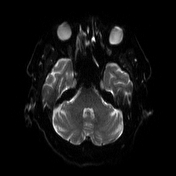
[im 50/100]
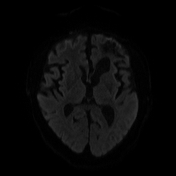
[im 75/100]
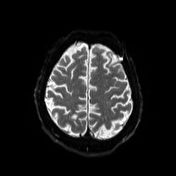
[im 100/100]
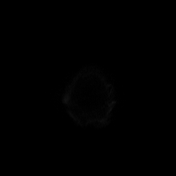

[Series 6: DWI · axial · 3.0mm · 1.36mm/px · z∈[-44,+101]mm · 2 of 50 slices shown (2 of 2)]
[im 1/50]
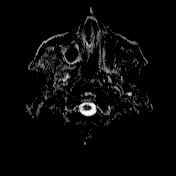
[im 50/50]
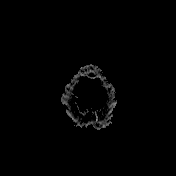

[Series 7: T1 · sagittal · 5.0mm · 0.75mm/px · 2 of 26 slices shown (1 of 2)]
[im 1/26]
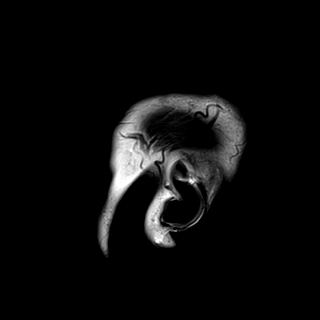
[im 26/26]
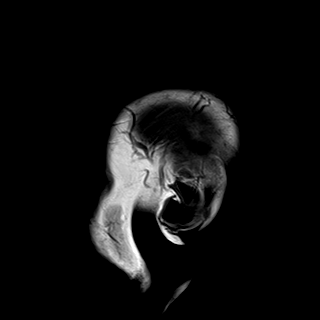

[Series 8: T2 · axial · 5.0mm · 0.62mm/px · z∈[-54,+107]mm · 2 of 26 slices shown]
[im 1/26]
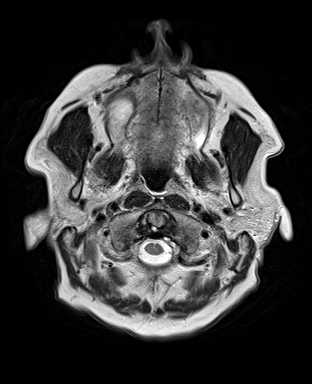
[im 26/26]
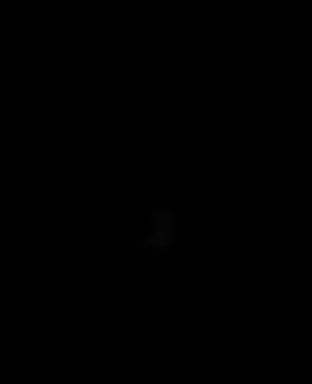

[Series 9: swi_images · axial · 3.0mm · 0.75mm/px · z∈[-49,+103]mm · 3 of 52 slices shown]
[im 1/52]
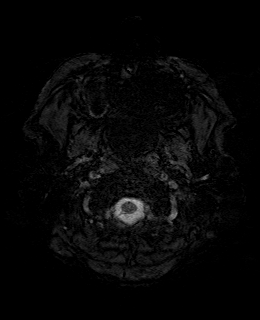
[im 26/52]
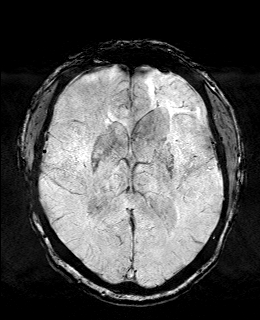
[im 52/52]
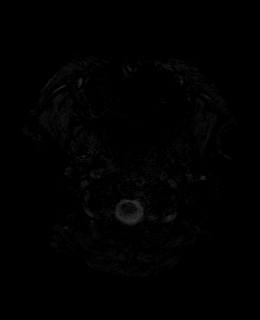

[Series 11: FLAIR · axial · 3.0mm · 0.75mm/px · z∈[-49,+103]mm · 3 of 52 slices shown]
[im 1/52]
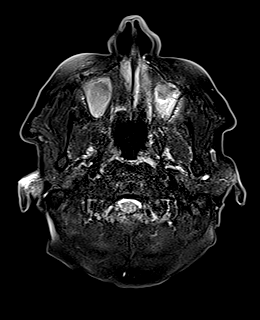
[im 26/52]
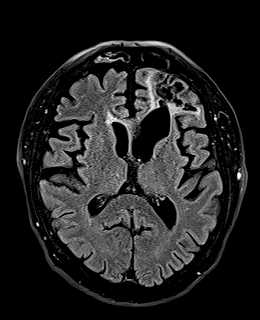
[im 52/52]
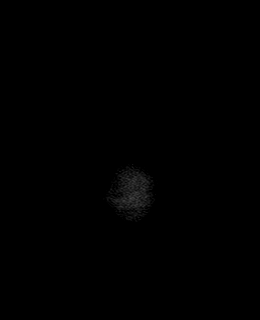

[Series 12: T1 · axial · 1.0mm · 0.94mm/px · z∈[-58,+100]mm · 10 of 160 slices shown (2 of 2)]
[im 1/160]
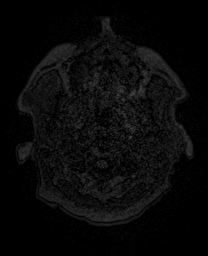
[im 18/160]
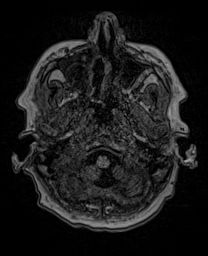
[im 36/160]
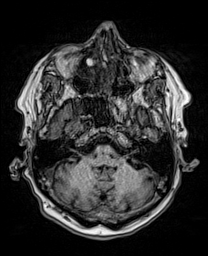
[im 54/160]
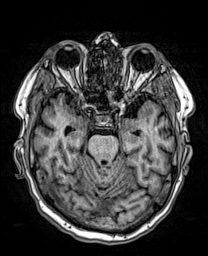
[im 71/160]
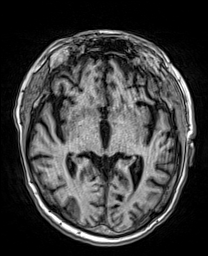
[im 89/160]
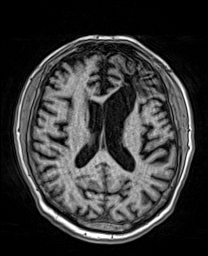
[im 107/160]
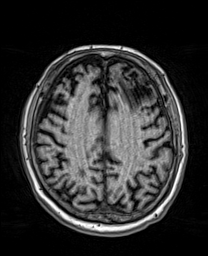
[im 124/160]
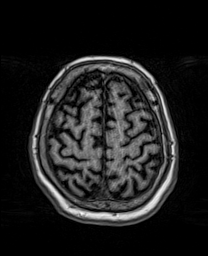
[im 142/160]
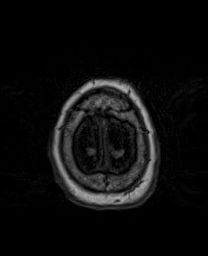
[im 160/160]
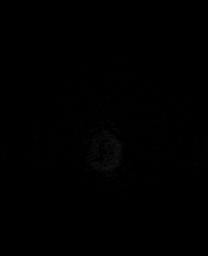

[Series 13: cor dwi_tracew · coronal · 5.0mm · 1.53mm/px · 3 of 54 slices shown]
[im 1/54]
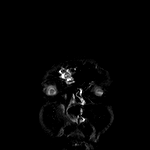
[im 27/54]
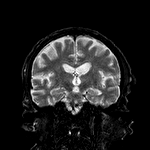
[im 54/54]
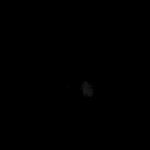

[Series 14: cor dwi_adc · coronal · 5.0mm · 1.53mm/px · 2 of 27 slices shown]
[im 1/27]
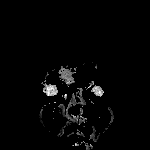
[im 27/27]
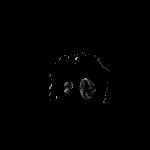

[Series 15: T2 post-contrast · coronal · 5.0mm · 0.69mm/px · 2 of 27 slices shown]
[im 1/27]
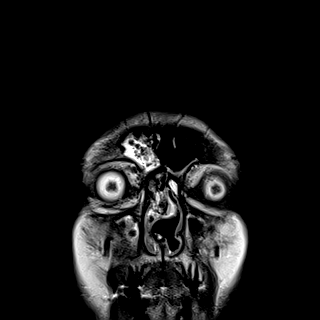
[im 27/27]
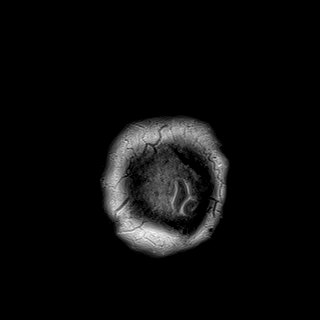

[Series 16: T1 post-contrast · axial · 1.0mm · 0.94mm/px · z∈[-58,+100]mm · 10 of 160 slices shown (1 of 3)]
[im 1/160]
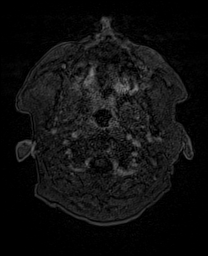
[im 18/160]
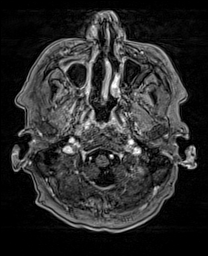
[im 36/160]
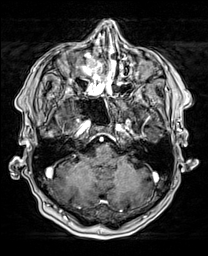
[im 54/160]
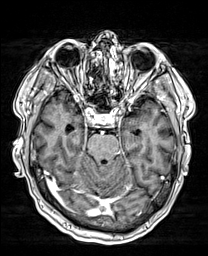
[im 71/160]
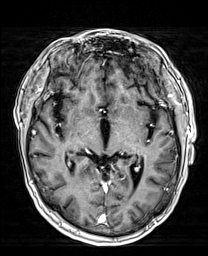
[im 89/160]
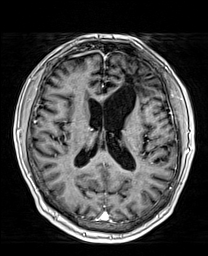
[im 107/160]
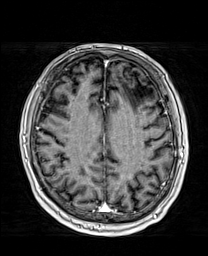
[im 124/160]
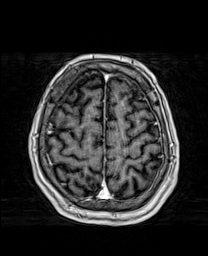
[im 142/160]
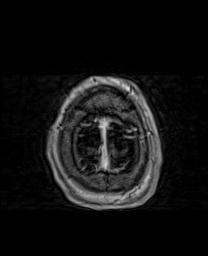
[im 160/160]
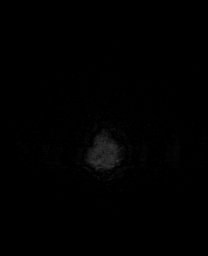

[Series 17: T1 post-contrast · coronal · 5.0mm · 0.43mm/px · 2 of 27 slices shown (2 of 3)]
[im 1/27]
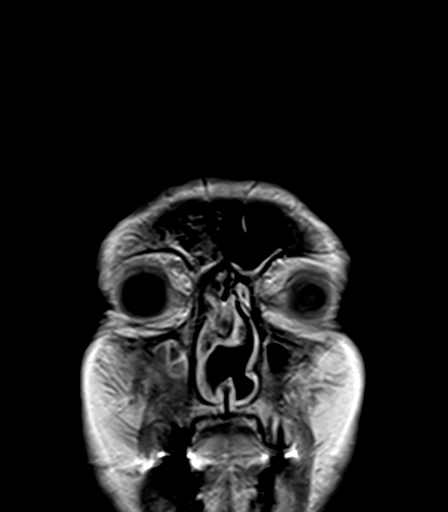
[im 27/27]
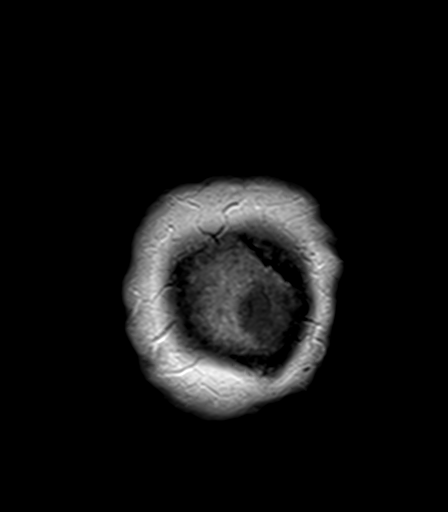

[Series 18: T1 post-contrast · sagittal · 5.0mm · 0.75mm/px · 2 of 26 slices shown (3 of 3)]
[im 1/26]
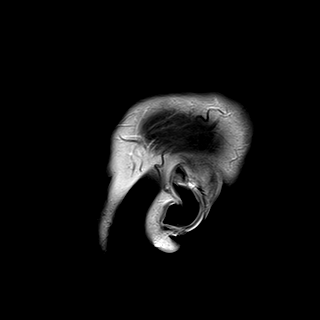
[im 26/26]
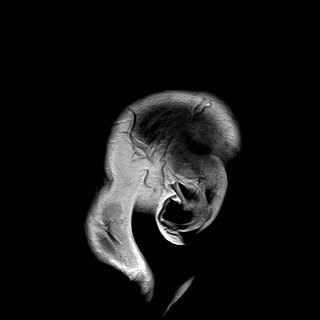

[48 of 48 positions shown; findings below may reference images not displayed]

FINDINGS: Mild intermittent motion degradation.

Brain:

Mild generalized cerebral and cerebellar atrophy.

Sizable focus of chronic encephalomalacia/gliosis within the
anterior left frontal lobe. Associated mild chronic hemosiderin
deposition at this site. Ex vacuo dilatation of the left pleural
ventricle.

Small chronic cortical infarct within the posteromedial right
frontal lobe.

Small chronic cortical/subcortical infarct within the right parietal
lobe.

Background mild multifocal T2 FLAIR hyperintense signal abnormality
within the cerebral white matter, nonspecific but compatible with
chronic small vessel ischemic disease.

There is no acute infarct.

No evidence of an intracranial mass.

No extra-axial fluid collection.

No midline shift.

No pathologic intracranial enhancement identified.

Vascular: Maintained flow voids within the proximal large arterial
vessels.

Skull and upper cervical spine: No focal suspicious marrow lesion.
Apparent left frontal burr hole. Incompletely assessed cervical
spondylosis. Degenerative appearing cyst within the upper cervical
canal.

Sinuses/Orbits: Visualized orbits show no acute finding.
Postsurgical appearance of the paranasal sinuses. Moderate/severe
mucosal thickening within the right frontal sinus. Mild mucosal
thickening within the left frontal sinus. Fairly extensive T2
hyperintense opacification of remaining bilateral ethmoid air cells.
Mild mucosal thickening within the right sphenoid sinus. Complete T2
hyperintense opacification of the left sphenoid sinus. Near complete
T2 hyperintense opacification of the right maxillary sinus. Mild
mucosal thickening within the left maxillary sinus. Additionally,
both maxillary sinuses demonstrate chronic reactive osteitis. Small
mucous retention cyst within the right ostiomeatal unit.

Other: Small bilateral mastoid effusions.
IMPRESSION: 1. Mildly motion degraded examination.
2. No evidence of intracranial metastatic disease.
3. Sizable focus of chronic encephalomalacia/gliosis within the
anterior left frontal lobe.
4. Small chronic cortically-based infarcts within the posteromedial
right frontal lobe and right parietal lobe.
5. Background mild chronic small vessel image changes within the
cerebral white matter.
6. Mild generalized cerebral and cerebellar atrophy.
7. Postsurgical changes to the paranasal sinuses with paranasal
sinus disease, as described.
8. Small bilateral mastoid effusions.

## 2023-01-18 ENCOUNTER — Other Ambulatory Visit: Payer: Self-pay | Admitting: Physician Assistant

## 2023-01-18 DIAGNOSIS — C3432 Malignant neoplasm of lower lobe, left bronchus or lung: Secondary | ICD-10-CM

## 2023-01-18 NOTE — Progress Notes (Unsigned)
[facility] OFFICE PROGRESS NOTE  Shirline Frees, MD [place] [address]  DIAGNOSIS:  Stage IIIb (T4, N2, M0) non-small cell lung cancer, squamous cell carcinoma presented with large left upper lobe lung mass with left hilar and subcarinal lymphadenopathy diagnosed in February 2023.   Molecular study by [employer] DETECTED ALTERATION(S) / BIOMARKER(S)      % CFDNA OR AMPLIFICATION        ASSOCIATED FDA-APPROVED THERAPIES         CLINICAL TRIAL AVAILABILITY [id number] 4.4% None     Yes [id number] 1.1% None     Yes   PDL1 Expression 90%  PRIOR THERAPY:  A course of concurrent chemoradiation with weekly carboplatin for AUC of 2 and paclitaxel 45 Mg/M2.  Status post 6 cycles.  Last dose was given March 04, 2022 with partial response.     CURRENT THERAPY: Consolidation treatment with immunotherapy with Imfinzi 1500 Mg IV every 4 weeks.  First dose Apr 17, 2022.  Status post 10 cycles.    INTERVAL HISTORY: Peter Diaz 72 y.o. male returns  to the clinic today for a follow-up visit accompanied by his wife. The patient is feeling fairly well today without any concerning complaints. Currently, he is undergoing consolidation immunotherapy with Imfinzi IV every 4 weeks and he tolerates it fairly well. The patient has chronic diarrhea for 2 years.  His symptoms are similar to his baseline or perhaps slightly worse since last being seen. He is scheduled to see his gastroenterologist later this month at [facility]. He sometimes takes imodium which helps.  He denies any fever, chills, night sweats.  He is prescribed Remeron and marinol for his decreased appetite. However, the Marinol made him feel "abnormal" and he stopped taking this after 1 dose.  He is still taking Remeron.  He did gain about 3 lbss pounds since last being seen and states his appetite seems to be doing better.   He reports he has baseline dyspnea on exertion.  He denies significant  cough. Denies any hemoptysis or chest pain.  He does not always use his rescue inhaler as often for wheezing. Denies any nausea, vomiting, or constipation.  Denies any rashes or skin changes.  He is here for evaluation and repeat blood work before undergoing cycle #11.   MEDICAL HISTORY: Past Medical History:  Diagnosis Date   GERD (gastroesophageal reflux disease)    Gout    History of radiation therapy    Left lung- (01/28/22-03/08/22)- Dr. Gery Pray   HTN (hypertension)     ALLERGIES:  is allergic to marinol [dronabinol].  MEDICATIONS:  Current Outpatient Medications  Medication Sig Dispense Refill   albuterol (VENTOLIN HFA) 108 (90 Base) MCG/ACT inhaler Inhale 2 puffs into the lungs every 6 (six) hours as needed for wheezing or shortness of breath. 8 g 6   amLODipine (NORVASC) 10 MG tablet Take 10 mg by mouth daily.     esomeprazole (NEXIUM) 20 MG capsule Take 20 mg by mouth daily at 12 noon.     ibuprofen (ADVIL) 200 MG tablet Take 200 mg by mouth as needed.     mirtazapine (REMERON) 15 MG tablet TAKE 1 TABLET(15 MG) BY MOUTH AT BEDTIME 30 tablet 2   tamsulosin (FLOMAX) 0.4 MG CAPS capsule Take 0.4 mg by mouth at bedtime.     No current facility-administered medications for this visit.    SURGICAL HISTORY:  Past Surgical History:  Procedure Laterality Date  BIOPSY  01/11/2022   Procedure: BIOPSY;  Surgeon: Garner Nash, DO;  Location: [facility];  Service: Pulmonary;;   BRAIN SURGERY     BRONCHIAL BRUSHINGS  01/11/2022   Procedure: BRONCHIAL BRUSHINGS;  Surgeon: Garner Nash, DO;  Location: [facility];  Service: Pulmonary;;   BRONCHIAL NEEDLE ASPIRATION BIOPSY  01/11/2022   Procedure: BRONCHIAL NEEDLE ASPIRATION BIOPSIES;  Surgeon: Garner Nash, DO;  Location: [facility];  Service: Pulmonary;;   VIDEO BRONCHOSCOPY WITH ENDOBRONCHIAL ULTRASOUND Left 01/11/2022   Procedure: VIDEO BRONCHOSCOPY WITH ENDOBRONCHIAL ULTRASOUND;  Surgeon: Garner Nash, DO;   Location: [facility];  Service: Pulmonary;  Laterality: Left;    REVIEW OF SYSTEMS:   Review of Systems  Constitutional: Negative for appetite change, chills, fatigue, fever and unexpected weight change.  HENT:  Negative for mouth sores, nosebleeds, sore throat and trouble swallowing.   Eyes: Negative for eye problems and icterus.  Respiratory: Positive for stable dyspnea on exertion. Negative for cough, hemoptysis, and wheezing.   Cardiovascular: Negative for chest pain and leg swelling.  Gastrointestinal: Positive for chronic diarrhea. Negative for abdominal pain, constipation, nausea and vomiting.  Genitourinary: Negative for bladder incontinence, difficulty urinating, dysuria, frequency and hematuria.   Musculoskeletal: Negative for back pain, gait problem, neck pain and neck stiffness.  Skin: Negative for itching and rash.  Neurological: Negative for dizziness, extremity weakness, gait problem, headaches, light-headedness and seizures.  Hematological: Negative for adenopathy. Does not bruise/bleed easily.  Psychiatric/Behavioral: Negative for confusion, depression and sleep disturbance. The patient is not nervous/anxious.     PHYSICAL EXAMINATION:  Blood pressure 121/73, pulse 72, temperature 98 F (36.7 C), temperature source Oral, resp. rate 16, weight 157 lb 1.6 oz (71.3 kg), SpO2 100 %.  ECOG PERFORMANCE STATUS: 1  Physical Exam  Constitutional: Oriented to person, place, and time and thin appearing male and in no distress.  HENT:  Head: Normocephalic and atraumatic.  Mouth/Throat: Oropharynx is clear and moist. No oropharyngeal exudate.  Eyes: Conjunctivae are normal. Right eye exhibits no discharge. Left eye exhibits no discharge. No scleral icterus.  Neck: Normal range of motion. Neck supple.  Cardiovascular: Normal rate, regular rhythm, normal heart sounds and intact distal pulses.   Pulmonary/Chest: Effort normal. Quiet breath sounds bilaterally. Some rhonchi which  cleared with coughing. No respiratory distress. No rales.  Abdominal: Soft. Bowel sounds are normal. Exhibits no distension and no mass. There is no tenderness.  Musculoskeletal: Normal range of motion. Exhibits no edema.  Lymphadenopathy:    No cervical adenopathy.  Neurological: Alert and oriented to person, place, and time. Exhibits normal muscle tone. Gait normal. Coordination normal.  Skin: Skin is warm and dry. No rash noted. Not diaphoretic. No erythema. No pallor.  Psychiatric: Mood, memory and judgment normal.  Vitals reviewed.  LABORATORY DATA: Lab Results  Component Value Date   WBC 8.4 01/22/2023   HGB 13.2 01/22/2023   HCT 42.0 01/22/2023   MCV 84.7 01/22/2023   PLT 303 01/22/2023      Chemistry      Component Value Date/Time   NA 143 01/22/2023 0954   K 3.9 01/22/2023 0954   CL 111 01/22/2023 0954   CO2 26 01/22/2023 0954   BUN 12 01/22/2023 0954   CREATININE 1.03 01/22/2023 0954      Component Value Date/Time   CALCIUM 9.0 01/22/2023 0954   ALKPHOS 116 01/22/2023 0954   AST 8 (L) 01/22/2023 0954   ALT 7 01/22/2023 0954   BILITOT 0.4 01/22/2023 0954  RADIOGRAPHIC STUDIES:  No results found.   ASSESSMENT/PLAN:  This is a very pleasant 72 year old Caucasian male diagnosed with stage IIIb (T4, N2, M0) non-small cell lung cancer, squamous cell carcinoma.  The patient presented with a left upper lobe mass in addition to left hilar and subcarinal lymphadenopathy.  He was diagnosed in February 2023.  The patient has no actionable mutations and his PD-L1 expression is 90%.    The patient completed a course of concurrent chemoradiation with weekly carboplatin for an AUC of 2 and paclitaxel 45 mg per metered squared.  He status post 6 cycles.  His last dose of treatment was given on 03/04/2022.  He had a partial response to treatment.  The patient is currently undergoing consolidation immunotherapy with Imfinzi 1500 mg IV every 4 weeks.  He is status post  10 cycles.   Labs were reviewed. Recommend that he proceed with cycle #11 today as scheduled.   We will see him back for follow-up visit in 4 weeks for evaluation repeat blood work before undergoing cycle #12.   He will continue Remeron for his decreased appetite. Discussed if ever needed, he can take 30 mg of Remeron. However, it seems like his appetite has improved. He did not like the way Marinol made him feel.   He is scheduled to see his GI provider later this month for chronic diarrhea. We reviewed instructions for imodium if needed to prevent dehydration and electrolyte abnormalities.   I also reminded him of his albuterol if needed for wheezing.   The patient was advised to call immediately if she has any concerning symptoms in the interval. The patient voices understanding of current disease status and treatment options and is in agreement with the current care plan. All questions were answered. The patient knows to call the clinic with any problems, questions or concerns. We can certainly see the patient much sooner if necessary        No orders of the defined types were placed in this encounter.   The total time spent in the appointment was 20-29 minutes.   Jayse Hodkinson L Tion Tse, PA-C 01/22/23

## 2023-01-21 ENCOUNTER — Other Ambulatory Visit: Payer: Self-pay

## 2023-01-22 ENCOUNTER — Inpatient Hospital Stay: Payer: PPO

## 2023-01-22 ENCOUNTER — Other Ambulatory Visit: Payer: Self-pay

## 2023-01-22 ENCOUNTER — Inpatient Hospital Stay: Payer: PPO | Attending: Radiation Oncology

## 2023-01-22 ENCOUNTER — Inpatient Hospital Stay (HOSPITAL_BASED_OUTPATIENT_CLINIC_OR_DEPARTMENT_OTHER): Payer: PPO | Admitting: Physician Assistant

## 2023-01-22 DIAGNOSIS — Z5112 Encounter for antineoplastic immunotherapy: Secondary | ICD-10-CM | POA: Insufficient documentation

## 2023-01-22 DIAGNOSIS — Z79899 Other long term (current) drug therapy: Secondary | ICD-10-CM | POA: Diagnosis not present

## 2023-01-22 DIAGNOSIS — C3412 Malignant neoplasm of upper lobe, left bronchus or lung: Secondary | ICD-10-CM | POA: Diagnosis not present

## 2023-01-22 DIAGNOSIS — C3432 Malignant neoplasm of lower lobe, left bronchus or lung: Secondary | ICD-10-CM

## 2023-01-22 DIAGNOSIS — Z923 Personal history of irradiation: Secondary | ICD-10-CM | POA: Insufficient documentation

## 2023-01-22 DIAGNOSIS — Z9221 Personal history of antineoplastic chemotherapy: Secondary | ICD-10-CM | POA: Insufficient documentation

## 2023-01-22 LAB — CMP (CANCER CENTER ONLY)
ALT: 7 U/L (ref 0–44)
AST: 8 U/L — ABNORMAL LOW (ref 15–41)
Albumin: 3.8 g/dL (ref 3.5–5.0)
Alkaline Phosphatase: 116 U/L (ref 38–126)
Anion gap: 6 (ref 5–15)
BUN: 12 mg/dL (ref 8–23)
CO2: 26 mmol/L (ref 22–32)
Calcium: 9 mg/dL (ref 8.9–10.3)
Chloride: 111 mmol/L (ref 98–111)
Creatinine: 1.03 mg/dL (ref 0.61–1.24)
GFR, Estimated: >60 mL/min (ref >60)
Glucose, Bld: 87 mg/dL (ref 70–99)
Potassium: 3.9 mmol/L (ref 3.5–5.1)
Sodium: 143 mmol/L (ref 135–145)
Total Bilirubin: 0.4 mg/dL (ref 0.3–1.2)
Total Protein: 7.1 g/dL (ref 6.5–8.1)

## 2023-01-22 LAB — CBC WITH DIFFERENTIAL (CANCER CENTER ONLY)
Abs Immature Granulocytes: 0.05 10*3/uL (ref 0.00–0.07)
Basophils Absolute: 0.1 10*3/uL (ref 0.0–0.1)
Basophils Relative: 1 %
Eosinophils Absolute: 0.2 10*3/uL (ref 0.0–0.5)
Eosinophils Relative: 2 %
HCT: 42 % (ref 39.0–52.0)
Hemoglobin: 13.2 g/dL (ref 13.0–17.0)
Immature Granulocytes: 1 %
Lymphocytes Relative: 10 %
Lymphs Abs: 0.8 10*3/uL (ref 0.7–4.0)
MCH: 26.6 pg (ref 26.0–34.0)
MCHC: 31.4 g/dL (ref 30.0–36.0)
MCV: 84.7 fL (ref 80.0–100.0)
Monocytes Absolute: 0.9 10*3/uL (ref 0.1–1.0)
Monocytes Relative: 10 %
Neutro Abs: 6.5 10*3/uL (ref 1.7–7.7)
Neutrophils Relative %: 76 %
Platelet Count: 303 10*3/uL (ref 150–400)
RBC: 4.96 MIL/uL (ref 4.22–5.81)
RDW: 16.5 % — ABNORMAL HIGH (ref 11.5–15.5)
WBC Count: 8.4 10*3/uL (ref 4.0–10.5)
nRBC: 0 % (ref 0.0–0.2)

## 2023-01-22 LAB — TSH: TSH: 1.525 u[IU]/mL (ref 0.350–4.500)

## 2023-01-22 MED ORDER — SODIUM CHLORIDE 0.9 % IV SOLN: Freq: Once | INTRAVENOUS | Status: AC

## 2023-01-22 MED ORDER — SODIUM CHLORIDE 0.9 % IV SOLN
1500.0000 mg | Freq: Once | INTRAVENOUS | Status: AC
  Administered 2023-01-22: 1500 mg via INTRAVENOUS
  Filled 2023-01-22: qty 30

## 2023-01-22 NOTE — Patient Instructions (Signed)
[facility]  Discharge Instructions: Thank you for choosing [facility] to provide your oncology and hematology care.   If you have a lab appointment with the [facility], please go directly to the [facility] and check in at the registration area.   Wear comfortable clothing and clothing appropriate for easy access to any Portacath or PICC line.   We strive to give you quality time with your provider. You may need to reschedule your appointment if you arrive late (15 or more minutes).  Arriving late affects you and other patients whose appointments are after yours.  Also, if you miss three or more appointments without notifying the office, you may be dismissed from the clinic at the provider's discretion.      For prescription refill requests, have your pharmacy contact our office and allow 72 hours for refills to be completed.    Today you received the following chemotherapy and/or immunotherapy agents: Durvalumab      To help prevent nausea and vomiting after your treatment, we encourage you to take your nausea medication as directed.  BELOW ARE SYMPTOMS THAT SHOULD BE REPORTED IMMEDIATELY: *FEVER GREATER THAN 100.4 F (38 C) OR HIGHER *CHILLS OR SWEATING *NAUSEA AND VOMITING THAT IS NOT CONTROLLED WITH YOUR NAUSEA MEDICATION *UNUSUAL SHORTNESS OF BREATH *UNUSUAL BRUISING OR BLEEDING *URINARY PROBLEMS (pain or burning when urinating, or frequent urination) *BOWEL PROBLEMS (unusual diarrhea, constipation, pain near the anus) TENDERNESS IN MOUTH AND THROAT WITH OR WITHOUT PRESENCE OF ULCERS (sore throat, sores in mouth, or a toothache) UNUSUAL RASH, SWELLING OR PAIN  UNUSUAL VAGINAL DISCHARGE OR ITCHING   Items with * indicate a potential emergency and should be followed up as soon as possible or go to the Emergency Department if any problems should occur.  Please show the CHEMOTHERAPY ALERT CARD or IMMUNOTHERAPY ALERT CARD at  check-in to the Emergency Department and triage nurse.  Should you have questions after your visit or need to cancel or reschedule your appointment, please contact [facility]  Dept: [phone]  and follow the prompts.  Office hours are 8:00 a.m. to 4:30 p.m. Monday - Friday. Please note that voicemails left after 4:00 p.m. may not be returned until the following business day.  We are closed weekends and major holidays. You have access to a nurse at all times for urgent questions. Please call the main number to the clinic Dept: [phone] and follow the prompts.   For any non-urgent questions, you may also contact your provider using MyChart. We now offer e-Visits for anyone 80 and older to request care online for non-urgent symptoms. For details visit [email or web address].   Also download the MyChart app! Go to the app store, search "MyChart", open the app, select [facility], and log in with your MyChart username and password.

## 2023-01-22 NOTE — Progress Notes (Signed)
Patient seen by PA today  Vitals are within treatment parameters.  Labs reviewed: and are within treatment parameters.  Per physician team, patient is ready for treatment and there are NO modifications to the treatment plan.

## 2023-01-23 ENCOUNTER — Other Ambulatory Visit: Payer: Self-pay

## 2023-01-27 ENCOUNTER — Other Ambulatory Visit: Payer: Self-pay | Admitting: Acute Care

## 2023-01-27 DIAGNOSIS — R059 Cough, unspecified: Secondary | ICD-10-CM

## 2023-01-27 DIAGNOSIS — K219 Gastro-esophageal reflux disease without esophagitis: Secondary | ICD-10-CM | POA: Diagnosis not present

## 2023-01-27 DIAGNOSIS — J439 Emphysema, unspecified: Secondary | ICD-10-CM | POA: Diagnosis not present

## 2023-01-27 DIAGNOSIS — I10 Essential (primary) hypertension: Secondary | ICD-10-CM | POA: Diagnosis not present

## 2023-01-27 DIAGNOSIS — N401 Enlarged prostate with lower urinary tract symptoms: Secondary | ICD-10-CM | POA: Diagnosis not present

## 2023-01-27 DIAGNOSIS — R0600 Dyspnea, unspecified: Secondary | ICD-10-CM

## 2023-01-29 NOTE — Telephone Encounter (Signed)
Per chart, Ok to refill albuterol MDI OV 07/25/2022.

## 2023-02-06 DIAGNOSIS — C3492 Malignant neoplasm of unspecified part of left bronchus or lung: Secondary | ICD-10-CM | POA: Diagnosis not present

## 2023-02-06 DIAGNOSIS — K52831 Collagenous colitis: Secondary | ICD-10-CM | POA: Diagnosis not present

## 2023-02-06 DIAGNOSIS — J439 Emphysema, unspecified: Secondary | ICD-10-CM | POA: Diagnosis not present

## 2023-02-06 DIAGNOSIS — Z86010 Personal history of colonic polyps: Secondary | ICD-10-CM | POA: Diagnosis not present

## 2023-02-11 DIAGNOSIS — L309 Dermatitis, unspecified: Secondary | ICD-10-CM | POA: Diagnosis not present

## 2023-02-11 DIAGNOSIS — C3490 Malignant neoplasm of unspecified part of unspecified bronchus or lung: Secondary | ICD-10-CM | POA: Diagnosis not present

## 2023-02-19 ENCOUNTER — Inpatient Hospital Stay: Payer: PPO

## 2023-02-19 ENCOUNTER — Other Ambulatory Visit: Payer: Self-pay

## 2023-02-19 ENCOUNTER — Encounter: Payer: Self-pay | Admitting: Internal Medicine

## 2023-02-19 ENCOUNTER — Encounter: Payer: Self-pay | Admitting: Medical Oncology

## 2023-02-19 ENCOUNTER — Inpatient Hospital Stay: Payer: PPO | Attending: Radiation Oncology | Admitting: Internal Medicine

## 2023-02-19 DIAGNOSIS — C3432 Malignant neoplasm of lower lobe, left bronchus or lung: Secondary | ICD-10-CM

## 2023-02-19 DIAGNOSIS — Z79899 Other long term (current) drug therapy: Secondary | ICD-10-CM | POA: Insufficient documentation

## 2023-02-19 DIAGNOSIS — Z923 Personal history of irradiation: Secondary | ICD-10-CM | POA: Insufficient documentation

## 2023-02-19 DIAGNOSIS — C3412 Malignant neoplasm of upper lobe, left bronchus or lung: Secondary | ICD-10-CM | POA: Diagnosis not present

## 2023-02-19 DIAGNOSIS — Z5112 Encounter for antineoplastic immunotherapy: Secondary | ICD-10-CM | POA: Insufficient documentation

## 2023-02-19 DIAGNOSIS — D72829 Elevated white blood cell count, unspecified: Secondary | ICD-10-CM | POA: Diagnosis not present

## 2023-02-19 LAB — CBC WITH DIFFERENTIAL (CANCER CENTER ONLY)
Abs Immature Granulocytes: 0.17 10*3/uL — ABNORMAL HIGH (ref 0.00–0.07)
Basophils Absolute: 0.1 10*3/uL (ref 0.0–0.1)
Basophils Relative: 0 %
Eosinophils Absolute: 0 10*3/uL (ref 0.0–0.5)
Eosinophils Relative: 0 %
HCT: 41.2 % (ref 39.0–52.0)
Hemoglobin: 13.1 g/dL (ref 13.0–17.0)
Immature Granulocytes: 1 %
Lymphocytes Relative: 4 %
Lymphs Abs: 0.6 10*3/uL — ABNORMAL LOW (ref 0.7–4.0)
MCH: 27.2 pg (ref 26.0–34.0)
MCHC: 31.8 g/dL (ref 30.0–36.0)
MCV: 85.5 fL (ref 80.0–100.0)
Monocytes Absolute: 0.5 10*3/uL (ref 0.1–1.0)
Monocytes Relative: 3 %
Neutro Abs: 14 10*3/uL — ABNORMAL HIGH (ref 1.7–7.7)
Neutrophils Relative %: 92 %
Platelet Count: 314 10*3/uL (ref 150–400)
RBC: 4.82 MIL/uL (ref 4.22–5.81)
RDW: 16.5 % — ABNORMAL HIGH (ref 11.5–15.5)
WBC Count: 15.3 10*3/uL — ABNORMAL HIGH (ref 4.0–10.5)
nRBC: 0 % (ref 0.0–0.2)

## 2023-02-19 LAB — CMP (CANCER CENTER ONLY)
ALT: 13 U/L (ref 0–44)
AST: 11 U/L — ABNORMAL LOW (ref 15–41)
Albumin: 3.8 g/dL (ref 3.5–5.0)
Alkaline Phosphatase: 99 U/L (ref 38–126)
Anion gap: 6 (ref 5–15)
BUN: 21 mg/dL (ref 8–23)
CO2: 27 mmol/L (ref 22–32)
Calcium: 8.6 mg/dL — ABNORMAL LOW (ref 8.9–10.3)
Chloride: 110 mmol/L (ref 98–111)
Creatinine: 1.15 mg/dL (ref 0.61–1.24)
GFR, Estimated: >60 mL/min (ref >60)
Glucose, Bld: 99 mg/dL (ref 70–99)
Potassium: 3.8 mmol/L (ref 3.5–5.1)
Sodium: 143 mmol/L (ref 135–145)
Total Bilirubin: 0.4 mg/dL (ref 0.3–1.2)
Total Protein: 7 g/dL (ref 6.5–8.1)

## 2023-02-19 MED ORDER — SODIUM CHLORIDE 0.9 % IV SOLN
1500.0000 mg | Freq: Once | INTRAVENOUS | Status: AC
  Administered 2023-02-19: 1500 mg via INTRAVENOUS
  Filled 2023-02-19: qty 30

## 2023-02-19 MED ORDER — SODIUM CHLORIDE 0.9 % IV SOLN: Freq: Once | INTRAVENOUS | Status: AC

## 2023-02-19 NOTE — Progress Notes (Signed)
[facility] [phone]   Fax:([phone]  OFFICE PROGRESS NOTE  Shirline Frees, MD [place] [address]  DIAGNOSIS: Stage IIIb (T4, N2, M0) non-small cell lung cancer, squamous cell carcinoma presented with large left upper lobe lung mass with left hilar and subcarinal lymphadenopathy diagnosed in February 2023.  Molecular study by [employer] 360 DETECTED ALTERATION(S) / BIOMARKER(S) % CFDNA OR AMPLIFICATION ASSOCIATED FDA-APPROVED THERAPIES CLINICAL TRIAL AVAILABILITY [id number] 4.4% None  Yes [id number] 1.1% None  Yes  PDL1 Expression 90%  PRIOR THERAPY:A course of concurrent chemoradiation with weekly carboplatin for AUC of 2 and paclitaxel 45 Mg/M2.  Status post 6 cycles.  Last dose was given March 04, 2022 with partial response.  CURRENT THERAPY: Consolidation treatment with immunotherapy with Imfinzi 1500 Mg IV every 4 weeks.  First dose Apr 17, 2022.  Status post 11 cycles.  INTERVAL HISTORY: Peter Diaz 72 y.o. male returns to the clinic today for follow-up visit accompanied by his grandson Tomasita Crumble.  The patient is feeling fine today with no concerning complaints.  He was on a tapered dose of prednisone for a skin rash and currently on 15 mg p.o. daily.  He denied having any current chest pain, shortness of breath, cough or hemoptysis.  He has no nausea, vomiting, diarrhea or constipation.  He has no headache or visual changes.  He is here today for evaluation before starting cycle #12 of his treatment.   MEDICAL HISTORY: Past Medical History:  Diagnosis Date   GERD (gastroesophageal reflux disease)    Gout    History of radiation therapy    Left lung- (01/28/22-03/08/22)- Dr. Gery Pray   HTN (hypertension)     ALLERGIES:  is allergic to marinol [dronabinol].  MEDICATIONS:  Current Outpatient Medications  Medication Sig Dispense Refill   albuterol (VENTOLIN HFA) 108 (90 Base) MCG/ACT inhaler  INHALE 2 PUFFS INTO THE LUNGS EVERY 6 HOURS AS NEEDED FOR WHEEZING OR SHORTNESS OF BREATH 6.7 g 5   amLODipine (NORVASC) 10 MG tablet Take 10 mg by mouth daily.     esomeprazole (NEXIUM) 20 MG capsule Take 20 mg by mouth daily at 12 noon.     ibuprofen (ADVIL) 200 MG tablet Take 200 mg by mouth as needed.     mirtazapine (REMERON) 15 MG tablet TAKE 1 TABLET(15 MG) BY MOUTH AT BEDTIME 30 tablet 2   tamsulosin (FLOMAX) 0.4 MG CAPS capsule Take 0.4 mg by mouth at bedtime.     No current facility-administered medications for this visit.    SURGICAL HISTORY:  Past Surgical History:  Procedure Laterality Date   BIOPSY  01/11/2022   Procedure: BIOPSY;  Surgeon: Garner Nash, DO;  Location: [facility];  Service: Pulmonary;;   BRAIN SURGERY     BRONCHIAL BRUSHINGS  01/11/2022   Procedure: BRONCHIAL BRUSHINGS;  Surgeon: Garner Nash, DO;  Location: [facility];  Service: Pulmonary;;   BRONCHIAL NEEDLE ASPIRATION BIOPSY  01/11/2022   Procedure: BRONCHIAL NEEDLE ASPIRATION BIOPSIES;  Surgeon: Garner Nash, DO;  Location: [facility];  Service: Pulmonary;;   VIDEO BRONCHOSCOPY WITH ENDOBRONCHIAL ULTRASOUND Left 01/11/2022   Procedure: VIDEO BRONCHOSCOPY WITH ENDOBRONCHIAL ULTRASOUND;  Surgeon: Garner Nash, DO;  Location: [facility];  Service: Pulmonary;  Laterality: Left;    REVIEW OF SYSTEMS:  A comprehensive review of systems was negative except for: Integument/breast: positive for rash   PHYSICAL EXAMINATION: General appearance: alert, cooperative, and no distress Head: Normocephalic,  without obvious abnormality, atraumatic Neck: no adenopathy, no JVD, supple, symmetrical, trachea midline, and thyroid not enlarged, symmetric, no tenderness/mass/nodules Lymph nodes: Cervical, supraclavicular, and axillary nodes normal. Resp: clear to auscultation bilaterally Back: symmetric, no curvature. ROM normal. No CVA tenderness. Cardio: regular rate and rhythm, S1, S2 normal, no murmur,  click, rub or gallop GI: soft, non-tender; bowel sounds normal; no masses,  no organomegaly Extremities: extremities normal, atraumatic, no cyanosis or edema  ECOG PERFORMANCE STATUS: 1 - Symptomatic but completely ambulatory  Blood pressure (!) 144/85, pulse 93, temperature 97.8 F (36.6 C), temperature source Oral, resp. rate 18, weight 166 lb 7 oz (75.5 kg), SpO2 99 %.  LABORATORY DATA: Lab Results  Component Value Date   WBC 15.3 (H) 02/19/2023   HGB 13.1 02/19/2023   HCT 41.2 02/19/2023   MCV 85.5 02/19/2023   PLT 314 02/19/2023      Chemistry      Component Value Date/Time   NA 143 01/22/2023 0954   K 3.9 01/22/2023 0954   CL 111 01/22/2023 0954   CO2 26 01/22/2023 0954   BUN 12 01/22/2023 0954   CREATININE 1.03 01/22/2023 0954      Component Value Date/Time   CALCIUM 9.0 01/22/2023 0954   ALKPHOS 116 01/22/2023 0954   AST 8 (L) 01/22/2023 0954   ALT 7 01/22/2023 0954   BILITOT 0.4 01/22/2023 0954       RADIOGRAPHIC STUDIES: No results found.  ASSESSMENT AND PLAN: This is a very pleasant 72 years old white male diagnosed with stage IIIb (T4, N2, M0) non-small cell lung cancer, squamous cell carcinoma presented with large left upper lobe lung mass in addition to left hilar and subcarinal lymphadenopathy diagnosed in February 2023.  The patient has no actionable mutation and PD-L1 expression is 90%. He had MRI of the brain performed recently that showed no concerning findings for disease metastasis to the brain. The patient completed a course of concurrent chemoradiation with weekly carboplatin for AUC of 2 and paclitaxel 45 Mg/M2 status post 6 week of treatment.  The last dose of his treatment was given on March 04, 2022 with partial response.  He tolerated this treatment well except for radiation-induced esophagitis with odynophagia. He is currently undergoing consolidation treatment with Imfinzi 1500 Mg IV every 4 weeks, status post 11 cycles. The patient has been  tolerating this treatment well with no concerning adverse effect except for mild skin rash treated with prednisone recently. I recommended for him to proceed with cycle #12 today as planned. His leukocytosis is secondary to the treatment with prednisone and will continue to monitor. The patient will come back for follow-up visit in 4 weeks for evaluation before the last cycle of his treatment. He was advised to call immediately if he has any concerning symptoms in the interval. The patient voices understanding of current disease status and treatment options and is in agreement with the current care plan.  All questions were answered. The patient knows to call the clinic with any problems, questions or concerns. We can certainly see the patient much sooner if necessary.  The total time spent in the appointment was 20 minutes.  Disclaimer: This note was dictated with voice recognition software. Similar sounding words can inadvertently be transcribed and may not be corrected upon review.

## 2023-02-19 NOTE — Progress Notes (Signed)
Patient seen by MD today  Vitals are within treatment parameters.  Labs reviewed: and are within treatment parameters.  Per physician team, patient is ready for treatment and there are NO modifications to the treatment plan.  

## 2023-02-19 NOTE — Patient Instructions (Signed)
[facility]  Discharge Instructions: Thank you for choosing [facility] to provide your oncology and hematology care.   If you have a lab appointment with the Cancer Center, please go directly to the Cancer Center and check in at the registration area.   Wear comfortable clothing and clothing appropriate for easy access to any Portacath or PICC line.   We strive to give you quality time with your provider. You may need to reschedule your appointment if you arrive late (15 or more minutes).  Arriving late affects you and other patients whose appointments are after yours.  Also, if you miss three or more appointments without notifying the office, you may be dismissed from the clinic at the provider's discretion.      For prescription refill requests, have your pharmacy contact our office and allow 72 hours for refills to be completed.    Today you received the following chemotherapy and/or immunotherapy agents: Durvalumab      To help prevent nausea and vomiting after your treatment, we encourage you to take your nausea medication as directed.  BELOW ARE SYMPTOMS THAT SHOULD BE REPORTED IMMEDIATELY: *FEVER GREATER THAN 100.4 F (38 C) OR HIGHER *CHILLS OR SWEATING *NAUSEA AND VOMITING THAT IS NOT CONTROLLED WITH YOUR NAUSEA MEDICATION *UNUSUAL SHORTNESS OF BREATH *UNUSUAL BRUISING OR BLEEDING *URINARY PROBLEMS (pain or burning when urinating, or frequent urination) *BOWEL PROBLEMS (unusual diarrhea, constipation, pain near the anus) TENDERNESS IN MOUTH AND THROAT WITH OR WITHOUT PRESENCE OF ULCERS (sore throat, sores in mouth, or a toothache) UNUSUAL RASH, SWELLING OR PAIN  UNUSUAL VAGINAL DISCHARGE OR ITCHING   Items with * indicate a potential emergency and should be followed up as soon as possible or go to the Emergency Department if any problems should occur.  Please show the CHEMOTHERAPY ALERT CARD or IMMUNOTHERAPY ALERT CARD at  check-in to the Emergency Department and triage nurse.  Should you have questions after your visit or need to cancel or reschedule your appointment, please contact [facility]  Dept: [phone]  and follow the prompts.  Office hours are 8:00 a.m. to 4:30 p.m. Monday - Friday. Please note that voicemails left after 4:00 p.m. may not be returned until the following business day.  We are closed weekends and major holidays. You have access to a nurse at all times for urgent questions. Please call the main number to the clinic Dept: [phone] and follow the prompts.   For any non-urgent questions, you may also contact your provider using MyChart. We now offer e-Visits for anyone 18 and older to request care online for non-urgent symptoms. For details visit [email or web address].   Also download the MyChart app! Go to the app store, search "MyChart", open the app, select [facility], and log in with your MyChart username and password.   

## 2023-02-21 LAB — T4: T4, Total: 6.8 ug/dL (ref 4.5–12.0)

## 2023-02-25 ENCOUNTER — Encounter: Payer: Self-pay | Admitting: Internal Medicine

## 2023-02-26 ENCOUNTER — Encounter: Payer: Self-pay | Admitting: Internal Medicine

## 2023-03-07 DIAGNOSIS — L989 Disorder of the skin and subcutaneous tissue, unspecified: Secondary | ICD-10-CM | POA: Diagnosis not present

## 2023-03-11 ENCOUNTER — Other Ambulatory Visit: Payer: Self-pay

## 2023-03-13 NOTE — Progress Notes (Signed)
[facility] OFFICE PROGRESS NOTE  Peter Blamer, MD [address] [place] [address]  DIAGNOSIS: Stage IIIb (T4, N2, M0) non-small cell lung cancer, squamous cell carcinoma presented with large left upper lobe lung mass with left hilar and subcarinal lymphadenopathy diagnosed in February 2023.   Molecular study by [employer] 360 DETECTED ALTERATION(S) / BIOMARKER(S)      % CFDNA OR AMPLIFICATION        ASSOCIATED FDA-APPROVED THERAPIES         CLINICAL TRIAL AVAILABILITY [id number] 4.4% None     Yes [id number] 1.1% None     Yes   PDL1 Expression 90%  PRIOR THERAPY: A course of concurrent chemoradiation with weekly carboplatin for AUC of 2 and paclitaxel 45 Mg/M2.  Status post 6 cycles.  Last dose was given March 04, 2022 with partial response.     CURRENT THERAPY: Consolidation treatment with immunotherapy with Imfinzi 1500 Mg IV every 4 weeks.  First dose Apr 17, 2022.  Status post 12 cycles.    INTERVAL HISTORY: Peter Diaz 72 y.o. male returns to the clinic today for a follow-up visit accompanied by his wife. The patient is experiencing a significant rash on his hands, arms, abdomen, back and buttock. This appears consistent with his immunotherapy mediated rash. He has seen dermatology in the interval.  He was given steroids p.o. and steroid injection.  He also has cream but he is not sure the name of the cream and if it is steroid cream.    Currently, he is undergoing consolidation immunotherapy with Imfinzi IV every 4 weeks and he previously had been tolerating well. He denies any fever, chills, night sweats.  He is prescribed Remeron and marinol for his decreased appetite. However, the Marinol made him feel "abnormal" and he stopped taking this after 1 dose. He reports he has baseline dyspnea on exertion.  He denies significant cough. Denies any hemoptysis or chest pain.  Denies any nausea, vomiting, or constipation.  he denies changes with his  intermittent baseline diarrhea at this time.  He is here for evaluation and repeat blood work before undergoing cycle #13, which is his last cycle of treatment.     MEDICAL HISTORY: Past Medical History:  Diagnosis Date   GERD (gastroesophageal reflux disease)    Gout    History of radiation therapy    Left lung- (01/28/22-03/08/22)- Dr. Antony Blackbird   HTN (hypertension)     ALLERGIES:  is allergic to marinol [dronabinol].  MEDICATIONS:  Current Outpatient Medications  Medication Sig Dispense Refill   albuterol (VENTOLIN HFA) 108 (90 Base) MCG/ACT inhaler INHALE 2 PUFFS INTO THE LUNGS EVERY 6 HOURS AS NEEDED FOR WHEEZING OR SHORTNESS OF BREATH 6.7 g 5   amLODipine (NORVASC) 10 MG tablet Take 10 mg by mouth daily.     esomeprazole (NEXIUM) 20 MG capsule Take 20 mg by mouth daily at 12 noon.     ibuprofen (ADVIL) 200 MG tablet Take 200 mg by mouth as needed.     mirtazapine (REMERON) 15 MG tablet TAKE 1 TABLET(15 MG) BY MOUTH AT BEDTIME 30 tablet 2   predniSONE (DELTASONE) 10 MG tablet Take 7 tablets (70) mg daily for 2 weeks, then take 5 tablets (50) mg daily for 1 week, followed by 3 tablets (30 mg) daily for 1 week, and then take 1 tablet daily for 7 days. Then stop 161 tablet 0   tamsulosin (FLOMAX) 0.4 MG CAPS capsule Take 0.4 mg by  mouth at bedtime.     No current facility-administered medications for this visit.    SURGICAL HISTORY:  Past Surgical History:  Procedure Laterality Date   BIOPSY  01/11/2022   Procedure: BIOPSY;  Surgeon: Josephine Igo, DO;  Location: [facility];  Service: Pulmonary;;   BRAIN SURGERY     BRONCHIAL BRUSHINGS  01/11/2022   Procedure: BRONCHIAL BRUSHINGS;  Surgeon: Josephine Igo, DO;  Location: [facility];  Service: Pulmonary;;   BRONCHIAL NEEDLE ASPIRATION BIOPSY  01/11/2022   Procedure: BRONCHIAL NEEDLE ASPIRATION BIOPSIES;  Surgeon: Josephine Igo, DO;  Location: [facility];  Service: Pulmonary;;   VIDEO BRONCHOSCOPY WITH ENDOBRONCHIAL  ULTRASOUND Left 01/11/2022   Procedure: VIDEO BRONCHOSCOPY WITH ENDOBRONCHIAL ULTRASOUND;  Surgeon: Josephine Igo, DO;  Location: [facility];  Service: Pulmonary;  Laterality: Left;    REVIEW OF SYSTEMS:   Constitutional: Negative for appetite change, chills, fatigue, fever and unexpected weight change.  HENT:  Negative for mouth sores, nosebleeds, sore throat and trouble swallowing.   Eyes: Negative for eye problems and icterus.  Respiratory: Positive for stable dyspnea on exertion. Negative for cough, hemoptysis, and wheezing.   Cardiovascular: Negative for chest pain and leg swelling.  Gastrointestinal: Negative for abdominal pain, constipation, nausea and vomiting.  Genitourinary: Negative for bladder incontinence, difficulty urinating, dysuria, frequency and hematuria.   Musculoskeletal: Negative for back pain, gait problem, neck pain and neck stiffness.  Skin: Positive for significant rash on trunk and extremities.   Neurological: Negative for dizziness, extremity weakness, gait problem, headaches, light-headedness and seizures.  Hematological: Negative for adenopathy. Does not bruise/bleed easily.  Psychiatric/Behavioral: Negative for confusion, depression and sleep disturbance. The patient is not nervous/anxious.      PHYSICAL EXAMINATION:  Blood pressure (!) 125/59, pulse 89, temperature (!) 97 F (36.1 C), resp. rate 18, weight 166 lb 6.4 oz (75.5 kg), SpO2 95 %.  ECOG PERFORMANCE STATUS: 1  Physical Exam  Constitutional: Oriented to person, place, and time and thin appearing male and in no distress.  HENT:  Head: Normocephalic and atraumatic.  Mouth/Throat: Oropharynx is clear and moist. No oropharyngeal exudate.  Eyes: Conjunctivae are normal. Right eye exhibits no discharge. Left eye exhibits no discharge. No scleral icterus.  Neck: Normal range of motion. Neck supple.  Cardiovascular: Normal rate, regular rhythm, normal heart sounds and intact distal pulses.    Pulmonary/Chest: Effort normal. Quiet breath sounds bilaterally. Some rhonchi which cleared with coughing. No respiratory distress. No rales.  Abdominal: Soft. Bowel sounds are normal. Exhibits no distension and no mass. There is no tenderness.  Musculoskeletal: Normal range of motion. Exhibits no edema.  Lymphadenopathy:    No cervical adenopathy.  Neurological: Alert and oriented to person, place, and time. Exhibits normal muscle tone. Gait normal. Coordination normal.  Skin: Significant rash on back, abdomen, hands, and arms. Likely immunotherapy mediated.       Psychiatric: Mood, memory and judgment normal.  Vitals reviewed.  LABORATORY DATA: Lab Results  Component Value Date   WBC 12.8 (H) 03/19/2023   HGB 13.7 03/19/2023   HCT 43.9 03/19/2023   MCV 85.6 03/19/2023   PLT 359 03/19/2023      Chemistry      Component Value Date/Time   NA 142 03/19/2023 1051   K 3.8 03/19/2023 1051   CL 108 03/19/2023 1051   CO2 26 03/19/2023 1051   BUN 10 03/19/2023 1051   CREATININE 1.38 (H) 03/19/2023 1051      Component Value Date/Time  CALCIUM 9.1 03/19/2023 1051   ALKPHOS 122 03/19/2023 1051   AST 10 (L) 03/19/2023 1051   ALT 6 03/19/2023 1051   BILITOT 0.5 03/19/2023 1051       RADIOGRAPHIC STUDIES:  No results found.   ASSESSMENT/PLAN:  This is a very pleasant 72 year old Caucasian male diagnosed with stage IIIb (T4, N2, M0) non-small cell lung cancer, squamous cell carcinoma.  The patient presented with a left upper lobe mass in addition to left hilar and subcarinal lymphadenopathy.  He was diagnosed in February 2023.  The patient has no actionable mutations and his PD-L1 expression is 90%.    The patient completed a course of concurrent chemoradiation with weekly carboplatin for an AUC of 2 and paclitaxel 45 mg per metered squared.  He status post 6 cycles.  His last dose of treatment was given on 03/04/2022.  He had a partial response to treatment.  The patient  is currently undergoing consolidation immunotherapy with Imfinzi 1500 mg IV every 4 weeks.  He is status post 12 cycles.    Labs were reviewed.  The patient's rash appears to be consistent with immunotherapy mediated.  The patient was therefore seen by Dr. Arbutus PedMohamed.  Dr. Arbutus PedMohamed is in agreement to discontinue his immunotherapy.  This would have been his last cycle of treatment either way.   For the rash, we will put the patient on a high-dose steroid taper.  He will take 7 tablets p.o. for 2 weeks followed by 5 tablets p.o. daily for 1 week followed by 3 tablets p.o. daily for 1 week followed by 1 tablet p.o. daily for 1 week.  We discussed the adverse side effects of steroids including gastritis, agitation, flushing, elevated blood sugar, and insomnia.  The patient is currently taking a PPI daily.  I will arrange for restaging CT scan of the chest prior to his next appointment in 5 weeks after he completes the taper.   We will see him back at that time to review his scan results and discuss the next steps in his care based on his scan results.  He was scheduled to see dermatology next month on 04/28/2023.  The patient will send me a MyChart message later with the cream that he has at home.  If he does not already have a steroid cream, I be happy to send him a prescription to the pharmacy.  He was also instructed to take anti histamine.   The patient was advised to call immediately if he has any concerning symptoms in the interval. The patient voices understanding of current disease status and treatment options and is in agreement with the current care plan. All questions were answered. The patient knows to call the clinic with any problems, questions or concerns. We can certainly see the patient much sooner if necessary     Orders Placed This Encounter  Procedures   CT Chest W Contrast    Standing Status:   Future    Standing Expiration Date:   03/18/2024    Order Specific Question:   If  indicated for the ordered procedure, I authorize the administration of contrast media per Radiology protocol    Answer:   Yes    Order Specific Question:   Does the patient have a contrast media/X-ray dye allergy?    Answer:   No    Order Specific Question:   Preferred [facility]?    [facility]      Jalei Shibley L Deshaun Schou, PA-C  03/19/23  ADDENDUM: Hematology/Oncology Attending: I had a face-to-face encounter with the patient today.  I reviewed his record, lab and recommended his care plan.  This is a very pleasant 72 years old white male with a stage IIIb non-small cell lung cancer, squamous cell carcinoma with no actionable mutation and PD-L1 expression of 90%.  The patient is status post a course of concurrent chemoradiation with weekly carboplatin and paclitaxel with partial response.  He started consolidation treatment with immunotherapy with Imfinzi 1500 Mg IV every 4 weeks status post 12 cycles.  He has been tolerating this treatment well but today he presented with extensive rash on the face, arms, chest, abdomen and back.  This is likely immunotherapy mediated skin rash. I recommended for the patient to discontinue his current treatment with immunotherapy at this point. I will start the patient on a tapered dose of prednisone starting at 70 mg p.o. daily for 2 weeks followed by 50 mg p.o. daily for 1 week followed by 30 mg p.o. daily for 1 week followed by 20 mg p.o. daily for 1 week followed by 10 mg p.o. daily for 1 week. We will arrange for the patient to have repeat CT scan of the chest in around 5 weeks for restaging of his disease. He was advised to monitor his blood sugar closely and also to take omeprazole for GI prophylaxis. The patient was advised to call immediately if he has any concerning symptoms in the interval. The total time spent in the appointment was 30 minutes. Disclaimer: This note was dictated with voice recognition software. Similar  sounding words can inadvertently be transcribed and may be missed upon review. Lajuana Matte, MD

## 2023-03-18 ENCOUNTER — Other Ambulatory Visit: Payer: Self-pay

## 2023-03-19 ENCOUNTER — Other Ambulatory Visit: Payer: Self-pay

## 2023-03-19 ENCOUNTER — Inpatient Hospital Stay: Payer: PPO

## 2023-03-19 ENCOUNTER — Inpatient Hospital Stay (HOSPITAL_BASED_OUTPATIENT_CLINIC_OR_DEPARTMENT_OTHER): Payer: PPO | Admitting: Physician Assistant

## 2023-03-19 ENCOUNTER — Inpatient Hospital Stay: Payer: PPO | Attending: Radiation Oncology

## 2023-03-19 VITALS — BP 125/59 | HR 89 | Temp 97.0°F | Resp 18 | Wt 166.4 lb

## 2023-03-19 DIAGNOSIS — Z9221 Personal history of antineoplastic chemotherapy: Secondary | ICD-10-CM | POA: Diagnosis not present

## 2023-03-19 DIAGNOSIS — C3412 Malignant neoplasm of upper lobe, left bronchus or lung: Secondary | ICD-10-CM | POA: Insufficient documentation

## 2023-03-19 DIAGNOSIS — Z923 Personal history of irradiation: Secondary | ICD-10-CM | POA: Insufficient documentation

## 2023-03-19 DIAGNOSIS — R21 Rash and other nonspecific skin eruption: Secondary | ICD-10-CM

## 2023-03-19 DIAGNOSIS — C3432 Malignant neoplasm of lower lobe, left bronchus or lung: Secondary | ICD-10-CM

## 2023-03-19 DIAGNOSIS — Z79620 Long term (current) use of immunosuppressive biologic: Secondary | ICD-10-CM | POA: Diagnosis not present

## 2023-03-19 LAB — CMP (CANCER CENTER ONLY)
ALT: 6 U/L (ref 0–44)
AST: 10 U/L — ABNORMAL LOW (ref 15–41)
Albumin: 3.9 g/dL (ref 3.5–5.0)
Alkaline Phosphatase: 122 U/L (ref 38–126)
Anion gap: 8 (ref 5–15)
BUN: 10 mg/dL (ref 8–23)
CO2: 26 mmol/L (ref 22–32)
Calcium: 9.1 mg/dL (ref 8.9–10.3)
Chloride: 108 mmol/L (ref 98–111)
Creatinine: 1.38 mg/dL — ABNORMAL HIGH (ref 0.61–1.24)
GFR, Estimated: 54 mL/min — ABNORMAL LOW (ref >60)
Glucose, Bld: 89 mg/dL (ref 70–99)
Potassium: 3.8 mmol/L (ref 3.5–5.1)
Sodium: 142 mmol/L (ref 135–145)
Total Bilirubin: 0.5 mg/dL (ref 0.3–1.2)
Total Protein: 7.2 g/dL (ref 6.5–8.1)

## 2023-03-19 LAB — CBC WITH DIFFERENTIAL (CANCER CENTER ONLY)
Abs Immature Granulocytes: 0.18 10*3/uL — ABNORMAL HIGH (ref 0.00–0.07)
Basophils Absolute: 0.1 10*3/uL (ref 0.0–0.1)
Basophils Relative: 1 %
Eosinophils Absolute: 0.2 10*3/uL (ref 0.0–0.5)
Eosinophils Relative: 1 %
HCT: 43.9 % (ref 39.0–52.0)
Hemoglobin: 13.7 g/dL (ref 13.0–17.0)
Immature Granulocytes: 1 %
Lymphocytes Relative: 7 %
Lymphs Abs: 0.9 10*3/uL (ref 0.7–4.0)
MCH: 26.7 pg (ref 26.0–34.0)
MCHC: 31.2 g/dL (ref 30.0–36.0)
MCV: 85.6 fL (ref 80.0–100.0)
Monocytes Absolute: 1 10*3/uL (ref 0.1–1.0)
Monocytes Relative: 8 %
Neutro Abs: 10.5 10*3/uL — ABNORMAL HIGH (ref 1.7–7.7)
Neutrophils Relative %: 82 %
Platelet Count: 359 10*3/uL (ref 150–400)
RBC: 5.13 MIL/uL (ref 4.22–5.81)
RDW: 15.7 % — ABNORMAL HIGH (ref 11.5–15.5)
WBC Count: 12.8 10*3/uL — ABNORMAL HIGH (ref 4.0–10.5)
nRBC: 0 % (ref 0.0–0.2)

## 2023-03-19 LAB — TSH: TSH: 1.756 u[IU]/mL (ref 0.350–4.500)

## 2023-03-19 MED ORDER — PREDNISONE 10 MG PO TABS: ORAL_TABLET | ORAL | 0 refills | Status: DC

## 2023-03-21 LAB — T4: T4, Total: 7.1 ug/dL (ref 4.5–12.0)

## 2023-03-24 IMAGING — CT CT CHEST W/ CM
2 of 4 series · 14 of 36 positions shown, 17 images · IV contrast (OMNIPAQUE)
Comparison: December 14, 2021.

CLINICAL DATA: A 70-year-old male presents for evaluation of
non-small cell lung cancer in follow-up.

* Tracking Code: BO *
EXAM:
CT CHEST WITH CONTRAST
TECHNIQUE: Multidetector CT imaging of the chest was performed during
intravenous contrast administration.

[Series 2: axial st · axial · 0.76mm/px · z∈[-310,-24]mm · 11 of 169 slices shown, 14 images]
[im 13/169  mediastinal]
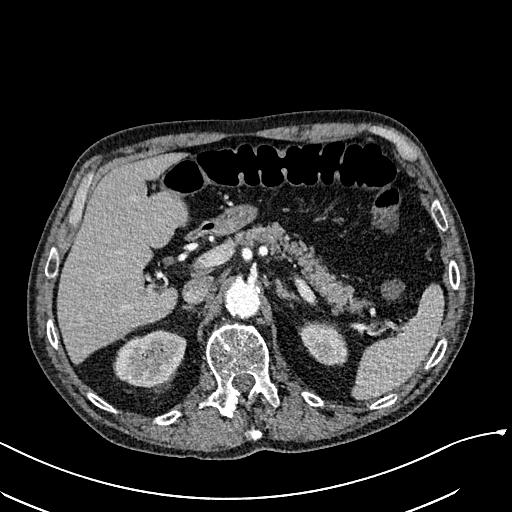
[im 13/169  lung]
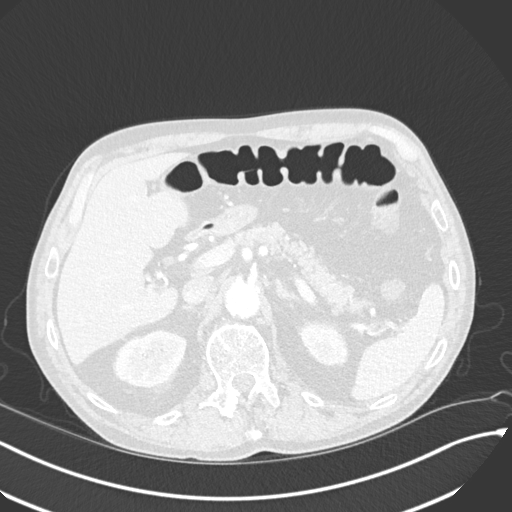
[im 26/169  lung]
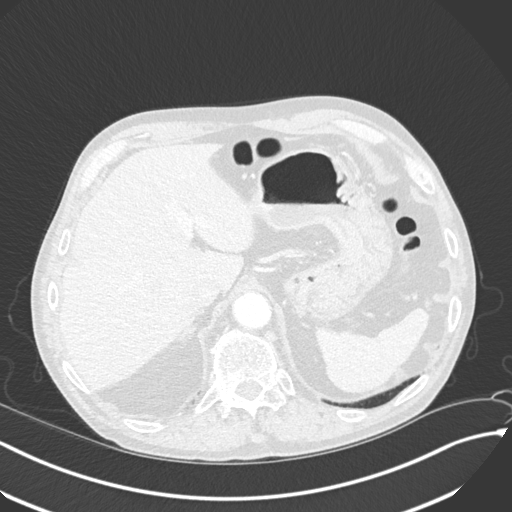
[im 39/169  lung]
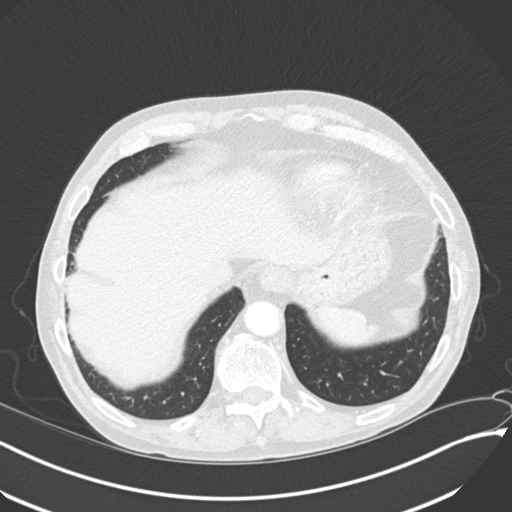
[im 52/169  lung]
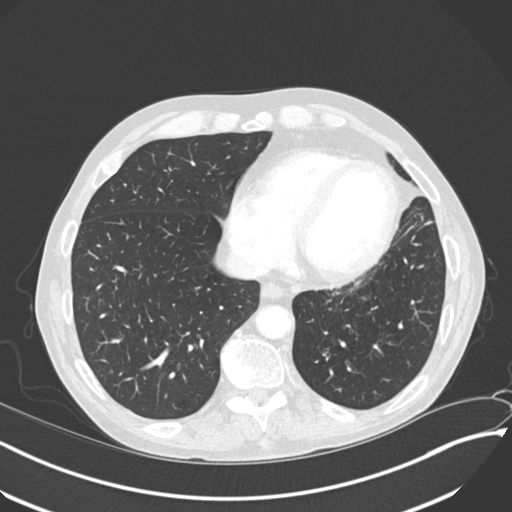
[im 65/169  mediastinal]
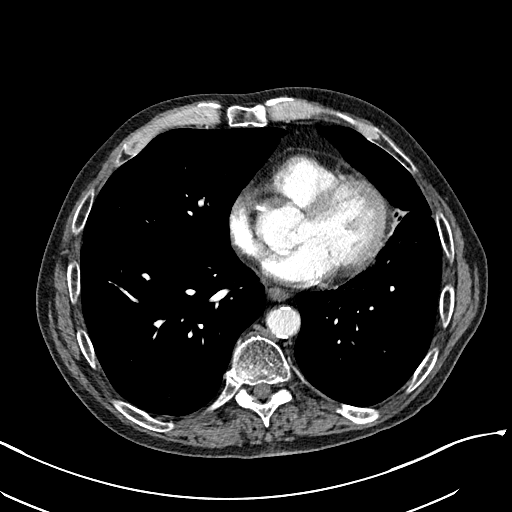
[im 65/169  lung]
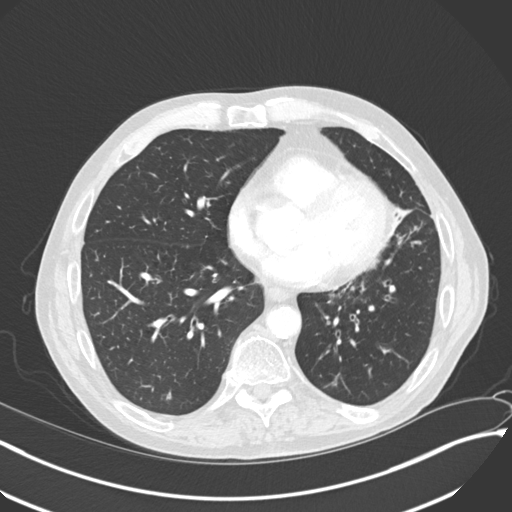
[im 91/169  lung]
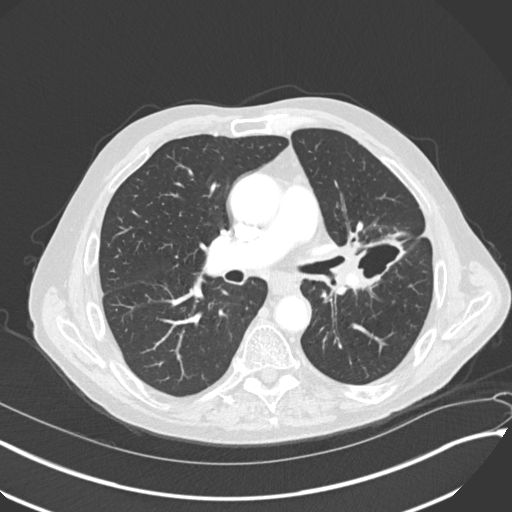
[im 104/169  lung]
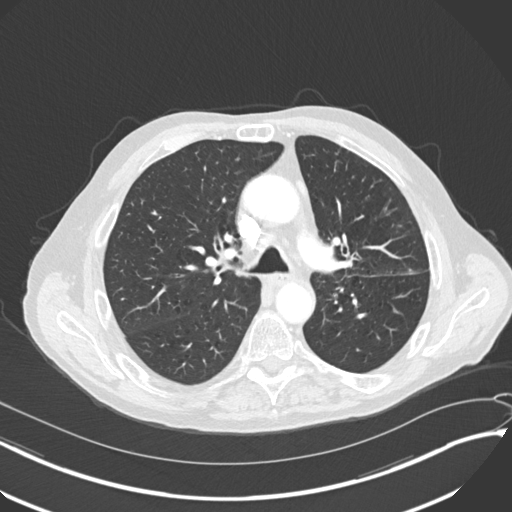
[im 117/169  lung]
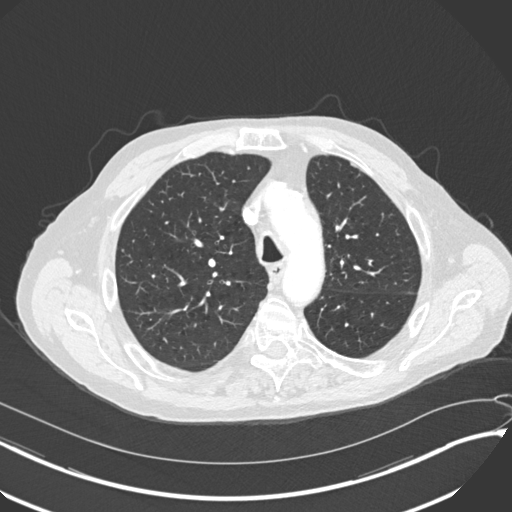
[im 130/169  mediastinal]
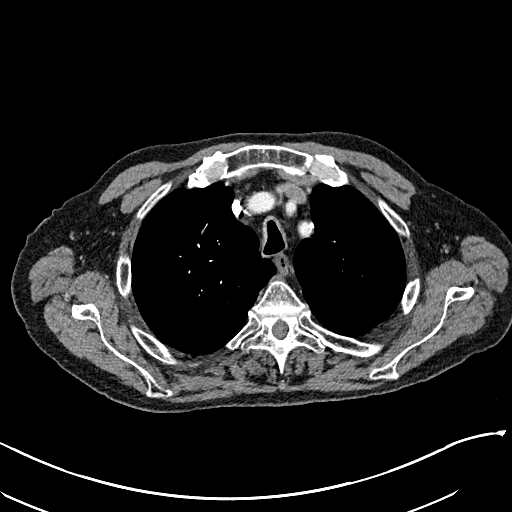
[im 130/169  lung]
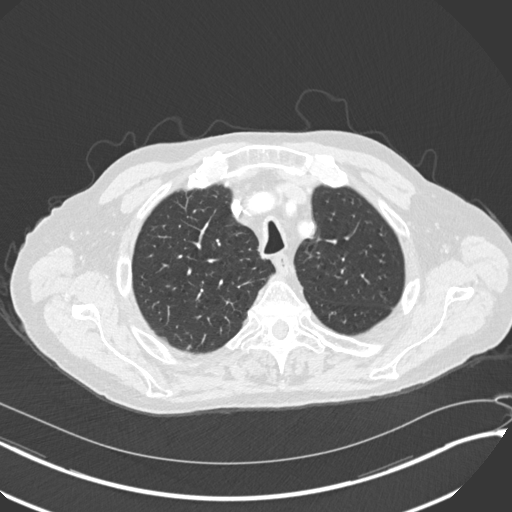
[im 143/169  lung]
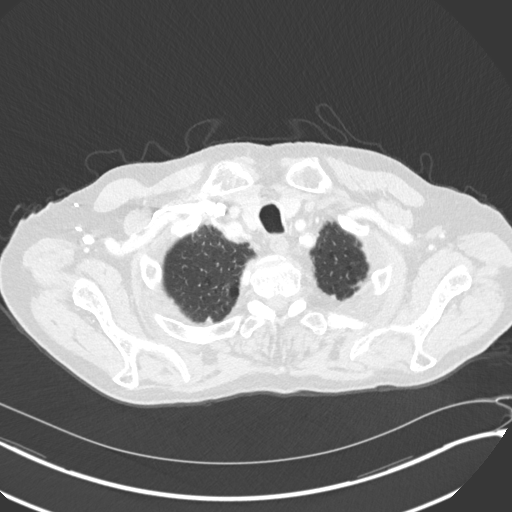
[im 156/169  lung]
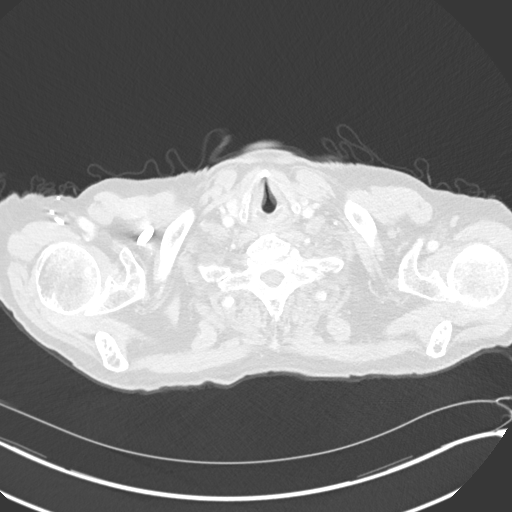

[Series 5: coronal · coronal · 0.66mm/px · 3 of 147 slices shown]
[im 30/147  lung]
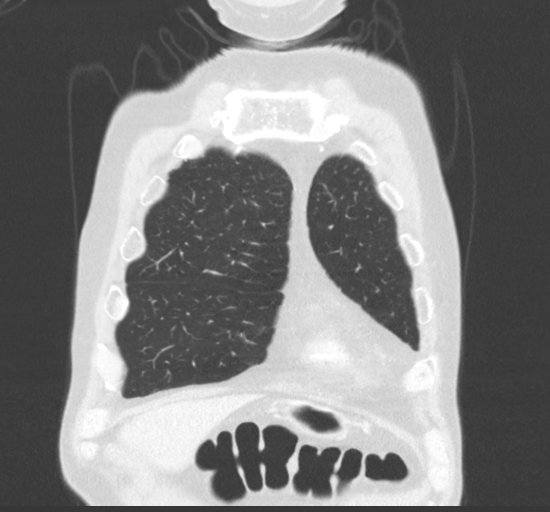
[im 59/147  lung]
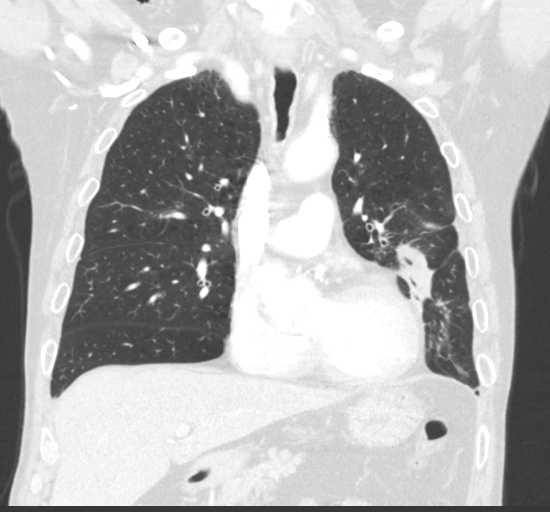
[im 88/147  lung]
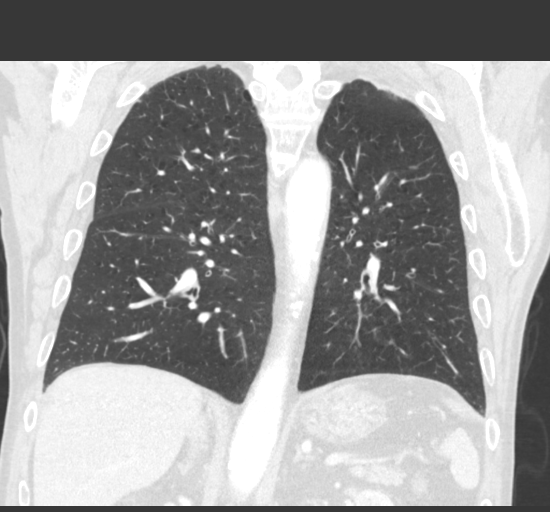

[14 of 36 positions shown; findings below may reference images not displayed]

RADIATION DOSE REDUCTION: This exam was performed according to the
departmental dose-optimization program which includes automated
exposure control, adjustment of the mA and/or kV according to
patient size and/or use of iterative reconstruction technique.

CONTRAST:  75mL OMNIPAQUE IOHEXOL 300 MG/ML  SOLN
FINDINGS: Cardiovascular: Aorta is of normal caliber with scattered calcified
and noncalcified plaque. Normal heart size without pericardial
effusion. Three-vessel coronary artery disease. Central pulmonary
vasculature is unremarkable on venous phase.

Mediastinum/Nodes: LEFT hilar adenopathy diminished in size. No
lymph nodes at the LEFT hilum beyond a cm as discrete lymph nodes.
Just below LEFT pulmonary artery and about bronchial structures
there was of nodal conglomerate and or soft tissue that measured as
much as 3.8 x 2.0 cm on the prior study now with 11 mm thickness
tracking along central bronchovascular structures.

No signs of mediastinal or thoracic inlet adenopathy. No axillary
lymphadenopathy. Esophagus grossly normal. No RIGHT hilar
adenopathy.

Subcarinal nodal tissue has diminished in size now measuring 5 mm as
compared to 12 mm.

Lungs/Pleura: Juxta hilar cavitary mass is diminished in size,
situated along the major fissure in the LEFT chest (image 82/7)
x 3.0 cm previously 6.5 x 3.9 cm when measured in a similar fashion.
Greatest wall thickness on today's study is 9 mm, and in the same
location on the prior study this measured 17 mm. No new pulmonary
nodules. No sign of pleural effusion. Airways are patent into the
RIGHT chest, narrowed at the LEFT hilum but patent and with improved
appearance of airway narrowing that was present previously.

Pulmonary emphysema as before.

Upper Abdomen: No acute findings relative to visualized liver,
gallbladder, pancreas, spleen, adrenal glands or kidneys. Subtle
area of low attenuation in the neck of the pancreas partially
visualized measuring as much as 1.8 x 1.1 cm and not associated with
ductal dilation may represent a cystic pancreatic neoplasm and is
not associated with biliary duct distension or pancreatic duct
distension, unchanged compared to previous imaging with respect to
imaged portions.

Musculoskeletal: No acute bone finding. No destructive bone process.
Spinal degenerative changes.
IMPRESSION: 1. Interval response to therapy with resolution of mediastinal
adenopathy and decreased size of large cavitary mass that is
contiguous with the LEFT hilum and involves the major fissure in the
LEFT upper lobe.
2. Subtle area of low attenuation in the neck of the pancreas
favored to represent a small cystic pancreatic neoplasm, such as
IPMN, incompletely evaluated. Consider three-month follow-up
MRI/MRCP to establish a baseline and determine whether follow-up
could be incorporated into subsequent follow-up for the patient's
lung cancer.
3. Three-vessel coronary artery disease.

Aortic Atherosclerosis (SBU96-54O.O) and Emphysema ([id number].X).

## 2023-04-18 ENCOUNTER — Ambulatory Visit (HOSPITAL_COMMUNITY)
Admission: RE | Admit: 2023-04-18 | Discharge: 2023-04-18 | Disposition: A | Discharge status: Home / Self Care | Payer: PPO | Source: Ambulatory Visit | Attending: Physician Assistant | Admitting: Physician Assistant

## 2023-04-18 DIAGNOSIS — I6340 Cerebral infarction due to embolism of unspecified cerebral artery: Secondary | ICD-10-CM | POA: Diagnosis not present

## 2023-04-18 DIAGNOSIS — R29818 Other symptoms and signs involving the nervous system: Secondary | ICD-10-CM | POA: Diagnosis not present

## 2023-04-18 DIAGNOSIS — E872 Acidosis, unspecified: Secondary | ICD-10-CM | POA: Diagnosis not present

## 2023-04-18 DIAGNOSIS — N179 Acute kidney failure, unspecified: Secondary | ICD-10-CM | POA: Diagnosis not present

## 2023-04-18 DIAGNOSIS — R5381 Other malaise: Secondary | ICD-10-CM | POA: Diagnosis not present

## 2023-04-18 DIAGNOSIS — Z66 Do not resuscitate: Secondary | ICD-10-CM | POA: Diagnosis not present

## 2023-04-18 DIAGNOSIS — C3490 Malignant neoplasm of unspecified part of unspecified bronchus or lung: Secondary | ICD-10-CM | POA: Diagnosis not present

## 2023-04-18 DIAGNOSIS — H5340 Unspecified visual field defects: Secondary | ICD-10-CM | POA: Diagnosis not present

## 2023-04-18 DIAGNOSIS — I2102 ST elevation (STEMI) myocardial infarction involving left anterior descending coronary artery: Secondary | ICD-10-CM | POA: Diagnosis not present

## 2023-04-18 DIAGNOSIS — R0902 Hypoxemia: Secondary | ICD-10-CM | POA: Diagnosis not present

## 2023-04-18 DIAGNOSIS — J9601 Acute respiratory failure with hypoxia: Secondary | ICD-10-CM | POA: Diagnosis not present

## 2023-04-18 DIAGNOSIS — R1310 Dysphagia, unspecified: Secondary | ICD-10-CM | POA: Diagnosis not present

## 2023-04-18 DIAGNOSIS — K8020 Calculus of gallbladder without cholecystitis without obstruction: Secondary | ICD-10-CM | POA: Diagnosis not present

## 2023-04-18 DIAGNOSIS — R4182 Altered mental status, unspecified: Secondary | ICD-10-CM | POA: Diagnosis not present

## 2023-04-18 DIAGNOSIS — R0689 Other abnormalities of breathing: Secondary | ICD-10-CM | POA: Diagnosis not present

## 2023-04-18 DIAGNOSIS — G253 Myoclonus: Secondary | ICD-10-CM | POA: Diagnosis not present

## 2023-04-18 DIAGNOSIS — C3432 Malignant neoplasm of lower lobe, left bronchus or lung: Secondary | ICD-10-CM

## 2023-04-18 DIAGNOSIS — I2109 ST elevation (STEMI) myocardial infarction involving other coronary artery of anterior wall: Secondary | ICD-10-CM | POA: Diagnosis not present

## 2023-04-18 DIAGNOSIS — I82511 Chronic embolism and thrombosis of right femoral vein: Secondary | ICD-10-CM | POA: Diagnosis not present

## 2023-04-18 DIAGNOSIS — I639 Cerebral infarction, unspecified: Secondary | ICD-10-CM | POA: Diagnosis not present

## 2023-04-18 DIAGNOSIS — I5021 Acute systolic (congestive) heart failure: Secondary | ICD-10-CM | POA: Diagnosis not present

## 2023-04-18 DIAGNOSIS — I110 Hypertensive heart disease with heart failure: Secondary | ICD-10-CM | POA: Diagnosis not present

## 2023-04-18 DIAGNOSIS — I63432 Cerebral infarction due to embolism of left posterior cerebral artery: Secondary | ICD-10-CM | POA: Diagnosis not present

## 2023-04-18 DIAGNOSIS — J432 Centrilobular emphysema: Secondary | ICD-10-CM | POA: Diagnosis not present

## 2023-04-18 DIAGNOSIS — D62 Acute posthemorrhagic anemia: Secondary | ICD-10-CM | POA: Diagnosis not present

## 2023-04-18 DIAGNOSIS — D72829 Elevated white blood cell count, unspecified: Secondary | ICD-10-CM | POA: Diagnosis not present

## 2023-04-18 DIAGNOSIS — R410 Disorientation, unspecified: Secondary | ICD-10-CM | POA: Diagnosis not present

## 2023-04-18 DIAGNOSIS — R9431 Abnormal electrocardiogram [ECG] [EKG]: Secondary | ICD-10-CM | POA: Diagnosis not present

## 2023-04-18 DIAGNOSIS — J188 Other pneumonia, unspecified organism: Secondary | ICD-10-CM | POA: Diagnosis not present

## 2023-04-18 DIAGNOSIS — I213 ST elevation (STEMI) myocardial infarction of unspecified site: Secondary | ICD-10-CM | POA: Diagnosis not present

## 2023-04-18 DIAGNOSIS — I63532 Cerebral infarction due to unspecified occlusion or stenosis of left posterior cerebral artery: Secondary | ICD-10-CM | POA: Diagnosis not present

## 2023-04-18 DIAGNOSIS — I499 Cardiac arrhythmia, unspecified: Secondary | ICD-10-CM | POA: Diagnosis not present

## 2023-04-18 DIAGNOSIS — D6869 Other thrombophilia: Secondary | ICD-10-CM | POA: Diagnosis not present

## 2023-04-18 DIAGNOSIS — E785 Hyperlipidemia, unspecified: Secondary | ICD-10-CM | POA: Diagnosis not present

## 2023-04-18 DIAGNOSIS — Z515 Encounter for palliative care: Secondary | ICD-10-CM | POA: Diagnosis not present

## 2023-04-18 DIAGNOSIS — G9340 Encephalopathy, unspecified: Secondary | ICD-10-CM | POA: Diagnosis not present

## 2023-04-18 DIAGNOSIS — K921 Melena: Secondary | ICD-10-CM | POA: Diagnosis not present

## 2023-04-18 DIAGNOSIS — Z7189 Other specified counseling: Secondary | ICD-10-CM | POA: Diagnosis not present

## 2023-04-18 DIAGNOSIS — Z7409 Other reduced mobility: Secondary | ICD-10-CM | POA: Diagnosis not present

## 2023-04-18 DIAGNOSIS — G9341 Metabolic encephalopathy: Secondary | ICD-10-CM | POA: Diagnosis not present

## 2023-04-18 DIAGNOSIS — I959 Hypotension, unspecified: Secondary | ICD-10-CM | POA: Diagnosis not present

## 2023-04-18 MED ORDER — SODIUM CHLORIDE (PF) 0.9 % IJ SOLN
INTRAMUSCULAR | Status: AC
  Filled 2023-04-18: qty 50

## 2023-04-18 MED ORDER — IOHEXOL 300 MG/ML  SOLN
75.0000 mL | Freq: Once | INTRAMUSCULAR | Status: AC | PRN
  Administered 2023-04-18: 75 mL via INTRAVENOUS

## 2023-04-20 ENCOUNTER — Inpatient Hospital Stay (HOSPITAL_COMMUNITY): Payer: PPO

## 2023-04-20 ENCOUNTER — Emergency Department (HOSPITAL_COMMUNITY): Payer: PPO

## 2023-04-20 ENCOUNTER — Encounter (HOSPITAL_COMMUNITY): Payer: Self-pay

## 2023-04-20 ENCOUNTER — Encounter (HOSPITAL_COMMUNITY)
Admission: EM | Disposition: A | Discharge status: Hospice | Payer: Self-pay | Source: Home / Self Care | Attending: Pulmonary Disease

## 2023-04-20 ENCOUNTER — Inpatient Hospital Stay (HOSPITAL_COMMUNITY)
Admission: EM | Admit: 2023-04-20 | Discharge: 2023-04-24 | DRG: 064 | Disposition: A | Discharge status: Hospice | Payer: PPO | Attending: Internal Medicine | Admitting: Internal Medicine

## 2023-04-20 ENCOUNTER — Other Ambulatory Visit: Payer: Self-pay

## 2023-04-20 DIAGNOSIS — I6340 Cerebral infarction due to embolism of unspecified cerebral artery: Secondary | ICD-10-CM | POA: Diagnosis not present

## 2023-04-20 DIAGNOSIS — K921 Melena: Secondary | ICD-10-CM | POA: Diagnosis present

## 2023-04-20 DIAGNOSIS — Z7401 Bed confinement status: Secondary | ICD-10-CM

## 2023-04-20 DIAGNOSIS — G9340 Encephalopathy, unspecified: Secondary | ICD-10-CM | POA: Diagnosis not present

## 2023-04-20 DIAGNOSIS — I63432 Cerebral infarction due to embolism of left posterior cerebral artery: Secondary | ICD-10-CM | POA: Diagnosis present

## 2023-04-20 DIAGNOSIS — G253 Myoclonus: Secondary | ICD-10-CM | POA: Diagnosis present

## 2023-04-20 DIAGNOSIS — I639 Cerebral infarction, unspecified: Secondary | ICD-10-CM | POA: Diagnosis not present

## 2023-04-20 DIAGNOSIS — D62 Acute posthemorrhagic anemia: Secondary | ICD-10-CM | POA: Diagnosis present

## 2023-04-20 DIAGNOSIS — H5340 Unspecified visual field defects: Secondary | ICD-10-CM | POA: Diagnosis present

## 2023-04-20 DIAGNOSIS — N179 Acute kidney failure, unspecified: Secondary | ICD-10-CM | POA: Diagnosis present

## 2023-04-20 DIAGNOSIS — R410 Disorientation, unspecified: Secondary | ICD-10-CM | POA: Diagnosis present

## 2023-04-20 DIAGNOSIS — Z79899 Other long term (current) drug therapy: Secondary | ICD-10-CM

## 2023-04-20 DIAGNOSIS — R5381 Other malaise: Secondary | ICD-10-CM | POA: Diagnosis present

## 2023-04-20 DIAGNOSIS — J188 Other pneumonia, unspecified organism: Secondary | ICD-10-CM | POA: Diagnosis present

## 2023-04-20 DIAGNOSIS — Z515 Encounter for palliative care: Secondary | ICD-10-CM

## 2023-04-20 DIAGNOSIS — R197 Diarrhea, unspecified: Secondary | ICD-10-CM | POA: Diagnosis not present

## 2023-04-20 DIAGNOSIS — Z888 Allergy status to other drugs, medicaments and biological substances status: Secondary | ICD-10-CM

## 2023-04-20 DIAGNOSIS — I82511 Chronic embolism and thrombosis of right femoral vein: Secondary | ICD-10-CM | POA: Diagnosis present

## 2023-04-20 DIAGNOSIS — I2109 ST elevation (STEMI) myocardial infarction involving other coronary artery of anterior wall: Secondary | ICD-10-CM | POA: Diagnosis present

## 2023-04-20 DIAGNOSIS — I2102 ST elevation (STEMI) myocardial infarction involving left anterior descending coronary artery: Secondary | ICD-10-CM | POA: Diagnosis not present

## 2023-04-20 DIAGNOSIS — T50995A Adverse effect of other drugs, medicaments and biological substances, initial encounter: Secondary | ICD-10-CM | POA: Diagnosis present

## 2023-04-20 DIAGNOSIS — E872 Acidosis, unspecified: Secondary | ICD-10-CM | POA: Diagnosis present

## 2023-04-20 DIAGNOSIS — I959 Hypotension, unspecified: Secondary | ICD-10-CM | POA: Diagnosis present

## 2023-04-20 DIAGNOSIS — G9341 Metabolic encephalopathy: Secondary | ICD-10-CM | POA: Diagnosis present

## 2023-04-20 DIAGNOSIS — R9431 Abnormal electrocardiogram [ECG] [EKG]: Secondary | ICD-10-CM | POA: Diagnosis not present

## 2023-04-20 DIAGNOSIS — R7401 Elevation of levels of liver transaminase levels: Secondary | ICD-10-CM | POA: Diagnosis present

## 2023-04-20 DIAGNOSIS — I5021 Acute systolic (congestive) heart failure: Secondary | ICD-10-CM | POA: Diagnosis present

## 2023-04-20 DIAGNOSIS — D6869 Other thrombophilia: Secondary | ICD-10-CM | POA: Diagnosis present

## 2023-04-20 DIAGNOSIS — D72829 Elevated white blood cell count, unspecified: Secondary | ICD-10-CM | POA: Diagnosis present

## 2023-04-20 DIAGNOSIS — J9601 Acute respiratory failure with hypoxia: Secondary | ICD-10-CM | POA: Diagnosis not present

## 2023-04-20 DIAGNOSIS — E785 Hyperlipidemia, unspecified: Secondary | ICD-10-CM | POA: Diagnosis present

## 2023-04-20 DIAGNOSIS — I255 Ischemic cardiomyopathy: Secondary | ICD-10-CM | POA: Diagnosis present

## 2023-04-20 DIAGNOSIS — R1310 Dysphagia, unspecified: Secondary | ICD-10-CM | POA: Diagnosis present

## 2023-04-20 DIAGNOSIS — I63532 Cerebral infarction due to unspecified occlusion or stenosis of left posterior cerebral artery: Secondary | ICD-10-CM

## 2023-04-20 DIAGNOSIS — Z7409 Other reduced mobility: Secondary | ICD-10-CM | POA: Diagnosis present

## 2023-04-20 DIAGNOSIS — I213 ST elevation (STEMI) myocardial infarction of unspecified site: Secondary | ICD-10-CM | POA: Diagnosis not present

## 2023-04-20 DIAGNOSIS — R591 Generalized enlarged lymph nodes: Secondary | ICD-10-CM | POA: Diagnosis present

## 2023-04-20 DIAGNOSIS — C3490 Malignant neoplasm of unspecified part of unspecified bronchus or lung: Secondary | ICD-10-CM | POA: Diagnosis present

## 2023-04-20 DIAGNOSIS — M109 Gout, unspecified: Secondary | ICD-10-CM | POA: Diagnosis present

## 2023-04-20 DIAGNOSIS — Z66 Do not resuscitate: Secondary | ICD-10-CM | POA: Diagnosis present

## 2023-04-20 DIAGNOSIS — R4182 Altered mental status, unspecified: Secondary | ICD-10-CM | POA: Diagnosis not present

## 2023-04-20 DIAGNOSIS — Z87891 Personal history of nicotine dependence: Secondary | ICD-10-CM

## 2023-04-20 DIAGNOSIS — R29705 NIHSS score 5: Secondary | ICD-10-CM | POA: Diagnosis present

## 2023-04-20 DIAGNOSIS — Z7189 Other specified counseling: Secondary | ICD-10-CM | POA: Diagnosis not present

## 2023-04-20 DIAGNOSIS — I110 Hypertensive heart disease with heart failure: Secondary | ICD-10-CM | POA: Diagnosis present

## 2023-04-20 DIAGNOSIS — Z538 Procedure and treatment not carried out for other reasons: Secondary | ICD-10-CM | POA: Diagnosis present

## 2023-04-20 DIAGNOSIS — Z923 Personal history of irradiation: Secondary | ICD-10-CM

## 2023-04-20 DIAGNOSIS — K219 Gastro-esophageal reflux disease without esophagitis: Secondary | ICD-10-CM | POA: Diagnosis present

## 2023-04-20 LAB — CBC WITH DIFFERENTIAL/PLATELET
Abs Immature Granulocytes: 0.2 10*3/uL — ABNORMAL HIGH (ref 0.00–0.07)
Basophils Absolute: 0 10*3/uL (ref 0.0–0.1)
Basophils Relative: 0 %
Eosinophils Absolute: 0 10*3/uL (ref 0.0–0.5)
Eosinophils Relative: 0 %
HCT: 37.2 % — ABNORMAL LOW (ref 39.0–52.0)
Hemoglobin: 11.3 g/dL — ABNORMAL LOW (ref 13.0–17.0)
Immature Granulocytes: 1 %
Lymphocytes Relative: 4 %
Lymphs Abs: 0.7 10*3/uL (ref 0.7–4.0)
MCH: 26.4 pg (ref 26.0–34.0)
MCHC: 30.4 g/dL (ref 30.0–36.0)
MCV: 86.9 fL (ref 80.0–100.0)
Monocytes Absolute: 1.3 10*3/uL — ABNORMAL HIGH (ref 0.1–1.0)
Monocytes Relative: 8 %
Neutro Abs: 15.2 10*3/uL — ABNORMAL HIGH (ref 1.7–7.7)
Neutrophils Relative %: 87 %
Platelets: 252 10*3/uL (ref 150–400)
RBC: 4.28 MIL/uL (ref 4.22–5.81)
RDW: 15.5 % (ref 11.5–15.5)
WBC: 17.4 10*3/uL — ABNORMAL HIGH (ref 4.0–10.5)
nRBC: 0 % (ref 0.0–0.2)

## 2023-04-20 LAB — LIPID PANEL
Cholesterol: 100 mg/dL (ref 0–200)
HDL: 32 mg/dL — ABNORMAL LOW (ref >40)
LDL Cholesterol: 54 mg/dL (ref 0–99)
Total CHOL/HDL Ratio: 3.1 RATIO
Triglycerides: 69 mg/dL (ref <150)
VLDL: 14 mg/dL (ref 0–40)

## 2023-04-20 LAB — HEMOGLOBIN A1C
Hgb A1c MFr Bld: 5.2 % (ref 4.8–5.6)
Mean Plasma Glucose: 102.54 mg/dL

## 2023-04-20 LAB — COMPREHENSIVE METABOLIC PANEL
ALT: 43 U/L (ref 0–44)
AST: 75 U/L — ABNORMAL HIGH (ref 15–41)
Albumin: 2.1 g/dL — ABNORMAL LOW (ref 3.5–5.0)
Alkaline Phosphatase: 76 U/L (ref 38–126)
Anion gap: 14 (ref 5–15)
BUN: 39 mg/dL — ABNORMAL HIGH (ref 8–23)
CO2: 19 mmol/L — ABNORMAL LOW (ref 22–32)
Calcium: 7.6 mg/dL — ABNORMAL LOW (ref 8.9–10.3)
Chloride: 103 mmol/L (ref 98–111)
Creatinine, Ser: 3.06 mg/dL — ABNORMAL HIGH (ref 0.61–1.24)
GFR, Estimated: 21 mL/min — ABNORMAL LOW (ref >60)
Glucose, Bld: 102 mg/dL — ABNORMAL HIGH (ref 70–99)
Potassium: 4.6 mmol/L (ref 3.5–5.1)
Sodium: 136 mmol/L (ref 135–145)
Total Bilirubin: 1.2 mg/dL (ref 0.3–1.2)
Total Protein: 5.8 g/dL — ABNORMAL LOW (ref 6.5–8.1)

## 2023-04-20 LAB — APTT: aPTT: 31 seconds (ref 24–36)

## 2023-04-20 LAB — CBG MONITORING, ED: Glucose-Capillary: 93 mg/dL (ref 70–99)

## 2023-04-20 LAB — GLUCOSE, CAPILLARY: Glucose-Capillary: 88 mg/dL (ref 70–99)

## 2023-04-20 LAB — PROTIME-INR
INR: 1.3 — ABNORMAL HIGH (ref 0.8–1.2)
Prothrombin Time: 16.5 seconds — ABNORMAL HIGH (ref 11.4–15.2)

## 2023-04-20 LAB — TROPONIN I (HIGH SENSITIVITY): Troponin I (High Sensitivity): >24000 ng/L (ref <18)

## 2023-04-20 LAB — MRSA NEXT GEN BY PCR, NASAL: MRSA by PCR Next Gen: NOT DETECTED

## 2023-04-20 LAB — SAMPLE TO BLOOD BANK

## 2023-04-20 LAB — MAGNESIUM: Magnesium: 1.9 mg/dL (ref 1.7–2.4)

## 2023-04-20 SURGERY — CORONARY/GRAFT ACUTE MI REVASCULARIZATION
Anesthesia: LOCAL

## 2023-04-20 MED ORDER — HEPARIN SODIUM (PORCINE) 1000 UNIT/ML IJ SOLN
INTRAMUSCULAR | Status: AC
  Filled 2023-04-20: qty 10

## 2023-04-20 MED ORDER — LORAZEPAM 1 MG PO TABS
1.0000 mg | ORAL_TABLET | Freq: Once | ORAL | Status: AC | PRN
  Administered 2023-04-20: 1 mg via ORAL
  Filled 2023-04-20: qty 1

## 2023-04-20 MED ORDER — ASPIRIN 81 MG PO CHEW
324.0000 mg | CHEWABLE_TABLET | Freq: Once | ORAL | Status: AC
  Administered 2023-04-20: 324 mg via ORAL
  Filled 2023-04-20: qty 4

## 2023-04-20 MED ORDER — STROKE: EARLY STAGES OF RECOVERY BOOK
Freq: Once | Status: AC
  Filled 2023-04-20: qty 1

## 2023-04-20 MED ORDER — VERAPAMIL HCL 2.5 MG/ML IV SOLN
INTRAVENOUS | Status: AC
  Filled 2023-04-20: qty 2

## 2023-04-20 MED ORDER — LIDOCAINE HCL (PF) 1 % IJ SOLN
INTRAMUSCULAR | Status: AC
  Filled 2023-04-20: qty 30

## 2023-04-20 MED ORDER — HEPARIN BOLUS VIA INFUSION: 4000.0000 [IU] | Freq: Once | INTRAVENOUS | Status: DC

## 2023-04-20 MED ORDER — ACETAMINOPHEN 325 MG PO TABS
650.0000 mg | ORAL_TABLET | ORAL | Status: DC | PRN
  Administered 2023-04-23: 650 mg via ORAL
  Filled 2023-04-20: qty 2

## 2023-04-20 MED ORDER — LACTATED RINGERS IV SOLN: INTRAVENOUS | Status: AC

## 2023-04-20 MED ORDER — NITROGLYCERIN 1 MG/10 ML FOR IR/CATH LAB
INTRA_ARTERIAL | Status: AC
  Filled 2023-04-20: qty 10

## 2023-04-20 MED ORDER — CHLORHEXIDINE GLUCONATE CLOTH 2 % EX PADS
6.0000 | MEDICATED_PAD | Freq: Every day | CUTANEOUS | Status: DC
  Administered 2023-04-21 – 2023-04-22 (×3): 6 via TOPICAL

## 2023-04-20 MED ORDER — HEPARIN (PORCINE) IN NACL 1000-0.9 UT/500ML-% IV SOLN
INTRAVENOUS | Status: DC | PRN
  Administered 2023-04-20 (×2): 500 mL

## 2023-04-20 MED ORDER — ACETAMINOPHEN 160 MG/5ML PO SOLN: 650.0000 mg | ORAL | Status: DC | PRN

## 2023-04-20 MED ORDER — POLYETHYLENE GLYCOL 3350 17 G PO PACK: 17.0000 g | PACK | Freq: Every day | ORAL | Status: DC | PRN

## 2023-04-20 MED ORDER — HEPARIN SODIUM (PORCINE) 5000 UNIT/ML IJ SOLN
4000.0000 [IU] | Freq: Once | INTRAMUSCULAR | Status: AC
  Administered 2023-04-20: 4000 [IU] via INTRAVENOUS

## 2023-04-20 MED ORDER — ACETAMINOPHEN 650 MG RE SUPP: 650.0000 mg | RECTAL | Status: DC | PRN

## 2023-04-20 MED ORDER — SODIUM CHLORIDE 0.9 % IV SOLN: INTRAVENOUS | Status: DC

## 2023-04-20 MED ORDER — LORAZEPAM 1 MG PO TABS: 1.0000 mg | ORAL_TABLET | Freq: Once | ORAL | Status: DC | PRN

## 2023-04-20 MED ORDER — LACTATED RINGERS IV BOLUS: 500.0000 mL | Freq: Once | INTRAVENOUS | Status: DC

## 2023-04-20 MED ORDER — DOCUSATE SODIUM 100 MG PO CAPS: 100.0000 mg | ORAL_CAPSULE | Freq: Two times a day (BID) | ORAL | Status: DC | PRN

## 2023-04-20 MED ORDER — LACTATED RINGERS IV BOLUS
1000.0000 mL | Freq: Once | INTRAVENOUS | Status: AC
  Administered 2023-04-20: 1000 mL via INTRAVENOUS

## 2023-04-20 NOTE — ED Notes (Signed)
Patient brought over to MRI and refused the scan.

## 2023-04-20 NOTE — Consult Note (Addendum)
Cardiology Consultation   Patient ID: Peter Diaz MRN: [id number]; DOB: August 30, 1951  Admit date: 04/20/2023 Date of Consult: 04/20/2023  PCP:  [name], MD   [facility] Providers Cardiologist:  None 1}     History of Present Illness:   Peter Diaz is a 72 yo male with history of GERD, gout, HTN and non-small cell lung cancer who presented to the ED with altered mental status. His family reports that he has been somnolent since last night. Today he was weak, somnolent and began falling. He has been confused. Currently he is oriented to person but not place. SBP 90 on EMS arrival to his home. He was given one liter of normal saline and BP is now 100/70. He denies chest pain. EKG with anterior ST elevation. Code STEMI called by ED staff. At the time of my assessment in the ED, he is having no chest pain or dyspnea. Preliminary read on head CT by ED staff was no intracranial hemorrhage. He was given IV heparin 4000 units in anticipation of cardiac cath with presumed anterior MI. When the patient arrived in the cath lab, official CT head interpretation with no evidence of IC hemorrhage, likely acute infarction in the left posterior cerebral artery territory. I reviewed this with Dr. Durwin Nora in the ED. Cardiac cath cancelled and pt transported back to the ED. Neurology consulted for acute CVA.    Past Medical History:  Diagnosis Date   GERD (gastroesophageal reflux disease)    Gout    History of radiation therapy    Left lung- (01/28/22-03/08/22)- Dr. Antony Blackbird   HTN (hypertension)     Past Surgical History:  Procedure Laterality Date   BIOPSY  01/11/2022   Procedure: BIOPSY;  Surgeon: Josephine Igo, DO;  Location: [facility];  Service: Pulmonary;;   BRAIN SURGERY     BRONCHIAL BRUSHINGS  01/11/2022   Procedure: BRONCHIAL BRUSHINGS;  Surgeon: Josephine Igo, DO;  Location: [facility];  Service: Pulmonary;;   BRONCHIAL NEEDLE ASPIRATION BIOPSY  01/11/2022    Procedure: BRONCHIAL NEEDLE ASPIRATION BIOPSIES;  Surgeon: Josephine Igo, DO;  Location: [facility];  Service: Pulmonary;;   VIDEO BRONCHOSCOPY WITH ENDOBRONCHIAL ULTRASOUND Left 01/11/2022   Procedure: VIDEO BRONCHOSCOPY WITH ENDOBRONCHIAL ULTRASOUND;  Surgeon: Josephine Igo, DO;  Location: [facility];  Service: Pulmonary;  Laterality: Left;     Home Medications:  Prior to Admission medications   Medication Sig Start Date End Date Taking? Authorizing Provider  albuterol (VENTOLIN HFA) 108 (90 Base) MCG/ACT inhaler INHALE 2 PUFFS INTO THE LUNGS EVERY 6 HOURS AS NEEDED FOR WHEEZING OR SHORTNESS OF BREATH 01/29/23   Icard, Elige Radon L, DO  amLODipine (NORVASC) 10 MG tablet Take 10 mg by mouth daily. 11/21/21   [provider]  esomeprazole (NEXIUM) 20 MG capsule Take 20 mg by mouth daily at 12 noon.    [provider]  ibuprofen (ADVIL) 200 MG tablet Take 200 mg by mouth as needed.    [provider]  mirtazapine (REMERON) 15 MG tablet TAKE 1 TABLET(15 MG) BY MOUTH AT BEDTIME 01/18/23   Heilingoetter, Cassandra L, PA-C  predniSONE (DELTASONE) 10 MG tablet Take 7 tablets (70) mg daily for 2 weeks, then take 5 tablets (50) mg daily for 1 week, followed by 3 tablets (30 mg) daily for 1 week, and then take 1 tablet daily for 7 days. Then stop 03/19/23   Heilingoetter, Cassandra L, PA-C  tamsulosin (FLOMAX) 0.4 MG CAPS capsule Take 0.4  mg by mouth at bedtime. 10/03/21   [provider]    Inpatient Medications: Scheduled Meds:  [MAR Hold] heparin  4,000 Units Intravenous Once   nitroGLYCERIN       Continuous Infusions:  sodium chloride     PRN Meds: Heparin (Porcine) in NaCl, nitroGLYCERIN  Allergies:    Allergies  Allergen Reactions   Marinol [Dronabinol] Other (See Comments)    "Feels bloated. I don' t ever want to take it "    Social History:   Social History   Socioeconomic History   Marital status: Married    Spouse name: Not on file    Number of children: Not on file   Years of education: Not on file   Highest education level: Not on file  Occupational History   Not on file  Tobacco Use   Smoking status: Former    Packs/day: .8    Types: Cigarettes    Quit date: 12/09/2021    Years since quitting: 1.3   Smokeless tobacco: Never   Tobacco comments:    Quit. 07/25/2022 Tay  Vaping Use   Vaping Use: Never used  Substance and Sexual Activity   Alcohol use: Yes   Drug use: Never   Sexual activity: Not on file  Other Topics Concern   Not on file  Social History Narrative   Not on file   Social Determinants of Health   Financial Resource Strain: Not on file  Food Insecurity: Not on file  Transportation Needs: Not on file  Physical Activity: Not on file  Stress: Not on file  Social Connections: Not on file  Intimate Partner Violence: Not on file    Family History:    Family History  Problem Relation Age of Onset   Stroke Mother    Stroke Father    Cancer Sister      ROS:  Please see the history of present illness.  All other ROS reviewed and negative.     Physical Exam/Data:   Vitals:   04/20/23 1515 04/20/23 1539 04/20/23 1545 04/20/23 1600  BP: 99/67 105/73 101/68 97/67  Pulse:   (!) 132 (!) 108  Resp: (!) 30 (!) 29 (!) 24 (!) 33  SpO2:   100% 100%   No intake or output data in the 24 hours ending 04/20/23 1621    03/19/2023   11:35 AM 02/19/2023   10:07 AM 01/22/2023   10:29 AM  Last 3 Weights  Weight (lbs) 166 lb 6.4 oz 166 lb 7 oz 157 lb 1.6 oz  Weight (kg) 75.479 kg 75.496 kg 71.26 kg     There is no height or weight on file to calculate BMI.  General:  Well nourished, well developed, in no acute distress HEENT: normal Neck: no JVD Vascular: No carotid bruits; Distal pulses 2+ bilaterally Cardiac:  normal S1, S2; RRR; no murmur  Lungs:  crackles bases. + wheezes.   Abd: soft, nontender, no hepatomegaly  Ext: no edema Musculoskeletal:  No deformities, BUE and BLE strength normal  and equal Skin: warm and dry  Neuro:  He is moving his arms and legs. No apparent motor deficits on my exam. Awake and alert. Oriented to person only.  Psych:  Normal affect   EKG:  The EKG was personally reviewed and demonstrates:  sinus tachycardia, ST elevation leads V3-V5 Telemetry:  Telemetry was personally reviewed and demonstrates:  sinus tachycardia  Relevant CV Studies:   Laboratory Data:  High Sensitivity Troponin:  No results  for input(s): "TROPONINIHS" in the last 720 hours.   ChemistryNo results for input(s): "NA", "K", "CL", "CO2", "GLUCOSE", "BUN", "CREATININE", "CALCIUM", "MG", "GFRNONAA", "GFRAA", "ANIONGAP" in the last 168 hours.  No results for input(s): "PROT", "ALBUMIN", "AST", "ALT", "ALKPHOS", "BILITOT" in the last 168 hours. Lipids No results for input(s): "CHOL", "TRIG", "HDL", "LABVLDL", "LDLCALC", "CHOLHDL" in the last 168 hours.  Hematology Recent Labs  Lab 04/20/23 1522  WBC 17.4*  RBC 4.28  HGB 11.3*  HCT 37.2*  MCV 86.9  MCH 26.4  MCHC 30.4  RDW 15.5  PLT 252   Thyroid No results for input(s): "TSH", "FREET4" in the last 168 hours.  BNPNo results for input(s): "BNP", "PROBNP" in the last 168 hours.  DDimer No results for input(s): "DDIMER" in the last 168 hours.   Radiology/Studies:  CT Head Wo Contrast  Result Date: 04/20/2023 CLINICAL DATA:  Mental status change. EXAM: CT HEAD WITHOUT CONTRAST TECHNIQUE: Contiguous axial images were obtained from the base of the skull through the vertex without intravenous contrast. RADIATION DOSE REDUCTION: This exam was performed according to the departmental dose-optimization program which includes automated exposure control, adjustment of the mA and/or kV according to patient size and/or use of iterative reconstruction technique. COMPARISON:  Brain MRI 01/22/2022 FINDINGS: Brain: Within the medial LEFT occipital lobe there is a geographic region of low cortical density along the posterior falx suggesting a  LEFT posterior cerebral arterial infarction. Infarcted region measures 5.7 by 2.5 by 2.5 cm (volume = 19 cm^3) Regional gliosis and encephalomalacia in the LEFT frontal lobe is unchanged. No intracranial hemorrhage.  No midline shift or mass effect. Vascular: No hyperdense vessel or unexpected calcification. Skull: Normal. Negative for fracture or focal lesion. Sinuses/Orbits: Extensive thickening of the maxillary sinus walls. There is evidence of prior ethmoid surgery. Mucosal thickening and fluid levels within the paranasal sinuses. Mucosal thickening extends in the frontal sinus Other: None IMPRESSION: 1. Acute or subacute infarction in the LEFT posterior cerebral artery territory (medial LEFT occipital lobe). 2. No intracranial hemorrhage. 3. Chronic LEFT frontal lobe infarction. 4. Acute on chronic sinusitis. Electronically Signed   By: Genevive Bi M.D.   On: 04/20/2023 16:00     Assessment and Plan:   Abnormal EKG Acute CVA Altered mental status  Pt presenting with altered mental status. EKG with anterior ST elevation. Code STEMI called but subsequently head CT showed acute CVA. BP 100/70. Cardiac cath will not be performed given the acute CVA. He does not have chest pain. STAT Neuro consult for acute CVA. Symptoms of altered mental status has been persistent for over 6 hours. I would cycle troponin and follow recs of Neuro regarding candidacy for anti-coagulation. It is not clear if he is also having ACS but he is not a candidate for cardiac cath currently. Echo tomorrow.   For cardiology questions tonight please call cardiology on call [phone]  For questions or updates, please contact [facility] Please consult [email or web address] for contact info under    Signed, Verne Carrow, MD  04/20/2023 4:21 PM

## 2023-04-20 NOTE — Progress Notes (Signed)
   04/20/23 1600  Spiritual Encounters  Type of Visit Initial  Care provided to: Pt and family  Referral source Code page  Reason for visit Code  OnCall Visit Yes   Ch responded to code STEMI. Family was at bedside. Pt is in route to [facility]. Ch provided hospitality and quiet space to family. No follow-up needed at this time.

## 2023-04-20 NOTE — ED Triage Notes (Signed)
Pt to ED via EMS with c/o AMS. Per EMS pt family stated that he has been acting strange lately, minimally confused and doing things out of the ordinary. Pt is Aox3 disoriented to time. He denies any injuries, CP, or SOB. EMS stated that pt was hypotensive on their arrival BP 80s systolic. EMS gave 1L of LR and BP went up to 100s systolic.

## 2023-04-20 NOTE — Progress Notes (Signed)
Pt brought to MRI via transport around 2000 for exams. Pt adamantly refusing exam upon arrival. Explained importance of exam but still refusing. Called RN to inform, advised by RN to send pt back as pt is planned to be moved to ICU.

## 2023-04-20 NOTE — ED Notes (Signed)
Report given to [facility] on [facility]

## 2023-04-20 NOTE — ED Provider Notes (Signed)
[place] EMERGENCY DEPARTMENT AT [facility] Provider Note   CSN: [id number] Arrival date & time: 04/20/23  1501     History  Chief Complaint  Patient presents with   Altered Mental Status    Peter Diaz is a 72 y.o. male.  HPI Patient presents for altered mental status.  Medical history includes GERD, gout, HTN, lung cancer.  He is currently on every 4 week immunotherapy.  He was seen 1 month ago for a diffuse rash.  He was placed on steroid taper for 5 weeks.  Today, he arrives by EMS.  EMS reports that family has noticed some changes over the past several days.  This worsened today.  Patient is typically alert and oriented.  He is currently oriented to self only.  EMS noted tachycardia, tachypnea, hypotension, improved after IV fluids, and concerning ST segment elevations on EKG.  These are shown below:  Patient currently denies any symptoms.    Home Medications Prior to Admission medications   Medication Sig Start Date End Date Taking? Authorizing Provider  albuterol (VENTOLIN HFA) 108 (90 Base) MCG/ACT inhaler INHALE 2 PUFFS INTO THE LUNGS EVERY 6 HOURS AS NEEDED FOR WHEEZING OR SHORTNESS OF BREATH 01/29/23   Icard, Elige Radon L, DO  amLODipine (NORVASC) 10 MG tablet Take 10 mg by mouth daily. 11/21/21   [provider]  esomeprazole (NEXIUM) 20 MG capsule Take 20 mg by mouth daily at 12 noon.    [provider]  ibuprofen (ADVIL) 200 MG tablet Take 200 mg by mouth as needed.    [provider]  mirtazapine (REMERON) 15 MG tablet TAKE 1 TABLET(15 MG) BY MOUTH AT BEDTIME 01/18/23   Heilingoetter, Cassandra L, PA-C  predniSONE (DELTASONE) 10 MG tablet Take 7 tablets (70) mg daily for 2 weeks, then take 5 tablets (50) mg daily for 1 week, followed by 3 tablets (30 mg) daily for 1 week, and then take 1 tablet daily for 7 days. Then stop 03/19/23   Heilingoetter, Cassandra L, PA-C  tamsulosin (FLOMAX) 0.4 MG CAPS capsule Take 0.4 mg by mouth at  bedtime. 10/03/21   [provider]      Allergies    Marinol [dronabinol]    Review of Systems   Review of Systems  Unable to perform ROS: Mental status change    Physical Exam Updated Vital Signs BP 99/67   Pulse (!) 108   Temp 97.6 F (36.4 C) (Oral)   Resp (!) 31   SpO2 98%  Physical Exam Vitals and nursing note reviewed.  Constitutional:      General: He is not in acute distress.    Appearance: He is well-developed and normal weight. He is ill-appearing. He is not toxic-appearing or diaphoretic.  HENT:     Head: Normocephalic and atraumatic.     Right Ear: External ear normal.     Left Ear: External ear normal.     Nose: Nose normal.     Mouth/Throat:     Mouth: Mucous membranes are moist.  Eyes:     Extraocular Movements: Extraocular movements intact.     Conjunctiva/sclera: Conjunctivae normal.  Cardiovascular:     Rate and Rhythm: Normal rate and regular rhythm.     Heart sounds: No murmur heard. Pulmonary:     Effort: Pulmonary effort is normal. No respiratory distress.     Breath sounds: Normal breath sounds. No wheezing or rales.  Chest:     Chest wall: No tenderness.  Abdominal:  General: There is no distension.     Palpations: Abdomen is soft.     Tenderness: There is no abdominal tenderness.  Musculoskeletal:        General: No swelling. Normal range of motion.     Cervical back: Normal range of motion and neck supple.     Right lower leg: No edema.     Left lower leg: No edema.  Skin:    General: Skin is warm and dry.     Coloration: Skin is not jaundiced or pale.  Neurological:     General: No focal deficit present.     Mental Status: He is alert. He is disoriented.  Psychiatric:        Mood and Affect: Mood normal.        Behavior: Behavior normal.     ED Results / Procedures / Treatments   Labs (all labs ordered are listed, but only abnormal results are displayed) Labs Reviewed  CBC WITH DIFFERENTIAL/PLATELET - Abnormal;  Notable for the following components:      Result Value   WBC 17.4 (*)    Hemoglobin 11.3 (*)    HCT 37.2 (*)    Neutro Abs 15.2 (*)    Monocytes Absolute 1.3 (*)    Abs Immature Granulocytes 0.20 (*)    All other components within normal limits  PROTIME-INR - Abnormal; Notable for the following components:   Prothrombin Time 16.5 (*)    INR 1.3 (*)    All other components within normal limits  COMPREHENSIVE METABOLIC PANEL - Abnormal; Notable for the following components:   CO2 19 (*)    Glucose, Bld 102 (*)    BUN 39 (*)    Creatinine, Ser 3.06 (*)    Calcium 7.6 (*)    Total Protein 5.8 (*)    Albumin 2.1 (*)    AST 75 (*)    GFR, Estimated 21 (*)    All other components within normal limits  LIPID PANEL - Abnormal; Notable for the following components:   HDL 32 (*)    All other components within normal limits  TROPONIN I (HIGH SENSITIVITY) - Abnormal; Notable for the following components:   Troponin I (High Sensitivity) >24,000 (*)    All other components within normal limits  CULTURE, BLOOD (ROUTINE X 2)  CULTURE, BLOOD (ROUTINE X 2)  HEMOGLOBIN A1C  APTT  MAGNESIUM  URINALYSIS, ROUTINE W REFLEX MICROSCOPIC  I-STAT CG4 LACTIC ACID, ED  CBG MONITORING, ED  SAMPLE TO BLOOD BANK    EKG None  Radiology DG Chest Port 1 View  Result Date: 04/20/2023 CLINICAL DATA:  Altered mental status EXAM: PORTABLE CHEST 1 VIEW COMPARISON:  Chest x-ray January 18 2022. Chest CT November 25, 2022 and Apr 18, 2023 FINDINGS: A left pleural effusion is identified. Opacity is identified in the lingula/lower lobe better appreciated on the Apr 18, 2023 CT and probably similar in the interval. This finding is increased compared to the comparison chest x-ray. The right lung remains clear. The cardiomediastinal silhouette is stable. No pneumothorax. IMPRESSION: 1. Opacity in the lingula/lower lobe has increased compared to the prior chest x-ray. This finding is better appreciated on the Apr 18, 2023 CT and probably similar in the interval. 2. A left pleural effusion is identified. Electronically Signed   By: Gerome Sam III M.D.   On: 04/20/2023 17:49   CT Head Wo Contrast  Result Date: 04/20/2023 CLINICAL DATA:  Mental status change. EXAM: CT HEAD WITHOUT  CONTRAST TECHNIQUE: Contiguous axial images were obtained from the base of the skull through the vertex without intravenous contrast. RADIATION DOSE REDUCTION: This exam was performed according to the departmental dose-optimization program which includes automated exposure control, adjustment of the mA and/or kV according to patient size and/or use of iterative reconstruction technique. COMPARISON:  Brain MRI 01/22/2022 FINDINGS: Brain: Within the medial LEFT occipital lobe there is a geographic region of low cortical density along the posterior falx suggesting a LEFT posterior cerebral arterial infarction. Infarcted region measures 5.7 by 2.5 by 2.5 cm (volume = 19 cm^3) Regional gliosis and encephalomalacia in the LEFT frontal lobe is unchanged. No intracranial hemorrhage.  No midline shift or mass effect. Vascular: No hyperdense vessel or unexpected calcification. Skull: Normal. Negative for fracture or focal lesion. Sinuses/Orbits: Extensive thickening of the maxillary sinus walls. There is evidence of prior ethmoid surgery. Mucosal thickening and fluid levels within the paranasal sinuses. Mucosal thickening extends in the frontal sinus Other: None IMPRESSION: 1. Acute or subacute infarction in the LEFT posterior cerebral artery territory (medial LEFT occipital lobe). 2. No intracranial hemorrhage. 3. Chronic LEFT frontal lobe infarction. 4. Acute on chronic sinusitis. Electronically Signed   By: Genevive Bi M.D.   On: 04/20/2023 16:00    Procedures Procedures    Medications Ordered in ED Medications  0.9 %  sodium chloride infusion ( Intravenous Not Given 04/20/23 1653)  nitroGLYCERIN 100 mcg/mL intra-arterial injection (has  no administration in time range)  aspirin chewable tablet 324 mg (324 mg Oral Given 04/20/23 1555)  lactated ringers bolus 1,000 mL (0 mLs Intravenous Stopped 04/20/23 1650)  heparin injection 4,000 Units ( Intravenous MAR Unhold 04/20/23 1625)    ED Course/ Medical Decision Making/ A&P                             Medical Decision Making Amount and/or Complexity of Data Reviewed Labs: ordered. Radiology: ordered.  Risk OTC drugs. Prescription drug management.   This patient presents to the ED for concern of altered mental status, this involves an extensive number of treatment options, and is a complaint that carries with it a high risk of complications and morbidity.  The differential diagnosis includes CVA, ICH, seizure, polypharmacy, infection, metastatic disease, metabolic derangements   Co morbidities that complicate the patient evaluation  GERD, gout, HTN, lung cancer   Additional history obtained:  Additional history obtained from EMS, patient's family External records from outside source obtained and reviewed including EMR   Lab Tests:  I Ordered, and personally interpreted labs.  The pertinent results include: Leukocytosis is present.  Hemoglobin is decreased at baseline.  AKI is present.  Troponin is elevated consistent with STEMI.   Imaging Studies ordered:  I ordered imaging studies including chest x-ray, CT head I independently visualized and interpreted imaging which showed x-ray shows redemonstration of left-sided opacities in pleural effusion.  CT of head shows a acute/subacute left PCA stroke. I agree with the radiologist interpretation   Cardiac Monitoring: / EKG:  The patient was maintained on a cardiac monitor.  I personally viewed and interpreted the cardiac monitored which showed an underlying rhythm of: Sinus rhythm   Consultations Obtained:  I requested consultation with the STEMI doctor, Dr. Clifton James,  and discussed lab and imaging findings as  well as pertinent plan - they recommend: Initial plan for emergent cardiac cath, redirected back to the ED with findings of acute/subacute stroke on CT head. I  requested consultation with the neurologist, Dr. Iver Nestle,  and discussed lab and imaging findings as well as pertinent plan - they recommend: MRI, further recommendations pending I requested consultation with the cardiologist, Dr. Jayme Cloud,  and discussed lab and imaging findings as well as pertinent plan - they recommend: Initiation heparin for medical management of STEMI once neurology gives the okay. I requested consultation with the intensivist, Dr. Chestine Spore,  and discussed lab and imaging findings as well as pertinent plan - they recommend: Admission to ICU   Problem List / ED Course / Critical interventions / Medication management  Patient presenting for altered mental status.  Timeline of this is unknown.  EMS reports that family feels that he has been off for the last couple days but worsened today.  On arrival, patient is oriented only to self.  Patient denies any current complaints.  EMS noted tachycardia, tachypnea, and hypotension prior to arrival.  Blood pressures improved after IV fluids but remained soft.  Additional IV fluids were ordered.  Twelve-lead EKGs by EMS are concerning for lateral ST segment elevations.  This was redemonstrated on EKG in the ED.  Given the very limited history the patient is able to provide in addition to these EKG findings, which are new when compared to prior EKGs, code STEMI was initiated.  Given his altered mental status, I am concerned of intracranial pathology.  Code medical on CT scan of head was ordered prior to initiating heparin.  324 of ASA was ordered.  I spoke with STEMI doctor because her [place]any who activated Cath Lab.  CT scan did not show any acute bleeding.  Heparin was ordered.  Dr. Clifton James evaluated the patient in the ED.  Patient was transported to Cath Lab.  On CT head, there was evidence  of an acute/subacute ischemic stroke in the territory of left PCA.  Because of this finding, patient did not undergo cardiac cath.  He was brought back to the ED.  Neurology was consulted.  I spoke with cardiologist, Dr. Jayme Cloud, who recommends heparin for medical management of STEMI once neurology gives the okay.  Neurology evaluated the patient in the ED and did order MRI studies.  Further recommendations by neurology are pending.  Further lab work shows AKI.  Troponin is markedly elevated consistent with STEMI.  Patient was admitted to ICU for further management. I ordered medication including ASA and heparin for STEMI; IV fluids for hypotension and AKI; Reevaluation of the patient after these medicines showed that the patient stayed the same I have reviewed the patients home medicines and have made adjustments as needed   Social Determinants of Health:  Lives at home with family  CRITICAL CARE Performed by: Gloris Manchester   Total critical care time: 35 minutes  Critical care time was exclusive of separately billable procedures and treating other patients.  Critical care was necessary to treat or prevent imminent or life-threatening deterioration.  Critical care was time spent personally by me on the following activities: development of treatment plan with patient and/or surrogate as well as nursing, discussions with consultants, evaluation of patient's response to treatment, examination of patient, obtaining history from patient or surrogate, ordering and performing treatments and interventions, ordering and review of laboratory studies, ordering and review of radiographic studies, pulse oximetry and re-evaluation of patient's condition.          Final Clinical Impression(s) / ED Diagnoses Final diagnoses:  ST elevation myocardial infarction (STEMI), unspecified artery (HCC)  Cerebrovascular accident (CVA) due to occlusion  of left posterior cerebral artery [facility])  Disorientation     Rx / DC Orders ED Discharge Orders     None         Gloris Manchester, MD 04/20/23 1816

## 2023-04-20 NOTE — H&P (Signed)
NAME:  Peter Diaz, MRN:  [id number], DOB:  1950-12-25, LOS: 0 ADMISSION DATE:  04/20/2023, CONSULTATION DATE:  04/19/2023 REFERRING MD:  Dr. Durwin Nora, CHIEF COMPLAINT:  Stroke/STEMI    History of Present Illness:  Peter Diaz is a 72 y.o. male with a PMH significant for HTN, stage IIIb non-small lung cancer, receiving immunotherapy, gout, and GERD who presented to [facility] 5/11 with AMS, oriented only to self admission..  Of note he was recently treated for drug-induced rash with steroid taper 5 weeks ago.  Per EMS they observed tachycardia, tachypnea, and hypotension with EKG concerning for ST elevation.  Code STEMI was activated and cardiology was called patient was taken to cath lab from ED.  Prior to cath, given AMS on arrival head CT was also obtained and initial read was negative for ICH however acute stroke identified, heart cath cancelled.   On ED arrival patient was seen tachypneic, tachycardic, and mildly hypotensive.  Pertinent lab work included CO2 19, glucose 102, creatinine 3.06, albumin 2.1, AST 75, high-sensitivity troponin greater than 24,000, HLD 32, WBC 17.4, INR 1.2.  Head CT positive for acute or subacute infarction in the left posterior cerebral artery territory, no intracranial hemorrhage.  Given new CVA with likely evolving ACS PCCM consulted for further management and admission  Pertinent  Medical History  HTN,l ung cancer receiving immunotherapy, gout, and GERD  Significant Hospital Events: Including procedures, antibiotic start and stop dates in addition to other pertinent events   5/11 presented with altered mental status code is to be activated, cath canceled with new acute/subacute stroke also identified  Interim History / Subjective:  As above  Objective   Blood pressure 99/67, pulse (!) 108, temperature 97.6 F (36.4 C), temperature source Oral, resp. rate (!) 31, SpO2 98 %.       No intake or output data in the 24 hours ending 04/20/23 1744 There  were no vitals filed for this visit.  Examination: General: Acute on chronically ill appearing elderly male lying in bed in no acute distress HEENT: [place]/AT, MM pink/moist, PERRL,  Neuro: Alert and oriented x 1, nonfocal CV: s1s2 regular rate and rhythm, no murmur, rubs, or gallops,  PULM: Clear to auscultation bilaterally, no increased work of breathing, no added breath sounds, on room air GI: soft, bowel sounds active in all 4 quadrants, non-tender, non-distended,  Extremities: warm/dry, no edema  Skin: no rashes or lesions  Resolved Hospital Problem list     Assessment & Plan:  Acute versus subacute left posterior cerebral artery territory infarct  -Head CT positive for acute or subacute infarction in the left posterior cerebral artery territory, no intracranial hemorrhage.  Acute metabolic encephalopathy -Alert and oriented x 1 on arrival P: Primary management per neurology  Maintain neuro protective measures Nutrition and bowel regiment  Seizure precautions  Aspirations precautions  MRI brain pending Delirium precautions Minimize sedation Correction of metabolic derangements as below  Abnormal EKG concerning for evolving STEMI -EKG with anterior ST elevation, code STEMI activated and initial plan was to proceed with heart cath however procedure canceled when CT possible acute stroke -Initial troponin greater than 24,000 P: Cardiology following, appreciate assistance Continuous telemetry Obtain echocardiogram Strict intake and output Discuss with neuro anticoagulation plan, received 4000 units IV heparin in anticipation for Cath Lab  AKI -Creatinine on admission 3.06 with GFR 21 compared to creatinine 1.38 with GFR 54 03/19/2023 P: Follow renal function  Monitor urine output Trend Bmet Avoid nephrotoxins Ensure adequate  renal perfusion   Stage IIIb non-small cell lung cancer, squamous cell carcinoma -With associated large left upper lobe lung mass and left hilar  and subcarinal lymphadenopathy.  Currently undergoing immunotherapy Concern for postobstructive pneumonia with leukocytosis  Left oral effusion P: Empiric ceftriaxone Minimize sedation Aspiration precautions Monitoring respiratory status closely Will notify oncology of admission in a.m.  Elevated AST  P: Trend LFT   Best Practice (right click and "Reselect all SmartList Selections" daily)   Diet/type: NPO DVT prophylaxis: SCD GI prophylaxis: PPI Lines: N/A Foley:  N/A Code Status:  full code Last date of multidisciplinary goals of care discussion: Discussed with spouse and brother, they state that patient is a full code with all aggressive interventions but if QOL is compromised they would likely change to a comfort approach   Labs   CBC: Recent Labs  Lab 04/20/23 1522  WBC 17.4*  NEUTROABS 15.2*  HGB 11.3*  HCT 37.2*  MCV 86.9  PLT 252    Basic Metabolic Panel: Recent Labs  Lab 04/20/23 1522  NA 136  K 4.6  CL 103  CO2 19*  GLUCOSE 102*  BUN 39*  CREATININE 3.06*  CALCIUM 7.6*  MG 1.9   GFR: CrCl cannot be calculated (Unknown ideal weight.). Recent Labs  Lab 04/20/23 1522  WBC 17.4*    Liver Function Tests: Recent Labs  Lab 04/20/23 1522  AST 75*  ALT 43  ALKPHOS 76  BILITOT 1.2  PROT 5.8*  ALBUMIN 2.1*   No results for input(s): "LIPASE", "AMYLASE" in the last 168 hours. No results for input(s): "AMMONIA" in the last 168 hours.  ABG    Component Value Date/Time   TCO2 23 01/11/2022 0716     Coagulation Profile: Recent Labs  Lab 04/20/23 1522  INR 1.3*    Cardiac Enzymes: No results for input(s): "CKTOTAL", "CKMB", "CKMBINDEX", "TROPONINI" in the last 168 hours.  HbA1C: Hgb A1c MFr Bld  Date/Time Value Ref Range Status  04/20/2023 03:22 PM 5.2 4.8 - 5.6 % Final    Comment:    (NOTE) Pre diabetes:          5.7%-6.4%  Diabetes:              >6.4%  Glycemic control for   <7.0% adults with diabetes     CBG: Recent  Labs  Lab 04/20/23 1638  GLUCAP 93    Review of Systems:   Please see the history of present illness. All other systems reviewed and are negative    Past Medical History:  He,  has a past medical history of GERD (gastroesophageal reflux disease), Gout, History of radiation therapy, and HTN (hypertension).   Surgical History:   Past Surgical History:  Procedure Laterality Date   BIOPSY  01/11/2022   Procedure: BIOPSY;  Surgeon: Josephine Igo, DO;  Location: [facility];  Service: Pulmonary;;   BRAIN SURGERY     BRONCHIAL BRUSHINGS  01/11/2022   Procedure: BRONCHIAL BRUSHINGS;  Surgeon: Josephine Igo, DO;  Location: [facility]OS[facility];  Service: Pulmonary;;   BRONCHIAL NEEDLE ASPIRATION BIOPSY  01/11/2022   Procedure: BRONCHIAL NEEDLE ASPIRATION BIOPSIES;  Surgeon: Josephine Igo, DO;  Location: [facility] ENDOSCOPY;  Service: Pulmonary;;   VIDEO BRONCHOSCOPY WITH ENDOBRONCHIAL ULTRASOUND Left 01/11/2022   Procedure: VIDEO BRONCHOSCOPY WITH ENDOBRONCHIAL ULTRASOUND;  Surgeon: Josephine Igo, DO;  Location: [facility] ENDOSCOPY;  Service: Pulmonary;  Laterality: Left;     Social History:   reports that he quit smoking about 16 months ago.  His smoking use included cigarettes. He smoked an average of .8 packs per day. He has never used smokeless tobacco. He reports current alcohol use. He reports that he does not use drugs.   Family History:  His family history includes Cancer in his sister; Stroke in his father and mother.   Allergies Allergies  Allergen Reactions   Marinol [Dronabinol] Other (See Comments)    "Feels bloated. I don' t ever want to take it "     Home Medications  Prior to Admission medications   Medication Sig Start Date End Date Taking? Authorizing Provider  albuterol (VENTOLIN HFA) 108 (90 Base) MCG/ACT inhaler INHALE 2 PUFFS INTO THE LUNGS EVERY 6 HOURS AS NEEDED FOR WHEEZING OR SHORTNESS OF BREATH 01/29/23   Icard, Elige Radon L, DO  amLODipine (NORVASC) 10 MG tablet Take 10 mg  by mouth daily. 11/21/21   [provider]  esomeprazole (NEXIUM) 20 MG capsule Take 20 mg by mouth daily at 12 noon.    [provider]  ibuprofen (ADVIL) 200 MG tablet Take 200 mg by mouth as needed.    [provider]  mirtazapine (REMERON) 15 MG tablet TAKE 1 TABLET(15 MG) BY MOUTH AT BEDTIME 01/18/23   Heilingoetter, Cassandra L, PA-C  predniSONE (DELTASONE) 10 MG tablet Take 7 tablets (70) mg daily for 2 weeks, then take 5 tablets (50) mg daily for 1 week, followed by 3 tablets (30 mg) daily for 1 week, and then take 1 tablet daily for 7 days. Then stop 03/19/23   Heilingoetter, Cassandra L, PA-C  tamsulosin (FLOMAX) 0.4 MG CAPS capsule Take 0.4 mg by mouth at bedtime. 10/03/21   [provider]     Critical care time:    Performed by: Niklaus Mamaril D. Harris  Total critical care time: 45 minutes  Critical care time was exclusive of separately billable procedures and treating other patients.  Critical care was necessary to treat or prevent imminent or life-threatening deterioration.  Critical care was time spent personally by me on the following activities: development of treatment plan with patient and/or surrogate as well as nursing, discussions with consultants, evaluation of patient's response to treatment, examination of patient, obtaining history from patient or surrogate, ordering and performing treatments and interventions, ordering and review of laboratory studies, ordering and review of radiographic studies, pulse oximetry and re-evaluation of patient's condition.  Marri Mcneff D. Harris, NP-C [facility] Personal contact information can be found on Amion  If no contact or response made please call [phone] 04/20/2023, 6:33 PM

## 2023-04-20 NOTE — Progress Notes (Incomplete)
eLink Physician-Brief Progress Note Patient Name: JAKWAN MINDEL DOB: September 08, 1951 MRN: [id number]   Date of Service  04/20/2023  HPI/Events of Note  -72 year old male with non-small cell lung cancer left frontal lobe abscess 22 years ago, essential hypertension severe debility limiting him to being bedbound who presented to the hospital with acute encephalopathy for several hours found to have a new stroke with concurrently elevated troponins concerning for acute ACS he also has concurrent acute kidney injury.  Overall, patient has multiorgan dysfunction.  Patient was originally tachycardic, tachypneic and hypoxic.  Initial laboratory studies show mild metabolic acidosis, elevated creatinine.  Troponin is above 24,000.  CBC shows leukocytosis.  INR 1.3.  CT head with concern for left PCA infarct.  Chest radiograph with findings similar to previous CT chest.  eICU Interventions  Will likely need antiplatelet agents, defer to neurology.  Continue IV fluids  DVT prophylaxis held GI prophylaxis not indicated   0212 - MR w/ occlusion of L PCA P4.  In the setting with multiorgan dysfunction, he was deemed a poor candidate catheterization by Cards.  Unlikely that he would be a better candidate for IR guided neurointervention  Intervention Category Evaluation Type: New Patient Evaluation  Rovena Hearld 04/20/2023, 7:16 PM

## 2023-04-20 NOTE — Consult Note (Addendum)
Neurology Consultation    Reason for Consult:altered mental status   CC: "I feel great"   HISTORY OF PRESENT ILLNESS   HPI   Peter Diaz a 71 y.o.male with a past medical history of the following:  1. Stage IIIb (T4, N2, M0) non-small cell lung cancer, squamous cell carcinoma presented with large left upper lobe lung mass with left hilar and subcarinal lymphadenopathy diagnosed in February 2023 s/p A course of concurrent chemoradiation with weekly carboplatin for AUC of 2 and paclitaxel 45 Mg/M2.  Status post 6 cycles.  Last dose was given March 04, 2022 with partial response.  He is currently on Consolidation treatment with immunotherapy with Imfinzi 1500 Mg IV every 4 weeks.  First dose Apr 17, 2022.  Status post 12 cycles.   He just oncology 4/10 and is set to undergo his 13th and last cycle.  At this time he was prescribed high-dose steroids due to chemotherapy related rash.  He had a repeat CT chest with contrast 2 days ago but read of this is still pending  2.  Intraparenchymal abscess of left frontal lobe status postevacuation, history per wife is that this was 22 years ago  3.  GERD  4.  Gout  5.  Hypertension  He presents today 04/20/2023 with alteration in mental status, first noted by his wife 2 days ago but with some improvement having been completely at his baseline per his brother all day yesterday.  He has been noted to have strange behaviors, such as lying down on the floor, disoriented to time and situation.  At time of arrival to the emergency department, he was hypotensive and EKG with anterior ST elevation concerning for STEMI.  Heparin bolus was given intention for percutaneous reperfusion; however, head CT revealed a subacute appearing left occipital lobe density concerning for stroke and thus cardiac cath was canceled and neurology consulted.  Pertinent labs/imaging studies reviewed: A1c 5.2 CBC leukocytosis to 17.4 AKI with creatinine from 1.38--> 3.06 and  BUN from 10--> 39 compared to 1 month ago Hypocalcemia from 9.1 to 7.6 compared to last month  New transaminitis  Troponins >24000  Lipid pending  Urine and blood culture pending  CXR pending   CT head:  moderate size left frontal lobe infarction, smaller right frontal lobe infarction, and subcortical right parietal infarction- all chronic appearing  Subacute appearing left occipital lobe infarction   Most recent MRI brain from 01/2022: A. Chronic findings, FLAIR  Left frontal lobe hypodensity with moderate amount of surrounding hyperdense gliosis and ex vacuo dilatation  Right parietal infarction Small vessel disease, scattered  Diffuse atrophy No acute changes    Premorbid modified Rankin scale (mRS):  0-Completely asymptomatic and back to baseline post-stroke  ROS: Unable to obtain reliable ROS due to altered mental status, but he denies vision changes, problems swallowing or with speech, muscle weakness, numbness, tingling, dizziness, or other focal neurological deficits.  PAST MEDICAL HISTORY    Past Medical History:  Past Medical History:  Diagnosis Date   GERD (gastroesophageal reflux disease)    Gout    History of radiation therapy    Left lung- (01/28/22-03/08/22)- Dr. Antony Blackbird   HTN (hypertension)     No family history on file. Family History  Problem Relation Age of Onset   Stroke Mother    Stroke Father    Cancer Sister     Allergies:  Allergies  Allergen Reactions   Marinol [Dronabinol] Other (See Comments)    "Feels bloated.  I don' t ever want to take it "    Social History:   reports that he quit smoking about 16 months ago. His smoking use included cigarettes. He smoked an average of .8 packs per day. He has never used smokeless tobacco. He reports current alcohol use. He reports that he does not use drugs.    Medications (Not in a hospital admission)   EXAMINATION    Current vital signs:    04/20/2023    4:00 PM 04/20/2023    3:45  PM 04/20/2023    3:39 PM  Vitals with BMI  Systolic 97 101 105  Diastolic 67 68 73  Pulse 108 132     Examination:  GENERAL: Awake, alert in NAD HEENT: - Normocephalic and atraumatic, dry mm, no lymphadenopathy, no Thyromegally ABDOMEN - Soft, nontender, nondistended with normoactive BS Ext: Both lower extremities cool to touch, well perfused, intact peripheral pulses, no pedal edema +areas of ecchymosis all limbs , worst RUE Psychiatric: Disinhibited, inappropriately unconcerned  NEURO:  Mental Status: AA&Ox1 to person only, states it is 1955 and that author's phone is a tape recorder , also states author's fist is a tape recorder Language: speech is mildly dysarthric. Impaired naming, in tact repetition, in tact fluency, and in tact comprehension. Cranial Nerves:  II: PERRL. Does not blink to threat bilaterally III, IV, VI: EOM in tact, but does not track. Eyelids elevate symmetrically.  V: Sensation intact V1-3 symmetrically  VII: no facial asymmetry   VIII: hearing intact to voice IX, X: Palate elevates symmetrically. Phonation is normal.  ZO:[id number] shrug 5/5 and symmetrical  XII: tongue is midline without fasciculations. Motor: 5/5 throughout  Reflexes: 3+ left hemibody, 2+ right  Upgoing toes on left , negative Hoffman's  Sensation: in tact throughout Tone: is normal and bulk is normal Coordination: unable to assess FTN (does not appear to be able to see finger, but attempts to reach out in front of himself) ,  no ataxia in BLE. + intermittent myoclonus, mainly in Ues, action related Gait- deferred   On later attending evaluation, he is able to reliably count fingers in the left visual field and perform finger-to-nose on the left side but not on the right side  NIHSS Orientation 1  1 dysarthria 3 visual fields 1 aphasia (although limited by cortical blindness) +extinction , but not consistently one same side  Total 7  LABS   I have reviewed labs in epic and the  results pertinent to this consultation are:   No results found for: "[facility]" Lab Results  Component Value Date   ALT 43 04/20/2023   AST 75 (H) 04/20/2023   ALKPHOS 76 04/20/2023   BILITOT 1.2 04/20/2023   Lab Results  Component Value Date   HGBA1C 5.2 04/20/2023   Lab Results  Component Value Date   WBC 17.4 (H) 04/20/2023   HGB 11.3 (L) 04/20/2023   HCT 37.2 (L) 04/20/2023   MCV 86.9 04/20/2023   PLT 252 04/20/2023   No results found for: "VITAMINB12" No results found for: "FOLATE" Lab Results  Component Value Date   NA 136 04/20/2023   K 4.6 04/20/2023   CL 103 04/20/2023   CO2 19 (L) 04/20/2023   DIAGNOSTIC IMAGING/PROCEDURES  CT head:  moderate size left frontal lobe infarction, smaller right frontal lobe infarction, and subcortical right parietal infarction- all chronic appearing  Subacute appearing left occipital lobe infarction   Most recent MRI brain from 01/2022: A. Chronic findings, FLAIR  Left  frontal lobe hypodensity with moderate amount of surrounding hyperdense gliosis and ex vacuo dilatation  Right parietal infarction Small vessel disease, scattered  Diffuse atrophy No acute changes   ASSESSMENT/PLAN    Assessment:  This is a pleasant 72 year old gentleman undergoing his last chemotherapy treatmentFor stage IIIb non-small cell lung cancer, prior left frontal lobe intraparenchymal abscess status post evacuation 22 years ago, GERD, gout, and hypertension who is presenting with alteration in mental status x 1-2 days.  Initial concern for NSTEMI based on EKG and troponinemia at time of arrival for which she was given heparin bolus with plans for PCI with cardiology.  However, head CT revealed hypodensity in left occipital lobe concerning for subacute ischemic infarction for which neurology is consulted.  On examination, there are clear visual field deficits, although they seemed to be bilateral on PA exam, unilateral within the expected distribution of  the left PCA stroke on attending exam. Fluctuating symptoms may be secondary to hypoperfusion secondary to his cardiac status, poor attention/concentration due to delirium, or focal seizure  However, with multifocal myoclonus and generalized encephalopathy as the mainstay of his presentation, more likely we are dealing with a toxic metabolic encephalopathy which is multifactorial.  Occipital lobe lesion alone would not explain his presentation fully. Other differentials include seizures or other mass lesions intracranially.   Impression: # Toxic metabolic encephalopathy, multifactorial Factors include visual field deficit from left occipital lobe lesion, AKI, acute myocardial infarction, and possibly occult infection with leukocytosis  # Left occipital lobe lesion This does appear to adhere to vascular territory, making subacute ischemic infarct likely diagnosis.  However, in this patient with history of active malignancy, cannot rule out metastatic lesion and this lesion alone would not explain abnormal left sided reflexes.   Recommendations: -MRI brain with and without contrast for better characterization -MRA head and neck  -Routine EEG -Heparin low goal no bolus protocol, plan for 48 hours per cardiology -Okay to initiate antiplatelet therapy per cardiology, with disclosure to family of intracerebral hemorrhage risk; in discussion with cardiology will start aspirin monotherapy with addition of Plavix potentially after completing course of heparin if patient is tolerating aspirin plus heparin -Will likely also require echocardiogram as part of stroke/MI workup -Hemoglobin A1c within normal limits, lipid panel pending -Continued infectious workup per primary team -Stroke team to follow  Discussed with Dr. Iver Nestle. Final recommendations pending her attestation of this note.  -- Sanjuana Letters, PA-C Neurology Pager # [phone]   **This documentation was dictated using Dragon Medical  Software and may contain inadvertent errors **   Attending Neurologist's note:  I personally saw this patient, gathering history, performing a full neurologic examination, reviewing relevant labs, personally reviewing relevant imaging including head CT, and formulated the assessment and plan, adding the note above for completeness and clarity to accurately reflect my thoughts.  Discussed with cardiology via phone and secure chat, ED provider via phone and secure chat, and discussed with family at bedside (brother)   CRITICAL CARE Performed by: Gordy Councilman   Total critical care time: 50 minutes  Critical care time was exclusive of separately billable procedures and treating other patients.  Critical care was necessary to treat or prevent imminent or life-threatening deterioration.  Critical care was time spent personally by me on the following activities: development of treatment plan with patient and/or surrogate as well as nursing, discussions with consultants, evaluation of patient's response to treatment, examination of patient, obtaining history from patient or surrogate, ordering and performing treatments and  interventions, ordering and review of laboratory studies, ordering and review of radiographic studies, pulse oximetry and re-evaluation of patient's condition.

## 2023-04-20 NOTE — ED Notes (Signed)
Attempted to call report to [facility] Nurse not able to take report at this time.

## 2023-04-21 ENCOUNTER — Inpatient Hospital Stay: Payer: PPO | Admitting: Internal Medicine

## 2023-04-21 ENCOUNTER — Inpatient Hospital Stay: Payer: PPO | Attending: Radiation Oncology

## 2023-04-21 ENCOUNTER — Inpatient Hospital Stay (HOSPITAL_COMMUNITY): Payer: PPO

## 2023-04-21 DIAGNOSIS — I63532 Cerebral infarction due to unspecified occlusion or stenosis of left posterior cerebral artery: Secondary | ICD-10-CM | POA: Diagnosis not present

## 2023-04-21 DIAGNOSIS — G9340 Encephalopathy, unspecified: Secondary | ICD-10-CM

## 2023-04-21 DIAGNOSIS — R9431 Abnormal electrocardiogram [ECG] [EKG]: Secondary | ICD-10-CM

## 2023-04-21 DIAGNOSIS — I213 ST elevation (STEMI) myocardial infarction of unspecified site: Secondary | ICD-10-CM | POA: Diagnosis not present

## 2023-04-21 DIAGNOSIS — I639 Cerebral infarction, unspecified: Secondary | ICD-10-CM

## 2023-04-21 DIAGNOSIS — I2102 ST elevation (STEMI) myocardial infarction involving left anterior descending coronary artery: Secondary | ICD-10-CM | POA: Diagnosis not present

## 2023-04-21 DIAGNOSIS — J9601 Acute respiratory failure with hypoxia: Secondary | ICD-10-CM

## 2023-04-21 DIAGNOSIS — Z7189 Other specified counseling: Secondary | ICD-10-CM

## 2023-04-21 DIAGNOSIS — Z515 Encounter for palliative care: Secondary | ICD-10-CM

## 2023-04-21 DIAGNOSIS — I6340 Cerebral infarction due to embolism of unspecified cerebral artery: Secondary | ICD-10-CM

## 2023-04-21 DIAGNOSIS — N179 Acute kidney failure, unspecified: Secondary | ICD-10-CM | POA: Diagnosis not present

## 2023-04-21 LAB — CBC
HCT: 38.3 % — ABNORMAL LOW (ref 39.0–52.0)
Hemoglobin: 11.8 g/dL — ABNORMAL LOW (ref 13.0–17.0)
MCH: 26.1 pg (ref 26.0–34.0)
MCHC: 30.8 g/dL (ref 30.0–36.0)
MCV: 84.7 fL (ref 80.0–100.0)
Platelets: 279 10*3/uL (ref 150–400)
RBC: 4.52 MIL/uL (ref 4.22–5.81)
RDW: 15.6 % — ABNORMAL HIGH (ref 11.5–15.5)
WBC: 14.5 10*3/uL — ABNORMAL HIGH (ref 4.0–10.5)
nRBC: 0 % (ref 0.0–0.2)

## 2023-04-21 LAB — BASIC METABOLIC PANEL
Anion gap: 14 (ref 5–15)
BUN: 40 mg/dL — ABNORMAL HIGH (ref 8–23)
CO2: 21 mmol/L — ABNORMAL LOW (ref 22–32)
Calcium: 7.9 mg/dL — ABNORMAL LOW (ref 8.9–10.3)
Chloride: 101 mmol/L (ref 98–111)
Creatinine, Ser: 2.61 mg/dL — ABNORMAL HIGH (ref 0.61–1.24)
GFR, Estimated: 25 mL/min — ABNORMAL LOW (ref >60)
Glucose, Bld: 77 mg/dL (ref 70–99)
Potassium: 4.1 mmol/L (ref 3.5–5.1)
Sodium: 136 mmol/L (ref 135–145)

## 2023-04-21 LAB — MAGNESIUM: Magnesium: 2.1 mg/dL (ref 1.7–2.4)

## 2023-04-21 LAB — ECHOCARDIOGRAM COMPLETE
AR max vel: 3.12 cm2
AV Area VTI: 3 cm2
AV Area mean vel: 2.88 cm2
AV Mean grad: 2 mmHg
AV Peak grad: 4.1 mmHg
Ao pk vel: 1.01 m/s
Area-P 1/2: 10.92 cm2
Height: 67.992 in
MV M vel: 4 m/s
MV Peak grad: 64 mmHg
MV VTI: 0.51 cm2
Radius: 0.7 cm
S' Lateral: 4.8 cm
Single Plane A2C EF: 27.6 %
Single Plane A4C EF: 39.6 %
Weight: 2716.07 oz

## 2023-04-21 LAB — PHOSPHORUS: Phosphorus: 4.4 mg/dL (ref 2.5–4.6)

## 2023-04-21 LAB — LACTIC ACID, PLASMA: Lactic Acid, Venous: 1.4 mmol/L (ref 0.5–1.9)

## 2023-04-21 LAB — HEPARIN LEVEL (UNFRACTIONATED): Heparin Unfractionated: <0.1 IU/mL — ABNORMAL LOW (ref 0.30–0.70)

## 2023-04-21 LAB — HEMOGLOBIN AND HEMATOCRIT, BLOOD
HCT: 37.4 % — ABNORMAL LOW (ref 39.0–52.0)
Hemoglobin: 11.7 g/dL — ABNORMAL LOW (ref 13.0–17.0)

## 2023-04-21 LAB — CULTURE, BLOOD (ROUTINE X 2): Special Requests: ADEQUATE

## 2023-04-21 MED ORDER — DAPAGLIFLOZIN PROPANEDIOL 5 MG PO TABS
10.0000 mg | ORAL_TABLET | Freq: Every day | ORAL | Status: DC
  Administered 2023-04-21 – 2023-04-22 (×2): 10 mg via ORAL
  Filled 2023-04-21 (×2): qty 2

## 2023-04-21 MED ORDER — ORAL CARE MOUTH RINSE: 15.0000 mL | OROMUCOSAL | Status: DC | PRN

## 2023-04-21 MED ORDER — HEPARIN (PORCINE) 25000 UT/250ML-% IV SOLN
1150.0000 [IU]/h | INTRAVENOUS | Status: DC
  Administered 2023-04-21: 800 [IU]/h via INTRAVENOUS
  Administered 2023-04-22: 1150 [IU]/h via INTRAVENOUS
  Filled 2023-04-21 (×2): qty 250

## 2023-04-21 MED ORDER — GADOBUTROL 1 MMOL/ML IV SOLN
7.5000 mL | Freq: Once | INTRAVENOUS | Status: AC | PRN
  Administered 2023-04-21: 7.5 mL via INTRAVENOUS

## 2023-04-21 MED ORDER — PERFLUTREN LIPID MICROSPHERE
1.0000 mL | INTRAVENOUS | Status: DC | PRN
  Administered 2023-04-21: 2 mL via INTRAVENOUS

## 2023-04-21 MED ORDER — ROSUVASTATIN CALCIUM 20 MG PO TABS
20.0000 mg | ORAL_TABLET | Freq: Every day | ORAL | Status: DC
  Administered 2023-04-21 – 2023-04-22 (×2): 20 mg via ORAL
  Filled 2023-04-21 (×2): qty 1

## 2023-04-21 MED ORDER — IOHEXOL 9 MG/ML PO SOLN
500.0000 mL | ORAL | Status: AC
  Administered 2023-04-21 (×2): 500 mL via ORAL

## 2023-04-21 MED ORDER — ASPIRIN 81 MG PO TBEC
81.0000 mg | DELAYED_RELEASE_TABLET | Freq: Every day | ORAL | Status: DC
  Administered 2023-04-21: 81 mg via ORAL
  Filled 2023-04-21: qty 1

## 2023-04-21 MED ORDER — CARVEDILOL 3.125 MG PO TABS
3.1250 mg | ORAL_TABLET | Freq: Two times a day (BID) | ORAL | Status: DC
  Administered 2023-04-21: 3.125 mg via ORAL
  Filled 2023-04-21: qty 1

## 2023-04-21 MED ORDER — CLOPIDOGREL BISULFATE 75 MG PO TABS: 75.0000 mg | ORAL_TABLET | Freq: Every day | ORAL | Status: DC

## 2023-04-21 MED ORDER — TRAZODONE HCL 50 MG PO TABS
100.0000 mg | ORAL_TABLET | Freq: Every evening | ORAL | Status: DC | PRN
  Administered 2023-04-21: 100 mg via ORAL
  Filled 2023-04-21: qty 2

## 2023-04-21 MED ORDER — FUROSEMIDE 10 MG/ML IJ SOLN
40.0000 mg | Freq: Once | INTRAMUSCULAR | Status: AC
  Administered 2023-04-21: 40 mg via INTRAVENOUS
  Filled 2023-04-21: qty 4

## 2023-04-21 MED ORDER — ORAL CARE MOUTH RINSE
15.0000 mL | OROMUCOSAL | Status: DC
  Administered 2023-04-22 – 2023-04-24 (×8): 15 mL via OROMUCOSAL

## 2023-04-21 MED ORDER — SODIUM CHLORIDE 0.9 % IV SOLN
2.0000 g | INTRAVENOUS | Status: DC
  Administered 2023-04-21 – 2023-04-22 (×2): 2 g via INTRAVENOUS
  Filled 2023-04-21 (×2): qty 20

## 2023-04-21 NOTE — Progress Notes (Addendum)
STROKE TEAM PROGRESS NOTE   INTERVAL HISTORY  Evaluated patient at bedside with family present. He remains slightly confused on exam. States that he felt fine before he was brought in. Wife and daughter at bedside, state that he was confused and was laying on the floor. He did not have any chest pain but was found to have a STEMI on arrival, unclear duration. Also found to have a larger acute infarct of L PCA territory and multiple smaller strokes in the R frontal lobe and R occipital lobe, likely cardioembolic in nature.   He is on IV heparin and ASA at this time given STEMI. Reached out to oncology, Dr. Myna Hidalgo, who recommends CT abd/pel to check for metastases from lung cancer. Also recommends eliquis therapy moving forward for [facility].   Vitals:   04/21/23 0900 04/21/23 1000 04/21/23 1100 04/21/23 1200  BP: 105/73  105/77 111/69  Pulse: (!) 117  (!) 108 (!) 111  Resp: (!) 31 (!) 32 19 (!) 31  Temp:      TempSrc:      SpO2: (!) 88%  100% 100%  Weight:      Height:       CBC:  Recent Labs  Lab 04/20/23 1522 04/21/23 0137  WBC 17.4* 14.5*  NEUTROABS 15.2*  --   HGB 11.3* 11.8*  HCT 37.2* 38.3*  MCV 86.9 84.7  PLT 252 279   Basic Metabolic Panel:  Recent Labs  Lab 04/20/23 1522 04/21/23 0137  NA 136 136  K 4.6 4.1  CL 103 101  CO2 19* 21*  GLUCOSE 102* 77  BUN 39* 40*  CREATININE 3.06* 2.61*  CALCIUM 7.6* 7.9*  MG 1.9 2.1  PHOS  --  4.4   Lipid Panel:  Recent Labs  Lab 04/20/23 1522  CHOL 100  TRIG 69  HDL 32*  CHOLHDL 3.1  VLDL 14  LDLCALC 54   HgbA1c:  Recent Labs  Lab 04/20/23 1522  HGBA1C 5.2   Urine Drug Screen: No results for input(s): "LABOPIA", "COCAINSCRNUR", "LABBENZ", "AMPHETMU", "THCU", "LABBARB" in the last 168 hours.  Alcohol Level No results for input(s): "ETH" in the last 168 hours.  IMAGING past 24 hours MR BRAIN W WO CONTRAST  Result Date: 04/21/2023 CLINICAL DATA:  Acute neurologic deficit EXAM: MRI HEAD WITHOUT AND WITH CONTRAST MRA  HEAD WITHOUT CONTRAST MRA NECK WITHOUT CONTRAST TECHNIQUE: Multiplanar, multiecho pulse sequences of the brain and surrounding structures were obtained without and with intravenous contrast. Angiographic images of the Circle of Willis were obtained using MRA technique without intravenous contrast. Angiographic images of the neck were obtained using MRA technique without intravenous contrast. Carotid stenosis measurements (when applicable) are obtained utilizing NASCET criteria, using the distal internal carotid diameter as the denominator. CONTRAST:  7.54mL GADAVIST GADOBUTROL 1 MMOL/ML IV SOLN COMPARISON:  None Available. FINDINGS: MRI HEAD FINDINGS Brain: Acute infarct of the left PCA territory involving the left occipital and medial temporal lobes. Multiple punctate foci of acute ischemia within the right frontal lobe and right occipital lobe. Old left frontal and right parietal infarcts. There is multifocal hyperintense T2-weighted signal within the white matter. Generalized volume loss. The midline structures are normal. There is no abnormal contrast enhancement. Vascular: Major flow voids are preserved. Skull and upper cervical spine: Normal calvarium and skull base. Visualized upper cervical spine and soft tissues are normal. Sinuses/Orbits:Opacified sphenoid sinuses.  Normal orbits. MRA HEAD FINDINGS POSTERIOR CIRCULATION: --Vertebral arteries: Normal --Inferior cerebellar arteries: Normal. --Basilar artery: Normal. --Superior cerebellar arteries:  Normal. --Posterior cerebral arteries: Moderate stenosis of the distal P2 segment of the right PCA. Occlusion of the left PCA P4 segment. ANTERIOR CIRCULATION: --Intracranial internal carotid arteries: Normal. --Anterior cerebral arteries (ACA): Normal. --Middle cerebral arteries (MCA): Normal. MRA NECK FINDINGS Motion degraded time-of-flight imaging of the carotid and vertebral systems. Postcontrast imaging could not be obtained due to software failure. The visible  portions of the carotid and vertebral arteries are normal. The proximal vessels are not visualized. IMPRESSION: 1. Acute infarct of the left PCA territory involving the left occipital and medial temporal lobes. No hemorrhage or mass effect. 2. Multiple punctate foci of acute ischemia within the right frontal lobe and right occipital lobe. 3. Occlusion of the left PCA P4 segment. Moderate stenosis of the distal P2 segment of the right PCA. 4. No visible occlusion in the neck. Electronically Signed   By: Deatra Robinson M.D.   On: 04/21/2023 01:33   MR ANGIO HEAD WO CONTRAST  Result Date: 04/21/2023 CLINICAL DATA:  Acute neurologic deficit EXAM: MRI HEAD WITHOUT AND WITH CONTRAST MRA HEAD WITHOUT CONTRAST MRA NECK WITHOUT CONTRAST TECHNIQUE: Multiplanar, multiecho pulse sequences of the brain and surrounding structures were obtained without and with intravenous contrast. Angiographic images of the Circle of Willis were obtained using MRA technique without intravenous contrast. Angiographic images of the neck were obtained using MRA technique without intravenous contrast. Carotid stenosis measurements (when applicable) are obtained utilizing NASCET criteria, using the distal internal carotid diameter as the denominator. CONTRAST:  7.47mL GADAVIST GADOBUTROL 1 MMOL/ML IV SOLN COMPARISON:  None Available. FINDINGS: MRI HEAD FINDINGS Brain: Acute infarct of the left PCA territory involving the left occipital and medial temporal lobes. Multiple punctate foci of acute ischemia within the right frontal lobe and right occipital lobe. Old left frontal and right parietal infarcts. There is multifocal hyperintense T2-weighted signal within the white matter. Generalized volume loss. The midline structures are normal. There is no abnormal contrast enhancement. Vascular: Major flow voids are preserved. Skull and upper cervical spine: Normal calvarium and skull base. Visualized upper cervical spine and soft tissues are normal.  Sinuses/Orbits:Opacified sphenoid sinuses.  Normal orbits. MRA HEAD FINDINGS POSTERIOR CIRCULATION: --Vertebral arteries: Normal --Inferior cerebellar arteries: Normal. --Basilar artery: Normal. --Superior cerebellar arteries: Normal. --Posterior cerebral arteries: Moderate stenosis of the distal P2 segment of the right PCA. Occlusion of the left PCA P4 segment. ANTERIOR CIRCULATION: --Intracranial internal carotid arteries: Normal. --Anterior cerebral arteries (ACA): Normal. --Middle cerebral arteries (MCA): Normal. MRA NECK FINDINGS Motion degraded time-of-flight imaging of the carotid and vertebral systems. Postcontrast imaging could not be obtained due to software failure. The visible portions of the carotid and vertebral arteries are normal. The proximal vessels are not visualized. IMPRESSION: 1. Acute infarct of the left PCA territory involving the left occipital and medial temporal lobes. No hemorrhage or mass effect. 2. Multiple punctate foci of acute ischemia within the right frontal lobe and right occipital lobe. 3. Occlusion of the left PCA P4 segment. Moderate stenosis of the distal P2 segment of the right PCA. 4. No visible occlusion in the neck. Electronically Signed   By: Deatra Robinson M.D.   On: 04/21/2023 01:33   MR ANGIO NECK W WO CONTRAST  Result Date: 04/21/2023 CLINICAL DATA:  Acute neurologic deficit EXAM: MRI HEAD WITHOUT AND WITH CONTRAST MRA HEAD WITHOUT CONTRAST MRA NECK WITHOUT CONTRAST TECHNIQUE: Multiplanar, multiecho pulse sequences of the brain and surrounding structures were obtained without and with intravenous contrast. Angiographic images of the Circle of Willis were  obtained using MRA technique without intravenous contrast. Angiographic images of the neck were obtained using MRA technique without intravenous contrast. Carotid stenosis measurements (when applicable) are obtained utilizing NASCET criteria, using the distal internal carotid diameter as the denominator. CONTRAST:   7.69mL GADAVIST GADOBUTROL 1 MMOL/ML IV SOLN COMPARISON:  None Available. FINDINGS: MRI HEAD FINDINGS Brain: Acute infarct of the left PCA territory involving the left occipital and medial temporal lobes. Multiple punctate foci of acute ischemia within the right frontal lobe and right occipital lobe. Old left frontal and right parietal infarcts. There is multifocal hyperintense T2-weighted signal within the white matter. Generalized volume loss. The midline structures are normal. There is no abnormal contrast enhancement. Vascular: Major flow voids are preserved. Skull and upper cervical spine: Normal calvarium and skull base. Visualized upper cervical spine and soft tissues are normal. Sinuses/Orbits:Opacified sphenoid sinuses.  Normal orbits. MRA HEAD FINDINGS POSTERIOR CIRCULATION: --Vertebral arteries: Normal --Inferior cerebellar arteries: Normal. --Basilar artery: Normal. --Superior cerebellar arteries: Normal. --Posterior cerebral arteries: Moderate stenosis of the distal P2 segment of the right PCA. Occlusion of the left PCA P4 segment. ANTERIOR CIRCULATION: --Intracranial internal carotid arteries: Normal. --Anterior cerebral arteries (ACA): Normal. --Middle cerebral arteries (MCA): Normal. MRA NECK FINDINGS Motion degraded time-of-flight imaging of the carotid and vertebral systems. Postcontrast imaging could not be obtained due to software failure. The visible portions of the carotid and vertebral arteries are normal. The proximal vessels are not visualized. IMPRESSION: 1. Acute infarct of the left PCA territory involving the left occipital and medial temporal lobes. No hemorrhage or mass effect. 2. Multiple punctate foci of acute ischemia within the right frontal lobe and right occipital lobe. 3. Occlusion of the left PCA P4 segment. Moderate stenosis of the distal P2 segment of the right PCA. 4. No visible occlusion in the neck. Electronically Signed   By: Deatra Robinson M.D.   On: 04/21/2023 01:33   US  RENAL  Result Date: 04/20/2023 CLINICAL DATA:  Acute kidney injury. EXAM: RENAL / URINARY TRACT ULTRASOUND COMPLETE COMPARISON:  PET-CT 01/08/2022 FINDINGS: Right Kidney: Renal measurements: 10.6 x 4.7 x 4.1 cm = volume: 106 mL. Echogenicity within normal limits. No mass or hydronephrosis visualized. Left Kidney: Renal measurements: 9.4 x 4.7 x 5.0 = volume: 116 mL. Echogenicity within normal limits. No mass or hydronephrosis visualized. Bladder: Urinary bladder was empty and not well visualized. Other: None. IMPRESSION: Normal sonographic evaluation of the kidneys. Urinary bladder was empty and not well visualized. Electronically Signed   By: Sherron Ales M.D.   On: 04/20/2023 19:27   DG Chest Port 1 View  Result Date: 04/20/2023 CLINICAL DATA:  Altered mental status EXAM: PORTABLE CHEST 1 VIEW COMPARISON:  Chest x-ray January 18 2022. Chest CT November 25, 2022 and Apr 18, 2023 FINDINGS: A left pleural effusion is identified. Opacity is identified in the lingula/lower lobe better appreciated on the Apr 18, 2023 CT and probably similar in the interval. This finding is increased compared to the comparison chest x-ray. The right lung remains clear. The cardiomediastinal silhouette is stable. No pneumothorax. IMPRESSION: 1. Opacity in the lingula/lower lobe has increased compared to the prior chest x-ray. This finding is better appreciated on the Apr 18, 2023 CT and probably similar in the interval. 2. A left pleural effusion is identified. Electronically Signed   By: Gerome Sam III M.D.   On: 04/20/2023 17:49   CT Head Wo Contrast  Result Date: 04/20/2023 CLINICAL DATA:  Mental status change. EXAM: CT HEAD WITHOUT CONTRAST  TECHNIQUE: Contiguous axial images were obtained from the base of the skull through the vertex without intravenous contrast. RADIATION DOSE REDUCTION: This exam was performed according to the departmental dose-optimization program which includes automated exposure control,  adjustment of the mA and/or kV according to patient size and/or use of iterative reconstruction technique. COMPARISON:  Brain MRI 01/22/2022 FINDINGS: Brain: Within the medial LEFT occipital lobe there is a geographic region of low cortical density along the posterior falx suggesting a LEFT posterior cerebral arterial infarction. Infarcted region measures 5.7 by 2.5 by 2.5 cm (volume = 19 cm^3) Regional gliosis and encephalomalacia in the LEFT frontal lobe is unchanged. No intracranial hemorrhage.  No midline shift or mass effect. Vascular: No hyperdense vessel or unexpected calcification. Skull: Normal. Negative for fracture or focal lesion. Sinuses/Orbits: Extensive thickening of the maxillary sinus walls. There is evidence of prior ethmoid surgery. Mucosal thickening and fluid levels within the paranasal sinuses. Mucosal thickening extends in the frontal sinus Other: None IMPRESSION: 1. Acute or subacute infarction in the LEFT posterior cerebral artery territory (medial LEFT occipital lobe). 2. No intracranial hemorrhage. 3. Chronic LEFT frontal lobe infarction. 4. Acute on chronic sinusitis. Electronically Signed   By: Genevive Bi M.D.   On: 04/20/2023 16:00    PHYSICAL EXAM General: elderly male, laying in bed, NAD. Head: [place]/AT CV: normal rate and regular rhythm, no m/r/g. Pulm: normal WOB on RA. Skin: warm and dry. Psych: calm but not fully cooperative with exam  Neuro: Mental Status: remains confused on exam. Able to speak legibly, no dysarthria. Perseverates on questioning. Appears to have R hemineglect. Able to follow one-step commands but difficulty with multi-step commands.  Cranial Nerves: II: PERRL. Appears to have R hemianopsia, but does not cooperate well with visual field testing.  III, IV, VI: EOMI but with left gaze preference. No nystagmus noted. V: facial sensation symmetric VII: no obvious facial droop VIII: hearing is intact to voice. XI: shoulder shrug  symmetric  Motor: 5/5 strength bilaterally throughout.   Sensation: difficult to assess as he is uncooperative. Appears intact in upper extremities. Has difficulty with sensation in bilateral lower extremities to light touch (appears to be guessing when blinded).  Coordination: does not cooperate well with exam but grossly intact.  Gait: deferred for patient's safety.   ASSESSMENT/PLAN Mr. NIM GELWICKS is a 72 y.o. male with history of stage IIIb non-small cell lung cancer, prior left frontal lobe intraparenchymal abscess s/p evacuation 22 years ago, HTN presenting with STEMI and multiple acute infarcts.   Stroke - Acute infarct of L PCA and multiple bilateral punctate infarcts, etiology:  likely 2/2 hypercoagulable state from advanced NSCLC vs. cardioembolic from STEMI and cardiomyopathy with low EF Code Stroke CT head with acute infarction in L posterior cerebral artery territory MRI  - acute infarct of L PCA territory involving L occipital and medial temporal lobes. Multiple punctate foci of acute ischemia within R frontal lobe and R occipital lobe.  MRA head and neck - occlusion of left PCA P4 segment. Moderate stenosis of distal P2 segment of R PCA.  2D Echo - LVEF 25-30%, akinesis of anteroseptal wall and apex with overall severe LV dysfunction. No LV thrombus. Normal LA size and no interatrial shunt detected. LE venous doppler - chronic right femoral vein DVT.  LDL 54 HgbA1c 5.2 VTE prophylaxis - IV heparin (for STEMI) No antithrombotic prior to admission, now on aspirin 81 mg daily and heparin IV. Pan for eliquis once off heparin IV. Will also  need to be on plavix per cardiology. Therapy recommendations:  CIR Disposition:  pending  STEMI Cardiomyopathy  acute anteroseptal infarct, unclear duration CHO with akinesis of anteroseptal wall and apex with overall severe LV dysfunction, EF 25-30% IV heparin until tomorrow, then transition to eliquis on ASA 81mg  daily, will  transition to plavix 75mg  daily starting tomorrow cardiology following, appreciate management  NSCLC Stage IIIB large LUL mass S/p chemo, Rx, and steroids Following with Dr. Shirline Frees at [facility] CT chest 5/10 No evidence of recurrent or metastatic disease.  Discussed with Dr. Myna Hidalgo  Will check CT abd/pelvis LE venous doppler - chronic right femoral vein DVT.  Consider eliquis after heparin IV x 48h for STEMI  Hypertension Home meds:  norvasc 10mg  daily Stable Long-term BP goal normotensive  Hyperlipidemia Home meds:  none LDL 54, goal < 70  High intensity statin not indicated given LDL at goal  Other Stroke Risk Factors Advanced Age >/= 48  Coronary artery disease  Other Active Problems Leukocytosis WBC 17.4 AKI Cre 3.06  Hospital day # 1    ATTENDING NOTE: I reviewed above note and agree with the assessment and plan. Pt was seen and examined.   Wife and daughter at bedside.  Patient lying bed, eyes open, awake alert, not fully orientated, perseveration with difficulty exam.  Able to follow one-step, and consistently but not two-step commands.  Left gaze preference, seem to have right hemianopia.  Otherwise no significant focal neurologic deficit.  CT and MRI showed left PCA large infarct as well as punctate bilateral anterior and posterior infarcts, consistent with embolic source.  Etiology could be STEMI related with low EF cardiomyopathy versus hypercoag state due to advanced lung cancer.  However, recent CT chest showed no recurrence or metastasis.  Discussed with oncology, recommend CT abdomen pelvis.  And also recommend Eliquis after 48 hours of heparin IV for STEMI.  Discussed with cardiology, given low EF, Dr. [place] in agreement with Eliquis after 48 hours of heparin IV, also recommend switch aspirin to Plavix for further STEMI management.  Continue BMP and CBC monitoring for leukocytosis and AKI.  Appreciate CCM management.  PT and OT recommend CIR.  For detailed  assessment and plan, please refer to above/below as I have made changes wherever appropriate.   This patient is critically ill due to embolic stroke, STEMI, cardiomyopathy with low EF, lung cancer and at significant risk of neurological worsening, death form recurrent stroke, hemorrhagic transformation, heart failure. This patient's care requires constant monitoring of vital signs, hemodynamics, respiratory and cardiac monitoring, review of multiple databases, neurological assessment, discussion with family, other specialists and medical decision making of high complexity. I spent 40 minutes of neurocritical care time in the care of this patient. I had long discussion with wife and daughter at bedside, updated pt current condition, treatment plan and potential prognosis, and answered all the questions.  They expressed understanding and appreciation.    Marvel Plan, MD PhD Stroke Neurology 04/21/2023 5:36 PM    To contact Stroke Continuity provider, please refer to [email or web address]. After hours, contact General Neurology

## 2023-04-21 NOTE — Progress Notes (Addendum)
NAME:  Peter Diaz, MRN:  [id number], DOB:  03/09/1951, LOS: 1 ADMISSION DATE:  04/20/2023, CONSULTATION DATE:  04/19/2023 REFERRING MD:  Dr. Durwin Nora, CHIEF COMPLAINT:  Stroke/STEMI    History of Present Illness:  Peter Diaz is a 72 y.o. male with a PMH significant for HTN, stage IIIb non-small lung cancer, receiving immunotherapy, gout, and GERD who presented to [facility] 5/11 with AMS, oriented only to self admission..  Of note he was recently treated for drug-induced rash with steroid taper 5 weeks ago.  Per EMS they observed tachycardia, tachypnea, and hypotension with EKG concerning for ST elevation.  Code STEMI was activated and cardiology was called patient was taken to cath lab from ED.  Prior to cath, given AMS on arrival head CT was also obtained and initial read was negative for ICH however acute stroke identified, heart cath cancelled.   On ED arrival patient was seen tachypneic, tachycardic, and mildly hypotensive.  Pertinent lab work included CO2 19, glucose 102, creatinine 3.06, albumin 2.1, AST 75, high-sensitivity troponin greater than 24,000, HLD 32, WBC 17.4, INR 1.2.  Head CT positive for acute or subacute infarction in the left posterior cerebral artery territory, no intracranial hemorrhage.  Given new CVA with likely evolving ACS PCCM consulted for further management and admission  Pertinent  Medical History  HTN,l ung cancer receiving immunotherapy, gout, and GERD  Significant Hospital Events: Including procedures, antibiotic start and stop dates in addition to other pertinent events   5/11 presented with altered mental status code is to be activated, cath canceled with new acute/subacute stroke also identified 5/13 MRI suggesting stroke is acute. L P4 occlusion R P2 stenosis. ECHO   Interim History / Subjective:  NAEO   Respiratory status is at baseline per family -- incr RR and with appearance of incr WOB  Objective   Blood pressure 102/72, pulse (!) 106,  temperature 97.9 F (36.6 C), temperature source Oral, resp. rate (!) 36, height 5' 7.99" (1.727 m), weight 77 kg, SpO2 93 %.        Intake/Output Summary (Last 24 hours) at 04/21/2023 1013 Last data filed at 04/21/2023 0800 Gross per 24 hour  Intake 766.85 ml  Output 225 ml  Net 541.85 ml   Filed Weights   04/21/23 0500 04/21/23 0800  Weight: 77 kg 77 kg    Examination: General: Chronically ill older adult M NAD  HEENT: NCAT pink mm  Neuro: AAOx2 following commands, pleasantly confused  CV: tachycardic  PULM: CTAb  GI: soft ndnt  Extremities: no acute joint deformity. Trace edema  Skin: c/d/w   Resolved Hospital Problem list     Assessment & Plan:   Acute metabolic encephalopathy  P: -delirium precautions  -tx metabolic abnormalities as able   Acute L PCA territory infarct  L P4 occlusion  R P2 moderate stenosis  R frontal lobe R occipital lobe acute punctate ischemia  P -EEG - hep gtt x 48 hr low goal w/o bolus  Acute anteroseptal infarct P: -IV hep x 48 hrs, ASA, then add plavix if no bleeding  -no invasive eval -ECHO   AKI -normal renal US  P: -trend renal indices, UOP  -supportive care   Stg IIIb NSCLC  Possible Postobstructive PNA L pleural effusion  Acute hypoxic respiratory failure  -LUL mass -on imfinzi q4wk -- s/p 12 cycles.Final (13th) cycled dc due to immunotherapy mediated rash for which he has been on high dose steroid taper  P: - rocephin  x5d course  - PRN chest imaging -Wean O2 goal > 92 -IS, mobility, pulm hygiene   Elevated AST  P: -PRN LFT   GOC  -Wife had discussed code status with brother 5/12 and arrived at Full Code, buyt in our convo it was clear Code status was not well understood. We clarified what this means, what happens during a code etc. They feel strongly he would never want to be on MV. I have rec DNR code status. She will d/w pt brother (does not want to make solo decision) -family references he has been more  tired, withdrawn. Not participating with ADLs as much and more stationary. We talked broadly about the difference between aggressive medical tx vs palliative care and discussed code status. I have encouraged family to talk about QOL goals, what is important to pt etc, and to consider discussing palliative approach to his care especially in light of these new insults.   Best Practice (right click and "Reselect all SmartList Selections" daily)   Diet/type: NPO DVT prophylaxis: SCD GI prophylaxis: PPI Lines: N/A Foley:  N/A Code Status:  full code Last date of multidisciplinary goals of care discussion: 5/13 -- encouraged family to discuss code status and GOC  Dispo: possible transfer out of ICU later 5/13   Labs   CBC: Recent Labs  Lab 04/20/23 1522 04/21/23 0137  WBC 17.4* 14.5*  NEUTROABS 15.2*  --   HGB 11.3* 11.8*  HCT 37.2* 38.3*  MCV 86.9 84.7  PLT 252 279    Basic Metabolic Panel: Recent Labs  Lab 04/20/23 1522 04/21/23 0137  NA 136 136  K 4.6 4.1  CL 103 101  CO2 19* 21*  GLUCOSE 102* 77  BUN 39* 40*  CREATININE 3.06* 2.61*  CALCIUM 7.6* 7.9*  MG 1.9 2.1  PHOS  --  4.4   GFR: Estimated Creatinine Clearance: 24.8 mL/min (A) (by C-G formula based on SCr of 2.61 mg/dL (H)). Recent Labs  Lab 04/20/23 1522 04/21/23 0137  WBC 17.4* 14.5*    Liver Function Tests: Recent Labs  Lab 04/20/23 1522  AST 75*  ALT 43  ALKPHOS 76  BILITOT 1.2  PROT 5.8*  ALBUMIN 2.1*   No results for input(s): "LIPASE", "AMYLASE" in the last 168 hours. No results for input(s): "AMMONIA" in the last 168 hours.  ABG    Component Value Date/Time   TCO2 23 01/11/2022 0716     Coagulation Profile: Recent Labs  Lab 04/20/23 1522  INR 1.3*    Cardiac Enzymes: No results for input(s): "CKTOTAL", "CKMB", "CKMBINDEX", "TROPONINI" in the last 168 hours.  HbA1C: Hgb A1c MFr Bld  Date/Time Value Ref Range Status  04/20/2023 03:22 PM 5.2 4.8 - 5.6 % Final    Comment:     (NOTE) Pre diabetes:          5.7%-6.4%  Diabetes:              >6.4%  Glycemic control for   <7.0% adults with diabetes     CBG: Recent Labs  Lab 04/20/23 1638 04/20/23 2121  GLUCAP 93 88   CCT: n/a   Tessie Fass MSN, AGACNP-BC [facility] Amion for pager 04/21/2023, 10:13 AM

## 2023-04-21 NOTE — Progress Notes (Signed)
ANTICOAGULATION CONSULT NOTE   Pharmacy Consult for Heparin Indication: chest pain/ACS  Allergies  Allergen Reactions   Marinol [Dronabinol] Other (See Comments)    "Feels bloated. I don' t ever want to take it "    Patient Measurements: Height: 5' 7.99" (172.7 cm) Weight: 77 kg (169 lb 12.1 oz) IBW/kg (Calculated) : 68.38 Heparin Dosing Weight: 77 kg  Vital Signs: Temp: 97.9 F (36.6 C) (05/13 0822) Temp Source: Oral (05/13 0822) BP: 117/78 (05/13 1600) Pulse Rate: 124 (05/13 1600)  Labs: Recent Labs    04/20/23 1522 04/21/23 0137 04/21/23 1557  HGB 11.3* 11.8*  --   HCT 37.2* 38.3*  --   PLT 252 279  --   APTT 31  --   --   LABPROT 16.5*  --   --   INR 1.3*  --   --   HEPARINUNFRC  --   --  <0.10*  CREATININE 3.06* 2.61*  --   TROPONINIHS >24,000*  --   --      Estimated Creatinine Clearance: 24.8 mL/min (A) (by C-G formula based on SCr of 2.61 mg/dL (H)).   Medical History: Past Medical History:  Diagnosis Date   GERD (gastroesophageal reflux disease)    Gout    History of radiation therapy    Left lung- (01/28/22-03/08/22)- Dr. Antony Blackbird   HTN (hypertension)     Assessment: Peter Diaz is a 72 yo M PMH significant for NSCLC presenting with AMS. On presentation, EKG showing STEMI, MRI showing acute PCA infarct. Cath deferred in the setting of active stroke. Pharmacy has been consulted for heparin x48h for medical management of ACS.  -heparin level undetectable on 800 units/hr  Goal of Therapy:  Heparin level 0.3-0.5 Monitor platelets by anticoagulation protocol: Yes   Plan:  No heparin bolus Increase heparin to 1000 units/hr Monitor daily heparin level, CBC  Harland German, PharmD Clinical Pharmacist **Pharmacist phone directory can now be found on [email or web address] ([email or web address]).  Listed under [facility].

## 2023-04-21 NOTE — Progress Notes (Signed)
Pharmacy Antibiotic Note  Peter Diaz is a 72 y.o. male admitted on 04/20/2023 with pneumonia.  PMH significant for NSCLC. On admission WBC elevated to 17.5, RR 24. Pharmacy has been consulted for empiric ceftriaxone dosing for suspected PNA.  Plan: Start ceftriaxone 2g Q24h x5d course Monitor clinical progression, no renal adjustment necessary.  Pharmacy will sign off, please re-consult for additional assistance  Height: 5' 7.99" (172.7 cm) Weight: 77 kg (169 lb 12.1 oz) IBW/kg (Calculated) : 68.38  Temp (24hrs), Avg:97.7 F (36.5 C), Min:97.6 F (36.4 C), Max:97.9 F (36.6 C)  Recent Labs  Lab 04/20/23 1522 04/21/23 0137  WBC 17.4* 14.5*  CREATININE 3.06* 2.61*    Estimated Creatinine Clearance: 24.8 mL/min (A) (by C-G formula based on SCr of 2.61 mg/dL (H)).    Allergies  Allergen Reactions   Marinol [Dronabinol] Other (See Comments)    "Feels bloated. I don' t ever want to take it "    Antimicrobials this admission: Ceftriaxone 5/13 >> (5/18)  Dose adjustments this admission: N/a  Microbiology results: 5/12 BCx: ngtd 5/12 MRSA PCR: negative  Thank you for allowing pharmacy to be a part of this patient's care.  Rennis Petty, PharmD PGY1 Pharmacy Resident 04/21/2023 10:28 AM

## 2023-04-21 NOTE — Progress Notes (Signed)
Inpatient Rehab Admissions Coordinator:  ? ?Per therapy recommendations,  patient was screened for CIR candidacy by Javonta Gronau, MS, CCC-SLP. At this time, Pt. Appears to be a a potential candidate for CIR. I will place   order for rehab consult per protocol for full assessment. Please contact me any with questions. ? ?Rasheka Denard, MS, CCC-SLP ?Rehab Admissions Coordinator  ?[phone] (celll) ?[phone] (office) ? ?

## 2023-04-21 NOTE — Progress Notes (Addendum)
eLink Physician-Brief Progress Note Patient Name: Peter Diaz DOB: 17-Jul-1951 MRN: [id number]   Date of Service  04/21/2023  HPI/Events of Note  72 year old male that presented to the hospital with acute encephalopathy found to have an acute CVA with concurrent acute ACS and a history of non-small cell lung carcinoma.  Requesting for something to help him sleep tonight  eICU Interventions  As needed trazodone   2230 -  Bottom is excuriated, unable to pouch, fecal incontinence. Add flexiseal  Intervention Category Minor Interventions: Routine modifications to care plan (e.g. PRN medications for pain, fever)  Peter Diaz 04/21/2023, 7:59 PM

## 2023-04-21 NOTE — Evaluation (Signed)
Speech Language Pathology Evaluation Patient Details Name: Peter Diaz MRN: [id number] DOB: [birth year] Today's Date: 04/21/2023 Time: 8295-6213 SLP Time Calculation (min) (ACUTE ONLY): 28 min  Problem List:  Patient Active Problem List   Diagnosis Date Noted   Cerebrovascular accident (CVA) due to occlusion of left posterior cerebral artery (HCC) 04/21/2023   Acute respiratory failure with hypoxia (HCC) 04/21/2023   Encephalopathy acute 04/21/2023   STEMI (ST elevation myocardial infarction) (HCC) 04/20/2023   Rash 03/19/2023   Hypokalemia 11/27/2022   Encounter for antineoplastic immunotherapy 04/08/2022   Squamous cell carcinoma of bronchus in left lower lobe (HCC) 01/17/2022   Encounter for antineoplastic chemotherapy 01/17/2022   Lung mass 12/31/2021   Cavitating mass of lung 12/31/2021   Past Medical History:  Past Medical History:  Diagnosis Date   GERD (gastroesophageal reflux disease)    Gout    History of radiation therapy    Left lung- (01/28/22-03/08/22)- Dr. Antony Blackbird   HTN (hypertension)    Past Surgical History:  Past Surgical History:  Procedure Laterality Date   BIOPSY  01/11/2022   Procedure: BIOPSY;  Surgeon: Josephine Igo, DO;  Location: [facility];  Service: Pulmonary;;   BRAIN SURGERY     BRONCHIAL BRUSHINGS  01/11/2022   Procedure: BRONCHIAL BRUSHINGS;  Surgeon: Josephine Igo, DO;  Location: [facility];  Service: Pulmonary;;   BRONCHIAL NEEDLE ASPIRATION BIOPSY  01/11/2022   Procedure: BRONCHIAL NEEDLE ASPIRATION BIOPSIES;  Surgeon: Josephine Igo, DO;  Location: [facility];  Service: Pulmonary;;   VIDEO BRONCHOSCOPY WITH ENDOBRONCHIAL ULTRASOUND Left 01/11/2022   Procedure: VIDEO BRONCHOSCOPY WITH ENDOBRONCHIAL ULTRASOUND;  Surgeon: Josephine Igo, DO;  Location: [facility];  Service: Pulmonary;  Laterality: Left;   HPI:  The pt is a 72 yo male presenting 5/12 with AMS, weakness, and recent falls. Work up showed STEMI and acute  infarct of left PCA territory involving the left occipital and medial temporal lobes. PMH includes: GERD, gout, HTN, and lung cancer on immunotherapy. ST consulted for speech/language evaluation d/t confusion/altered mentation.  Assessment / Plan / Recommendation Clinical Impression  Pt seen for speech/language assessment via observation/portions of [employer] Mental Status Examination (SLUMS) performed but unable to obtain a total score with deficits in areas of attention, memory recall, orientation and problem solving noted with various simple tasks such as digit reversal, auditory comprehension questions from simple paragraph and simple calculations.  Pt oriented to self/place, but not time/situation.  Speech intelligible within simple conversation, but wife stated his speech "sounds different."  L facial sensory impairment noted by pt, but appeared [facility] during clinical swallowing evaluation.  Family denotes symptoms are "all new" and pt was oriented and able to function independently PLOF.  Recommend ST f/u for cog/linguistic changes during acute stay.  Thank you for this consult.    SLP Assessment  SLP Recommendation/Assessment: Patient needs continued Speech Language Pathology Services SLP Visit Diagnosis: Cognitive communication deficit (R41.841);Attention and concentration deficit Attention and concentration deficit following: Cerebral infarction    Recommendations for follow up therapy are one component of a multi-disciplinary discharge planning process, led by the attending physician.  Recommendations may be updated based on patient status, additional functional criteria and insurance authorization.    Follow Up Recommendations  Acute inpatient rehab (3hours/day)    Assistance Recommended at Discharge  Frequent or constant Supervision/Assistance  Functional Status Assessment Patient has had a recent decline in their functional status and demonstrates the ability to make  significant improvements in function in  a reasonable and predictable amount of time.  Frequency and Duration min 2x/week  1 week      SLP Evaluation Cognition  Overall Cognitive Status: Impaired/Different from baseline Arousal/Alertness: Awake/alert Orientation Level: Oriented to person;Oriented to place;Disoriented to situation;Disoriented to time Month: September Day of Week: Incorrect Attention: Sustained Sustained Attention: Impaired Sustained Attention Impairment: Verbal basic;Functional basic Memory: Impaired Memory Impairment: Retrieval deficit;Decreased recall of new information;Decreased short term memory Decreased Short Term Memory: Verbal basic;Functional basic Awareness: Impaired Awareness Impairment: Intellectual impairment Problem Solving: Impaired Problem Solving Impairment: Verbal basic;Functional basic Behaviors: Impulsive Safety/Judgment: Impaired Comments: Pt stated he could get out of the bed to use the bathroom and didn't need help       Comprehension  Auditory Comprehension Overall Auditory Comprehension: Impaired Yes/No Questions: Within Functional Limits Commands: Impaired Multistep Basic Commands: 25-49% accurate Conversation: Simple Interfering Components: Attention EffectiveTechniques: Extra processing time;Repetition Visual Recognition/Discrimination Discrimination: Not tested Reading Comprehension Reading Status: Not tested    Expression Expression Primary Mode of Expression: Verbal Verbal Expression Overall Verbal Expression: Appears within functional limits for tasks assessed Level of Generative/Spontaneous Verbalization: Conversation Repetition: No impairment Naming: Not tested Pragmatics: Unable to assess Interfering Components: Attention Non-Verbal Means of Communication: Not applicable Written Expression Dominant Hand: Right Written Expression: Not tested   Oral / Motor  Oral Motor/Sensory Function Overall Oral Motor/Sensory  Function: Mild impairment Facial Symmetry: Within Functional Limits Facial Strength: Within Functional Limits Facial Sensation: Reduced left Lingual ROM: Within Functional Limits Lingual Symmetry: Within Functional Limits Lingual Strength: Within Functional Limits Motor Speech Overall Motor Speech: Appears within functional limits for tasks assessed Respiration: Within functional limits Phonation: Low vocal intensity Resonance: Within functional limits Articulation: Within functional limitis Intelligibility: Intelligible Motor Planning: Witnin functional limits Motor Speech Errors: Not applicable            Pat Sunny Gains,M.S., CCC-SLP 04/21/2023, 2:26 PM

## 2023-04-21 NOTE — Progress Notes (Signed)
Echocardiogram 2D Echocardiogram has been performed.  Peter Diaz 04/21/2023, 10:59 AM

## 2023-04-21 NOTE — Progress Notes (Signed)
Lower extremity venous bilateral study completed.   Please see CV Proc for preliminary results.   Janda Cargo, RDMS, RVT  

## 2023-04-21 NOTE — Evaluation (Signed)
Occupational Therapy Evaluation Patient Details Name: Peter Diaz MRN: [id number] DOB: Apr 07, 1951 Today's Date: 04/21/2023   History of Present Illness The pt is a 72 yo male presenting 5/12 with AMS, weakness, and recent falls. Work up showed STEMI and acute infarct of left PCA territory involving the left occipital and medial temporal lobes. PMH includes: GERD, gout, HTN, and lung cancer on immunotherapy.   Clinical Impression   Pt reports independence at baseline with ADLs and functional mobility, per family has had falls in the past. Pt needing set up - mod A for ADLs, mod A for bed mobility,and min A for step pivot transfer with RW. Pt with HR up to 140 and RR 25-40 during session. Pt with impaired vision, only able to read L side of sentence, and unable to follow commands correctly for full visual assessment, will further assess. Pt presenting with impairments listed below. Patient will benefit from intensive inpatient follow up therapy, >3 hours/day to maximize safety and independence with ADLs and functional mobility.       Recommendations for follow up therapy are one component of a multi-disciplinary discharge planning process, led by the attending physician.  Recommendations may be updated based on patient status, additional functional criteria and insurance authorization.   Assistance Recommended at Discharge Frequent or constant Supervision/Assistance  Patient can return home with the following A lot of help with walking and/or transfers;A lot of help with bathing/dressing/bathroom;Assistance with cooking/housework;Direct supervision/assist for financial management;Direct supervision/assist for medications management;Assist for transportation;Help with stairs or ramp for entrance    Functional Status Assessment  Patient has had a recent decline in their functional status and demonstrates the ability to make significant improvements in function in a reasonable and predictable  amount of time.  Equipment Recommendations  Other (comment) (defer)    Recommendations for Other Services PT consult;Rehab consult     Precautions / Restrictions Precautions Precautions: Fall Precaution Comments: watch vitals Restrictions Weight Bearing Restrictions: No      Mobility Bed Mobility Overal bed mobility: Needs Assistance Bed Mobility: Supine to Sit     Supine to sit: Mod assist          Transfers Overall transfer level: Needs assistance Equipment used: Rolling walker (2 wheels) Transfers: Sit to/from Stand, Bed to chair/wheelchair/BSC Sit to Stand: Min assist     Step pivot transfers: Min assist            Balance Overall balance assessment: Needs assistance Sitting-balance support: No upper extremity supported, Feet supported Sitting balance-Leahy Scale: Good     Standing balance support: Bilateral upper extremity supported, During functional activity Standing balance-Leahy Scale: Poor                             ADL either performed or assessed with clinical judgement   ADL Overall ADL's : Needs assistance/impaired Eating/Feeding: Set up;Sitting   Grooming: Set up;Sitting   Upper Body Bathing: Moderate assistance;Sitting   Lower Body Bathing: Moderate assistance;Sitting/lateral leans   Upper Body Dressing : Moderate assistance;Sitting   Lower Body Dressing: Moderate assistance;Sitting/lateral leans   Toilet Transfer: Minimal assistance;Stand-pivot;Rolling walker (2 wheels);BSC/3in1   Toileting- Clothing Manipulation and Hygiene: Moderate assistance       Functional mobility during ADLs: Minimal assistance;Rolling walker (2 wheels)       Vision Baseline Vision/History: 1 Wears glasses Ability to See in Adequate Light: 1 Impaired Vision Assessment?: Yes Eye Alignment: Within Functional Limits Ocular Range of Motion:  Impaired-to be further tested in functional context Tracking/Visual Pursuits: Decreased  smoothness of vertical tracking;Decreased smoothness of horizontal tracking;Unable to hold eye position out of midline;Impaired - to be further tested in functional context Additional Comments: difficutly following commands for visual assesment, does not track to R/L without continued cues, incorrectly identifies numbers held up in different quadrants of visual field, cannot read R side of sign on bathroom door     Perception Perception Perception Tested?: No   Praxis Praxis Praxis tested?: Not tested    Pertinent Vitals/Pain Pain Assessment Pain Assessment: No/denies pain     Hand Dominance Right   Extremity/Trunk Assessment Upper Extremity Assessment Upper Extremity Assessment: Generalized weakness   Lower Extremity Assessment Lower Extremity Assessment: Defer to PT evaluation   Cervical / Trunk Assessment Cervical / Trunk Assessment: Normal   Communication Communication Communication: No difficulties   Cognition Arousal/Alertness: Awake/alert Behavior During Therapy: WFL for tasks assessed/performed Overall Cognitive Status: Impaired/Different from baseline Area of Impairment: Orientation, Attention, Following commands, Safety/judgement, Problem solving, Awareness                 Orientation Level: Situation Current Attention Level: Focused   Following Commands: Follows one step commands inconsistently Safety/Judgement: Decreased awareness of deficits, Decreased awareness of safety Awareness: Intellectual Problem Solving: Slow processing, Decreased initiation, Requires verbal cues General Comments: pt with difficulty following instruction for basic tasks,     General Comments  elevated HR (120's) and RR (24-40) during session    Exercises     Shoulder Instructions      Home Living Family/patient expects to be discharged to:: Private residence Living Arrangements: Spouse/significant other Available Help at Discharge: Family;Available  PRN/intermittently Type of Home: House Home Access: Stairs to enter [employer] of Steps: 3   Home Layout: One level     Bathroom Shower/Tub: Producer, television/film/video: Standard     Home Equipment: Agricultural consultant (2 wheels);Cane - single point;Shower seat          Prior Functioning/Environment Prior Level of Function : Independent/Modified Independent;Driving             Mobility Comments: no AD use ADLs Comments: ind with ADLs        OT Problem List: Decreased strength;Decreased range of motion;Decreased activity tolerance;Impaired balance (sitting and/or standing);Decreased coordination;Impaired vision/perception;Decreased safety awareness;Decreased cognition;Cardiopulmonary status limiting activity      OT Treatment/Interventions: Self-care/ADL training;Therapeutic exercise;Energy conservation;DME and/or AE instruction;Therapeutic activities;Patient/family education;Balance training;Cognitive remediation/compensation;Visual/perceptual remediation/compensation;Neuromuscular education    OT Goals(Current goals can be found in the care plan section) Acute Rehab OT Goals Patient Stated Goal: none stated OT Goal Formulation: With patient Time For Goal Achievement: 05/05/23 Potential to Achieve Goals: Good ADL Goals Pt Will Perform Grooming: with supervision;standing Pt Will Perform Upper Body Dressing: with supervision;sitting Pt Will Perform Lower Body Dressing: with supervision;sit to/from stand Pt Will Transfer to Toilet: with supervision;ambulating;regular height toilet Pt Will Perform Tub/Shower Transfer: Tub transfer;Shower transfer;with supervision;ambulating  OT Frequency: Min 1X/week    Co-evaluation PT/OT/SLP Co-Evaluation/Treatment: Yes Reason for Co-Treatment: Complexity of the patient's impairments (multi-system involvement);For patient/therapist safety;To address functional/ADL transfers PT goals addressed during session:  Mobility/safety with mobility;Balance;Strengthening/ROM;Proper use of DME OT goals addressed during session: ADL's and self-care;Strengthening/ROM      AM-PAC OT "6 Clicks" Daily Activity     Outcome Measure Help from another person eating meals?: A Little Help from another person taking care of personal grooming?: A Little Help from another person toileting, which includes using toliet, bedpan,  or urinal?: A Lot Help from another person bathing (including washing, rinsing, drying)?: A Lot Help from another person to put on and taking off regular upper body clothing?: A Lot Help from another person to put on and taking off regular lower body clothing?: A Lot 6 Click Score: 14   End of Session Equipment Utilized During Treatment: Gait belt;Rolling walker (2 wheels);Oxygen (2L) Nurse Communication: Mobility status  Activity Tolerance: Patient tolerated treatment well Patient left: with call bell/phone within reach;in chair;with chair alarm set  OT Visit Diagnosis: Unsteadiness on feet (R26.81);Other abnormalities of gait and mobility (R26.89);Muscle weakness (generalized) (M62.81);History of falling (Z91.81);Other symptoms and signs involving cognitive function                Time: 1610-9604 OT Time Calculation (min): 27 min Charges:  OT General Charges $OT Visit: 1 Visit OT Evaluation $OT Eval Moderate Complexity: 1 Mod  Xaria Judon K, OTD, OTR/L SecureChat Preferred Acute Rehab [phone]   Carver Fila Koonce 04/21/2023, 12:31 PM

## 2023-04-21 NOTE — Progress Notes (Signed)
Rounding Note    Patient Name: Peter Diaz Date of Encounter: 04/21/2023  [facility] Cardiologist: None new  Subjective   Patient denies any complaints. Knows his name and that he is in hospital because he is "sick". Denies any chest pain or SOB  Inpatient Medications    Scheduled Meds:   stroke: early stages of recovery book   Does not apply Once   Chlorhexidine Gluconate Cloth  6 each Topical Daily   Continuous Infusions:  sodium chloride     lactated ringers 100 mL/hr at 04/21/23 0400   PRN Meds: acetaminophen **OR** acetaminophen (TYLENOL) oral liquid 160 mg/5 mL **OR** acetaminophen, docusate sodium, mouth rinse, polyethylene glycol   Vital Signs    Vitals:   04/21/23 0400 04/21/23 0415 04/21/23 0500 04/21/23 0800  BP: 102/66     Pulse: (!) 111     Resp: (!) 30     Temp:  97.8 F (36.6 C)    TempSrc:  Oral    SpO2: 96%     Weight:   77 kg 77 kg  Height:    5' 7.99" (1.727 m)    Intake/Output Summary (Last 24 hours) at 04/21/2023 0807 Last data filed at 04/21/2023 0400 Gross per 24 hour  Intake 372.78 ml  Output 225 ml  Net 147.78 ml      04/21/2023    8:00 AM 04/21/2023    5:00 AM 03/19/2023   11:35 AM  Last 3 Weights  Weight (lbs) 169 lb 12.1 oz 169 lb 12.1 oz 166 lb 6.4 oz  Weight (kg) 77 kg 77 kg 75.479 kg      Telemetry    NSR, sinus tachy - Personally Reviewed  ECG    NSR with acute anteroseptal infarct.  - Personally Reviewed  Physical Exam   GEN: No acute distress.   Neck: No JVD Cardiac: RRR, no murmurs, rubs, or gallops.  Respiratory: Clear to auscultation bilaterally. GI: Soft, nontender, non-distended  MS: No edema; No deformity. Neuro:  oriented to self and place.  Psych: Normal affect   Labs    High Sensitivity Troponin:   Recent Labs  Lab 04/20/23 1522  TROPONINIHS >24,000*     Chemistry Recent Labs  Lab 04/20/23 1522 04/21/23 0137  NA 136 136  K 4.6 4.1  CL 103 101  CO2 19* 21*  GLUCOSE 102*  77  BUN 39* 40*  CREATININE 3.06* 2.61*  CALCIUM 7.6* 7.9*  MG 1.9 2.1  PROT 5.8*  --   ALBUMIN 2.1*  --   AST 75*  --   ALT 43  --   ALKPHOS 76  --   BILITOT 1.2  --   GFRNONAA 21* 25*  ANIONGAP 14 14    Lipids  Recent Labs  Lab 04/20/23 1522  CHOL 100  TRIG 69  HDL 32*  LDLCALC 54  CHOLHDL 3.1    Hematology Recent Labs  Lab 04/20/23 1522 04/21/23 0137  WBC 17.4* 14.5*  RBC 4.28 4.52  HGB 11.3* 11.8*  HCT 37.2* 38.3*  MCV 86.9 84.7  MCH 26.4 26.1  MCHC 30.4 30.8  RDW 15.5 15.6*  PLT 252 279   Thyroid No results for input(s): "TSH", "FREET4" in the last 168 hours.  BNPNo results for input(s): "BNP", "PROBNP" in the last 168 hours.  DDimer No results for input(s): "DDIMER" in the last 168 hours.   Radiology    MR BRAIN W WO CONTRAST  Result Date: 04/21/2023 CLINICAL DATA:  Acute neurologic deficit EXAM: MRI HEAD WITHOUT AND WITH CONTRAST MRA HEAD WITHOUT CONTRAST MRA NECK WITHOUT CONTRAST TECHNIQUE: Multiplanar, multiecho pulse sequences of the brain and surrounding structures were obtained without and with intravenous contrast. Angiographic images of the Circle of Willis were obtained using MRA technique without intravenous contrast. Angiographic images of the neck were obtained using MRA technique without intravenous contrast. Carotid stenosis measurements (when applicable) are obtained utilizing NASCET criteria, using the distal internal carotid diameter as the denominator. CONTRAST:  7.11mL GADAVIST GADOBUTROL 1 MMOL/ML IV SOLN COMPARISON:  None Available. FINDINGS: MRI HEAD FINDINGS Brain: Acute infarct of the left PCA territory involving the left occipital and medial temporal lobes. Multiple punctate foci of acute ischemia within the right frontal lobe and right occipital lobe. Old left frontal and right parietal infarcts. There is multifocal hyperintense T2-weighted signal within the white matter. Generalized volume loss. The midline structures are normal. There is  no abnormal contrast enhancement. Vascular: Major flow voids are preserved. Skull and upper cervical spine: Normal calvarium and skull base. Visualized upper cervical spine and soft tissues are normal. Sinuses/Orbits:Opacified sphenoid sinuses.  Normal orbits. MRA HEAD FINDINGS POSTERIOR CIRCULATION: --Vertebral arteries: Normal --Inferior cerebellar arteries: Normal. --Basilar artery: Normal. --Superior cerebellar arteries: Normal. --Posterior cerebral arteries: Moderate stenosis of the distal P2 segment of the right PCA. Occlusion of the left PCA P4 segment. ANTERIOR CIRCULATION: --Intracranial internal carotid arteries: Normal. --Anterior cerebral arteries (ACA): Normal. --Middle cerebral arteries (MCA): Normal. MRA NECK FINDINGS Motion degraded time-of-flight imaging of the carotid and vertebral systems. Postcontrast imaging could not be obtained due to software failure. The visible portions of the carotid and vertebral arteries are normal. The proximal vessels are not visualized. IMPRESSION: 1. Acute infarct of the left PCA territory involving the left occipital and medial temporal lobes. No hemorrhage or mass effect. 2. Multiple punctate foci of acute ischemia within the right frontal lobe and right occipital lobe. 3. Occlusion of the left PCA P4 segment. Moderate stenosis of the distal P2 segment of the right PCA. 4. No visible occlusion in the neck. Electronically Signed   By: Deatra Robinson M.D.   On: 04/21/2023 01:33   MR ANGIO HEAD WO CONTRAST  Result Date: 04/21/2023 CLINICAL DATA:  Acute neurologic deficit EXAM: MRI HEAD WITHOUT AND WITH CONTRAST MRA HEAD WITHOUT CONTRAST MRA NECK WITHOUT CONTRAST TECHNIQUE: Multiplanar, multiecho pulse sequences of the brain and surrounding structures were obtained without and with intravenous contrast. Angiographic images of the Circle of Willis were obtained using MRA technique without intravenous contrast. Angiographic images of the neck were obtained using MRA  technique without intravenous contrast. Carotid stenosis measurements (when applicable) are obtained utilizing NASCET criteria, using the distal internal carotid diameter as the denominator. CONTRAST:  7.56mL GADAVIST GADOBUTROL 1 MMOL/ML IV SOLN COMPARISON:  None Available. FINDINGS: MRI HEAD FINDINGS Brain: Acute infarct of the left PCA territory involving the left occipital and medial temporal lobes. Multiple punctate foci of acute ischemia within the right frontal lobe and right occipital lobe. Old left frontal and right parietal infarcts. There is multifocal hyperintense T2-weighted signal within the white matter. Generalized volume loss. The midline structures are normal. There is no abnormal contrast enhancement. Vascular: Major flow voids are preserved. Skull and upper cervical spine: Normal calvarium and skull base. Visualized upper cervical spine and soft tissues are normal. Sinuses/Orbits:Opacified sphenoid sinuses.  Normal orbits. MRA HEAD FINDINGS POSTERIOR CIRCULATION: --Vertebral arteries: Normal --Inferior cerebellar arteries: Normal. --Basilar artery: Normal. --Superior cerebellar arteries: Normal. --Posterior cerebral arteries: Moderate  stenosis of the distal P2 segment of the right PCA. Occlusion of the left PCA P4 segment. ANTERIOR CIRCULATION: --Intracranial internal carotid arteries: Normal. --Anterior cerebral arteries (ACA): Normal. --Middle cerebral arteries (MCA): Normal. MRA NECK FINDINGS Motion degraded time-of-flight imaging of the carotid and vertebral systems. Postcontrast imaging could not be obtained due to software failure. The visible portions of the carotid and vertebral arteries are normal. The proximal vessels are not visualized. IMPRESSION: 1. Acute infarct of the left PCA territory involving the left occipital and medial temporal lobes. No hemorrhage or mass effect. 2. Multiple punctate foci of acute ischemia within the right frontal lobe and right occipital lobe. 3. Occlusion  of the left PCA P4 segment. Moderate stenosis of the distal P2 segment of the right PCA. 4. No visible occlusion in the neck. Electronically Signed   By: Deatra Robinson M.D.   On: 04/21/2023 01:33   MR ANGIO NECK W WO CONTRAST  Result Date: 04/21/2023 CLINICAL DATA:  Acute neurologic deficit EXAM: MRI HEAD WITHOUT AND WITH CONTRAST MRA HEAD WITHOUT CONTRAST MRA NECK WITHOUT CONTRAST TECHNIQUE: Multiplanar, multiecho pulse sequences of the brain and surrounding structures were obtained without and with intravenous contrast. Angiographic images of the Circle of Willis were obtained using MRA technique without intravenous contrast. Angiographic images of the neck were obtained using MRA technique without intravenous contrast. Carotid stenosis measurements (when applicable) are obtained utilizing NASCET criteria, using the distal internal carotid diameter as the denominator. CONTRAST:  7.75mL GADAVIST GADOBUTROL 1 MMOL/ML IV SOLN COMPARISON:  None Available. FINDINGS: MRI HEAD FINDINGS Brain: Acute infarct of the left PCA territory involving the left occipital and medial temporal lobes. Multiple punctate foci of acute ischemia within the right frontal lobe and right occipital lobe. Old left frontal and right parietal infarcts. There is multifocal hyperintense T2-weighted signal within the white matter. Generalized volume loss. The midline structures are normal. There is no abnormal contrast enhancement. Vascular: Major flow voids are preserved. Skull and upper cervical spine: Normal calvarium and skull base. Visualized upper cervical spine and soft tissues are normal. Sinuses/Orbits:Opacified sphenoid sinuses.  Normal orbits. MRA HEAD FINDINGS POSTERIOR CIRCULATION: --Vertebral arteries: Normal --Inferior cerebellar arteries: Normal. --Basilar artery: Normal. --Superior cerebellar arteries: Normal. --Posterior cerebral arteries: Moderate stenosis of the distal P2 segment of the right PCA. Occlusion of the left PCA P4  segment. ANTERIOR CIRCULATION: --Intracranial internal carotid arteries: Normal. --Anterior cerebral arteries (ACA): Normal. --Middle cerebral arteries (MCA): Normal. MRA NECK FINDINGS Motion degraded time-of-flight imaging of the carotid and vertebral systems. Postcontrast imaging could not be obtained due to software failure. The visible portions of the carotid and vertebral arteries are normal. The proximal vessels are not visualized. IMPRESSION: 1. Acute infarct of the left PCA territory involving the left occipital and medial temporal lobes. No hemorrhage or mass effect. 2. Multiple punctate foci of acute ischemia within the right frontal lobe and right occipital lobe. 3. Occlusion of the left PCA P4 segment. Moderate stenosis of the distal P2 segment of the right PCA. 4. No visible occlusion in the neck. Electronically Signed   By: Deatra Robinson M.D.   On: 04/21/2023 01:33   US RENAL  Result Date: 04/20/2023 CLINICAL DATA:  Acute kidney injury. EXAM: RENAL / URINARY TRACT ULTRASOUND COMPLETE COMPARISON:  PET-CT 01/08/2022 FINDINGS: Right Kidney: Renal measurements: 10.6 x 4.7 x 4.1 cm = volume: 106 mL. Echogenicity within normal limits. No mass or hydronephrosis visualized. Left Kidney: Renal measurements: 9.4 x 4.7 x 5.0 = volume: 116 mL. Echogenicity  within normal limits. No mass or hydronephrosis visualized. Bladder: Urinary bladder was empty and not well visualized. Other: None. IMPRESSION: Normal sonographic evaluation of the kidneys. Urinary bladder was empty and not well visualized. Electronically Signed   By: Sherron Ales M.D.   On: 04/20/2023 19:27   DG Chest Port 1 View  Result Date: 04/20/2023 CLINICAL DATA:  Altered mental status EXAM: PORTABLE CHEST 1 VIEW COMPARISON:  Chest x-ray January 18 2022. Chest CT November 25, 2022 and Apr 18, 2023 FINDINGS: A left pleural effusion is identified. Opacity is identified in the lingula/lower lobe better appreciated on the Apr 18, 2023 CT and  probably similar in the interval. This finding is increased compared to the comparison chest x-ray. The right lung remains clear. The cardiomediastinal silhouette is stable. No pneumothorax. IMPRESSION: 1. Opacity in the lingula/lower lobe has increased compared to the prior chest x-ray. This finding is better appreciated on the Apr 18, 2023 CT and probably similar in the interval. 2. A left pleural effusion is identified. Electronically Signed   By: Gerome Sam III M.D.   On: 04/20/2023 17:49   CT Head Wo Contrast  Result Date: 04/20/2023 CLINICAL DATA:  Mental status change. EXAM: CT HEAD WITHOUT CONTRAST TECHNIQUE: Contiguous axial images were obtained from the base of the skull through the vertex without intravenous contrast. RADIATION DOSE REDUCTION: This exam was performed according to the departmental dose-optimization program which includes automated exposure control, adjustment of the mA and/or kV according to patient size and/or use of iterative reconstruction technique. COMPARISON:  Brain MRI 01/22/2022 FINDINGS: Brain: Within the medial LEFT occipital lobe there is a geographic region of low cortical density along the posterior falx suggesting a LEFT posterior cerebral arterial infarction. Infarcted region measures 5.7 by 2.5 by 2.5 cm (volume = 19 cm^3) Regional gliosis and encephalomalacia in the LEFT frontal lobe is unchanged. No intracranial hemorrhage.  No midline shift or mass effect. Vascular: No hyperdense vessel or unexpected calcification. Skull: Normal. Negative for fracture or focal lesion. Sinuses/Orbits: Extensive thickening of the maxillary sinus walls. There is evidence of prior ethmoid surgery. Mucosal thickening and fluid levels within the paranasal sinuses. Mucosal thickening extends in the frontal sinus Other: None IMPRESSION: 1. Acute or subacute infarction in the LEFT posterior cerebral artery territory (medial LEFT occipital lobe). 2. No intracranial hemorrhage. 3. Chronic  LEFT frontal lobe infarction. 4. Acute on chronic sinusitis. Electronically Signed   By: Genevive Bi M.D.   On: 04/20/2023 16:00    Cardiac Studies   Echo pending  Patient Profile     72 y.o. male with T4N2M0 nonsmall cell lung CA. Presents with acute posterior stroke and ASMI  Assessment & Plan    Acute anteroseptal infarct. Onset unclear as patient never had chest pain. Troponin >24K. Ecg with ST elevation in anteroseptal leads with Q waves 1, Avl, V1-4. Suspect infarct completed. Medical therapy limited by low BP. Will proceed with IV heparin for 48 hours and ASA. Would then add plavix if no bleeding complications. Would not pursue invasive cardiac evaluation given poor prognosis from standpoint of lung CA and stroke. Family still wants him to be full code for now. Will check Echo today. Acute posterior stroke. No hemorrhage. Per Neuro Non small cell lung CA. S/p chemoradiation. Had CT chest on 5/10 - not read yet.  History of HTN. Now with low BP. Antihypertensives on hold.  Acute kidney injury. Renal US normal. No obstruction. Did have contrast CT on 5/10. Renal function a  little better today. Supportive care.      For questions or updates, please contact [facility] Please consult [email or web address] for contact info under        Signed, Brighton Delio [place], MD  04/21/2023, 8:07 AM

## 2023-04-21 NOTE — Evaluation (Signed)
Physical Therapy Evaluation Patient Details Name: Peter Diaz MRN: [id number] DOB: 06/03/1951 Today's Date: 04/21/2023  History of Present Illness  The pt is a 72 yo male presenting 5/12 with AMS, weakness, and recent falls. Work up showed STEMI and acute infarct of left PCA territory involving the left occipital and medial temporal lobes. PMH includes: GERD, gout, HTN, and lung cancer on immunotherapy.   Clinical Impression  Pt in bed upon arrival of PT, agreeable to evaluation at this time. Prior to admission the pt was independent without use of DME in the home, described sedentary lifestyle but was independent with mobility and ADLs. The pt now presents with limitations in functional mobility, power, endurance, and activity tolerance due to above dx, and will continue to benefit from skilled PT to address these deficits. The pt demos good strength to isolated testing, but poor functional endurance for mobility. He also required BUE support for safety and minA for balance with stepping to recliner. The pt is eager to progress back towards independence with transfers and mobility, recommend intensive therapies prior to return home with family support.    HR: 128bpm RR: 25-40 SpO2: 92-98% on 2L   Recommendations for follow up therapy are one component of a multi-disciplinary discharge planning process, led by the attending physician.  Recommendations may be updated based on patient status, additional functional criteria and insurance authorization.  Follow Up Recommendations       Assistance Recommended at Discharge Frequent or constant Supervision/Assistance  Patient can return home with the following  A lot of help with walking and/or transfers;A little help with bathing/dressing/bathroom;Assistance with cooking/housework;Direct supervision/assist for medications management;Direct supervision/assist for financial management;Assist for transportation;Help with stairs or ramp for  entrance    Equipment Recommendations Other (comment) (defer to post acute)  Recommendations for Other Services  Rehab consult    Functional Status Assessment Patient has had a recent decline in their functional status and demonstrates the ability to make significant improvements in function in a reasonable and predictable amount of time.     Precautions / Restrictions Precautions Precautions: Fall Precaution Comments: watch vitals, on 2L O2 this session with elevated RR Restrictions Weight Bearing Restrictions: No      Mobility  Bed Mobility Overal bed mobility: Needs Assistance Bed Mobility: Supine to Sit     Supine to sit: Mod assist     General bed mobility comments: minA to LE to reach EOB, modA to elevate trunk, pt pulling up on therapist. SOB after exertion with HR 120s    Transfers Overall transfer level: Needs assistance Equipment used: Rolling walker (2 wheels) Transfers: Sit to/from Stand, Bed to chair/wheelchair/BSC Sit to Stand: Min assist   Step pivot transfers: Min assist       General transfer comment: minA to rise and steady, pt managing small lateral steps to recliner with minA to steady. HR to 128bpm and RR to 40    Ambulation/Gait               General Gait Details: limited to small lateral steps to recliner due to SOB and RR of 40. minA to steady with RW when stepping   Modified Rankin (Stroke Patients Only) Modified Rankin (Stroke Patients Only) Pre-Morbid Rankin Score: Slight disability Modified Rankin: Moderately severe disability     Balance Overall balance assessment: Needs assistance Sitting-balance support: No upper extremity supported, Feet supported Sitting balance-Leahy Scale: Good     Standing balance support: Bilateral upper extremity supported, During functional activity Standing balance-Leahy  Scale: Poor                               Pertinent Vitals/Pain Pain Assessment Pain Assessment: No/denies  pain    Home Living Family/patient expects to be discharged to:: Private residence Living Arrangements: Spouse/significant other Available Help at Discharge: Family;Available PRN/intermittently Type of Home: House Home Access: Stairs to enter   Entrance Stairs-Number of Steps: 3   Home Layout: One level Home Equipment: Agricultural consultant (2 wheels);Cane - single point;Shower seat      Prior Function Prior Level of Function : Independent/Modified Independent;Driving             Mobility Comments: no AD use, limited to short-distance ambulation in the house, reports not typically SOB with ambulation at home ADLs Comments: ind with ADLs     Hand Dominance   Dominant Hand: Right    Extremity/Trunk Assessment   Upper Extremity Assessment Upper Extremity Assessment: Generalized weakness    Lower Extremity Assessment Lower Extremity Assessment: Overall WFL for tasks assessed (no discernable difference between L and RLE, pt denies difference in sensation.)    Cervical / Trunk Assessment Cervical / Trunk Assessment: Normal  Communication   Communication: No difficulties  Cognition Arousal/Alertness: Awake/alert Behavior During Therapy: WFL for tasks assessed/performed Overall Cognitive Status: Impaired/Different from baseline Area of Impairment: Orientation, Attention, Following commands, Safety/judgement, Problem solving, Awareness                 Orientation Level: Situation Current Attention Level: Focused   Following Commands: Follows one step commands inconsistently Safety/Judgement: Decreased awareness of deficits, Decreased awareness of safety Awareness: Intellectual Problem Solving: Slow processing, Decreased initiation, Requires verbal cues General Comments: pt with difficulty following instruction for basic tasks,        General Comments General comments (skin integrity, edema, etc.): HR to 128bpm with pivotal steps, RR to 40, SpO2 95% on 2L.     Exercises Other Exercises Other Exercises: given IS and attempted education, pt pulling <250   Assessment/Plan    PT Assessment Patient needs continued PT services  PT Problem List Decreased strength;Decreased activity tolerance;Decreased balance;Cardiopulmonary status limiting activity       PT Treatment Interventions DME instruction;Gait training;Stair training;Functional mobility training;Therapeutic activities;Therapeutic exercise;Balance training;Neuromuscular re-education    PT Goals (Current goals can be found in the Care Plan section)  Acute Rehab PT Goals Patient Stated Goal: return home PT Goal Formulation: With patient/family Time For Goal Achievement: 05/05/23 Potential to Achieve Goals: Good    Frequency Min 1X/week     Co-evaluation PT/OT/SLP Co-Evaluation/Treatment: Yes Reason for Co-Treatment: Complexity of the patient's impairments (multi-system involvement);For patient/therapist safety;To address functional/ADL transfers PT goals addressed during session: Mobility/safety with mobility;Balance;Strengthening/ROM;Proper use of DME OT goals addressed during session: ADL's and self-care;Strengthening/ROM       AM-PAC PT "6 Clicks" Mobility  Outcome Measure Help needed turning from your back to your side while in a flat bed without using bedrails?: A Little Help needed moving from lying on your back to sitting on the side of a flat bed without using bedrails?: A Lot Help needed moving to and from a bed to a chair (including a wheelchair)?: A Little Help needed standing up from a chair using your arms (e.g., wheelchair or bedside chair)?: A Little Help needed to walk in hospital room?: Total Help needed climbing 3-5 steps with a railing? : Total 6 Click Score: 13    End of  Session Equipment Utilized During Treatment: Gait belt;Oxygen Activity Tolerance: Patient tolerated treatment well;Patient limited by fatigue Patient left: in chair;with call bell/phone  within reach;with chair alarm set;with family/visitor present Nurse Communication: Mobility status PT Visit Diagnosis: Other abnormalities of gait and mobility (R26.89)    Time: 1610-9604 PT Time Calculation (min) (ACUTE ONLY): 30 min   Charges:   PT Evaluation $PT Eval Moderate Complexity: 1 Mod          Vickki Muff, PT, DPT   Acute Rehabilitation Department Office [phone] Secure Chat Communication Preferred  Ronnie Derby 04/21/2023, 10:55 AM

## 2023-04-21 NOTE — Consult Note (Signed)
Palliative Care Consult Note                                  Date: 04/21/2023   Patient Name: Peter Diaz  DOB: 12/15/1950  MRN: [id number]  Age / Sex: 72 y.o., male  PCP: Peter Blamer, MD Referring Physician: Lynnell Catalan, MD  Reason for Consultation: Establishing goals of care  HPI/Patient Profile: 72 y.o. male  with past medical history of stage IIIb non-small cell lung cancer, HTN, gout, and GERD who presented to the ED on 04/20/2023 with altered mental status.  EKG was concerning for ST elevation, code STEMI was activated, and patient was taken to the cath lab.  However head CT was negative for ICH but revealed acute stroke and cardiac cath was canceled. He has multiple acute issues including acute metabolic encephalopathy, acute left PCA infarct, STEMI, AKI, acute hypoxic respiratory failure, and possible postobstructive pneumonia.   Lung cancer was initially diagnosed in February 2023. He is status post chemotherapy and radiation with partial response. He started immunotherapy in May 2023, and is status post 12 of 13 cycles (last dose given 02/19/23). He was tolerating well until he developed a significant rash in April, and it was decided to not proceed with the last cycle of immunotherapy. Plan was for CT chest in May for restaging of his disease.   Palliative Medicine has been consulted for goals of care.  Subjective:   I have reviewed medical records including progress notes, labs and imaging, received an update from the RN, and assessed the patient at bedside. He is confused and not able to participate in GOC discussion.   I spoke with wife/Peter Diaz by phone to discuss diagnosis, prognosis, GOC, and options.  I introduced Palliative Medicine as specialized medical care for people living with serious illness. It focuses on providing relief from the symptoms and stress of a serious illness.   Patient is a retired Scientist, clinical (histocompatibility and immunogenetics).  He and Peter Diaz have been married for 37 years.  They have 1 daughter/Peter Diaz and 2 grandchildren.   Peter Diaz reports a decline in patient's functional status over the past 6 months. She reports he "sits in the recliner, watches TV, and sleeps all day".   We discussed patient's current illness and what it means in the larger context of his ongoing co-morbidities. Current clinical status was reviewed. We discussed his multiple acute issues including stroke, STEMI, acute respiratory failure, acute kidney injury, and possible pneumonia.   We also discussed that underlying his acute issues, patient has lung cancer and that this was advanced (stage IIIb) even at initial diagnosis. Peter Diaz verbalizes understanding that this is a non-curable condition.   We briefly discussed different paths of care - full scope versus limited interventions (treat the treatable) versus comfort care.  Created space and opportunity for Peter Diaz to express thoughts and feelings regarding patient's current medical situation. Values and goals of care were attempted to be elicited.  Peter Diaz verbalizes understanding of the seriousness of patient's current medical situation and that his prognosis is very guarded.  She shares that family is still trying to process everything and determine next steps.  Peter Diaz shares that she relies heavily on patient's brother/Peter Diaz for decision making and would like [place] to meet together.   I later stopped by the room to drop of my card as well as a "hard choices" book. I was able to introduce myself to [facility]. I  encouraged him to read chapter about CPR so that we could discuss code status tomorrow.     Review of Systems  Unable to perform ROS   Objective:   Primary Diagnoses: Present on Admission:  STEMI (ST elevation myocardial infarction) [facility])   Physical Exam Vitals reviewed.  Constitutional:      General: He is not in acute distress. Pulmonary:     Effort: Pulmonary effort  is normal.  Neurological:     Mental Status: He is alert. He is confused.     Vital Signs:  BP 117/78   Pulse (!) 124   Temp 97.9 F (36.6 C) (Oral)   Resp (!) 27   Ht 5' 7.99" (1.727 m)   Wt 77 kg   SpO2 97%   BMI 25.82 kg/m   Palliative Assessment/Data: PPS 30%     Assessment & Plan:   SUMMARY OF RECOMMENDATIONS   Full code full scope for now Family meeting tomorrow at 12 noon  Primary Decision Maker: NEXT OF KIN - wife/Peter Diaz  Prognosis:  Unable to determine  Discharge Planning:  To Be Determined    Thank you for allowing [place] to participate in the care of Peter Diaz  MDM - High   Signed by: Peter Foot, NP Palliative Medicine Team  Team Phone # [phone]  For individual providers, please see AMION

## 2023-04-21 NOTE — Progress Notes (Signed)
Patient may need to return for contrast imaging of MRA Neck. Severe software failure caused improper imaging and no capture of the carotid arteries with bolus.

## 2023-04-21 NOTE — Progress Notes (Signed)
ANTICOAGULATION CONSULT NOTE - Initial Consult  Pharmacy Consult for Heparin Indication: chest pain/ACS  Allergies  Allergen Reactions   Marinol [Dronabinol] Other (See Comments)    "Feels bloated. I don' t ever want to take it "    Patient Measurements: Weight: 77 kg (169 lb 12.1 oz) Heparin Dosing Weight: 77 kg  Vital Signs: Temp: 97.8 F (36.6 C) (05/13 0415) Temp Source: Oral (05/13 0415) BP: 102/66 (05/13 0400) Pulse Rate: 111 (05/13 0400)  Labs: Recent Labs    04/20/23 1522 04/21/23 0137  HGB 11.3* 11.8*  HCT 37.2* 38.3*  PLT 252 279  APTT 31  --   LABPROT 16.5*  --   INR 1.3*  --   CREATININE 3.06* 2.61*  TROPONINIHS >24,000*  --     Estimated Creatinine Clearance: 24.8 mL/min (A) (by C-G formula based on SCr of 2.61 mg/dL (H)).   Medical History: Past Medical History:  Diagnosis Date   GERD (gastroesophageal reflux disease)    Gout    History of radiation therapy    Left lung- (01/28/22-03/08/22)- Dr. Antony Blackbird   HTN (hypertension)     Assessment: Peter Diaz is a 72 yo M PMH significant for NSCLC presenting with AMS. On presentation, EKG showing STEMI, MRI showing acute PCA infarct. Cath deferred in the setting of active stroke. Pharmacy has been consulted for heparin x48h for medical management of ACS.  Plan for low goal with no bolus due to acute stroke. Hgb 11.8, stable, plts wnl. No s/s of bleeding noted.   Goal of Therapy:  Heparin level 0.3-0.5 Monitor platelets by anticoagulation protocol: Yes   Plan:  Start heparin 800 units/hr Collect heparin level in 8h Monitor daily heparin level, CBC, s/s of bleeding  Rennis Petty, PharmD PGY1 Pharmacy Resident 04/21/2023 8:21 AM

## 2023-04-21 NOTE — Evaluation (Signed)
Clinical/Bedside Swallow Evaluation Patient Details  Name: Peter Diaz MRN: [id number] Date of Birth: 04-Dec-1951  Today's Date: 04/21/2023 Time: SLP Start Time (ACUTE ONLY): 1155 SLP Stop Time (ACUTE ONLY): 1223 SLP Time Calculation (min) (ACUTE ONLY): 28 min  Past Medical History:  Past Medical History:  Diagnosis Date   GERD (gastroesophageal reflux disease)    Gout    History of radiation therapy    Left lung- (01/28/22-03/08/22)- Dr. Antony Blackbird   HTN (hypertension)    Past Surgical History:  Past Surgical History:  Procedure Laterality Date   BIOPSY  01/11/2022   Procedure: BIOPSY;  Surgeon: Josephine Igo, DO;  Location: [facility];  Service: Pulmonary;;   BRAIN SURGERY     BRONCHIAL BRUSHINGS  01/11/2022   Procedure: BRONCHIAL BRUSHINGS;  Surgeon: Josephine Igo, DO;  Location: [facility];  Service: Pulmonary;;   BRONCHIAL NEEDLE ASPIRATION BIOPSY  01/11/2022   Procedure: BRONCHIAL NEEDLE ASPIRATION BIOPSIES;  Surgeon: Josephine Igo, DO;  Location: [facility];  Service: Pulmonary;;   VIDEO BRONCHOSCOPY WITH ENDOBRONCHIAL ULTRASOUND Left 01/11/2022   Procedure: VIDEO BRONCHOSCOPY WITH ENDOBRONCHIAL ULTRASOUND;  Surgeon: Josephine Igo, DO;  Location: [facility];  Service: Pulmonary;  Laterality: Left;   HPI:  The pt is a 72 yo male presenting 5/12 with AMS, weakness, and recent falls. Work up showed STEMI and acute infarct of left PCA territory involving the left occipital and medial temporal lobes. PMH includes: GERD, gout, HTN, and lung cancer on immunotherapy.    Assessment / Plan / Recommendation  Clinical Impression  Pt seen for clinical swallowing evaluation with delayed cough noted with ice chips and after all consumption of various consistencies during evaluation.  Timely swallow and adequte mastication/oral preparation noted.  Pt's wife stated baseline cough is "typical" for him.  This occurred also prior to and after consumption of small trial and intake  did not worsen cough or alter vocal intensity/quality.  Pt has a history of lung CA with compromised respiratory system and wife stated "he needs to be on oxygen" but was not prior to hospitalization.  No overt s/sx of aspiration observed during small trial, but due to pt's respiratory status and new stroke/deconditioning, he is at risk for aspiration and silent aspiration cannot be ruled out at bedside.  Recommend initiating a mechanical soft/thin liquid diet with precautions in place for decreasing environmental distractions, small bites/sips and slow rate with A during meals and FULL precautions.  If pt decompensates during meal, may consider downgrading or making pt NPO until objective assessment can be completed.  ST will f/u for diet tolerance and education along with cognitive/linguistic changes noted in speech/language assessment.  Thank you for this consult. SLP Visit Diagnosis: Dysphagia, unspecified (R13.10)    Aspiration Risk  Mild aspiration risk    Diet Recommendation   Dysphagia 3/thin liquids  Medication Administration: Whole meds with puree    Other  Recommendations Oral Care Recommendations: Oral care BID;Staff/trained caregiver to provide oral care    Recommendations for follow up therapy are one component of a multi-disciplinary discharge planning process, led by the attending physician.  Recommendations may be updated based on patient status, additional functional criteria and insurance authorization.  Follow up Recommendations Follow physician's recommendations for discharge plan and follow up therapies      Assistance Recommended at Discharge  Full  Functional Status Assessment Patient has had a recent decline in their functional status and demonstrates the ability to make significant improvements in function in a  reasonable and predictable amount of time.  Frequency and Duration min 2x/week  1 week       Prognosis Prognosis for improved oropharyngeal function: Good       Swallow Study   General HPI: The pt is a 72 yo male presenting 5/12 with AMS, weakness, and recent falls. Work up showed STEMI and acute infarct of left PCA territory involving the left occipital and medial temporal lobes. PMH includes: GERD, gout, HTN, and lung cancer on immunotherapy. Type of Study: Bedside Swallow Evaluation Previous Swallow Assessment: n/a Diet Prior to this Study: NPO Temperature Spikes Noted: No Respiratory Status: Nasal cannula (5L) History of Recent Intubation: No Behavior/Cognition: Alert;Confused;Distractible Oral Cavity Assessment: Dry Oral Care Completed by SLP: Yes Oral Cavity - Dentition: Adequate natural dentition Vision: Functional for self-feeding Self-Feeding Abilities: Needs assist Patient Positioning: Upright in bed Baseline Vocal Quality: Normal Volitional Cough: Strong Volitional Swallow: Able to elicit    Oral/Motor/Sensory Function Overall Oral Motor/Sensory Function: Mild impairment Facial Symmetry: Within Functional Limits Facial Strength: Within Functional Limits Facial Sensation: Reduced left Lingual ROM: Within Functional Limits Lingual Symmetry: Within Functional Limits Lingual Strength: Within Functional Limits   Ice Chips Ice chips: Impaired Presentation: Spoon Pharyngeal Phase Impairments: Cough - Delayed   Thin Liquid Thin Liquid: Impaired Presentation: Cup;Spoon;Straw Pharyngeal  Phase Impairments: Cough - Delayed (larger volume; impulsive)    Nectar Thick Nectar Thick Liquid: Not tested   Honey Thick Honey Thick Liquid: Not tested   Puree Puree: Within functional limits Presentation: Spoon   Solid     Solid: Within functional limits Presentation: Spoon      Pat Oneka Parada,M.S., CCC-SLP 04/21/2023,2:13 PM

## 2023-04-22 ENCOUNTER — Other Ambulatory Visit (HOSPITAL_COMMUNITY): Payer: Self-pay

## 2023-04-22 ENCOUNTER — Telehealth (HOSPITAL_COMMUNITY): Payer: Self-pay | Admitting: Pharmacy Technician

## 2023-04-22 DIAGNOSIS — K921 Melena: Secondary | ICD-10-CM

## 2023-04-22 DIAGNOSIS — R5381 Other malaise: Secondary | ICD-10-CM | POA: Diagnosis not present

## 2023-04-22 DIAGNOSIS — I63432 Cerebral infarction due to embolism of left posterior cerebral artery: Secondary | ICD-10-CM | POA: Diagnosis not present

## 2023-04-22 DIAGNOSIS — C3490 Malignant neoplasm of unspecified part of unspecified bronchus or lung: Secondary | ICD-10-CM

## 2023-04-22 DIAGNOSIS — I213 ST elevation (STEMI) myocardial infarction of unspecified site: Secondary | ICD-10-CM | POA: Diagnosis not present

## 2023-04-22 DIAGNOSIS — I2102 ST elevation (STEMI) myocardial infarction involving left anterior descending coronary artery: Secondary | ICD-10-CM | POA: Diagnosis not present

## 2023-04-22 DIAGNOSIS — N179 Acute kidney failure, unspecified: Secondary | ICD-10-CM | POA: Diagnosis not present

## 2023-04-22 DIAGNOSIS — I63532 Cerebral infarction due to unspecified occlusion or stenosis of left posterior cerebral artery: Secondary | ICD-10-CM | POA: Diagnosis not present

## 2023-04-22 DIAGNOSIS — J9601 Acute respiratory failure with hypoxia: Secondary | ICD-10-CM | POA: Diagnosis not present

## 2023-04-22 LAB — BASIC METABOLIC PANEL
Anion gap: 14 (ref 5–15)
BUN: 51 mg/dL — ABNORMAL HIGH (ref 8–23)
CO2: 18 mmol/L — ABNORMAL LOW (ref 22–32)
Calcium: 7.5 mg/dL — ABNORMAL LOW (ref 8.9–10.3)
Chloride: 103 mmol/L (ref 98–111)
Creatinine, Ser: 2.19 mg/dL — ABNORMAL HIGH (ref 0.61–1.24)
GFR, Estimated: 31 mL/min — ABNORMAL LOW (ref >60)
Glucose, Bld: 90 mg/dL (ref 70–99)
Potassium: 3.7 mmol/L (ref 3.5–5.1)
Sodium: 135 mmol/L (ref 135–145)

## 2023-04-22 LAB — HEPARIN LEVEL (UNFRACTIONATED): Heparin Unfractionated: <0.1 IU/mL — ABNORMAL LOW (ref 0.30–0.70)

## 2023-04-22 LAB — CBC
HCT: 32.2 % — ABNORMAL LOW (ref 39.0–52.0)
Hemoglobin: 10 g/dL — ABNORMAL LOW (ref 13.0–17.0)
MCH: 26.4 pg (ref 26.0–34.0)
MCHC: 31.1 g/dL (ref 30.0–36.0)
MCV: 85 fL (ref 80.0–100.0)
Platelets: 320 10*3/uL (ref 150–400)
RBC: 3.79 MIL/uL — ABNORMAL LOW (ref 4.22–5.81)
RDW: 15.4 % (ref 11.5–15.5)
WBC: 15.3 10*3/uL — ABNORMAL HIGH (ref 4.0–10.5)
nRBC: 0 % (ref 0.0–0.2)

## 2023-04-22 LAB — CULTURE, BLOOD (ROUTINE X 2)

## 2023-04-22 LAB — HEMOGLOBIN AND HEMATOCRIT, BLOOD
HCT: 34.6 % — ABNORMAL LOW (ref 39.0–52.0)
Hemoglobin: 10.5 g/dL — ABNORMAL LOW (ref 13.0–17.0)

## 2023-04-22 LAB — BRAIN NATRIURETIC PEPTIDE: B Natriuretic Peptide: 1985.7 pg/mL — ABNORMAL HIGH (ref 0.0–100.0)

## 2023-04-22 MED ORDER — LORAZEPAM 2 MG/ML IJ SOLN
1.0000 mg | INTRAMUSCULAR | Status: DC | PRN
  Administered 2023-04-23: 2 mg via INTRAVENOUS
  Filled 2023-04-22: qty 1

## 2023-04-22 MED ORDER — POTASSIUM CHLORIDE CRYS ER 20 MEQ PO TBCR
40.0000 meq | EXTENDED_RELEASE_TABLET | Freq: Once | ORAL | Status: AC
  Administered 2023-04-22: 40 meq via ORAL
  Filled 2023-04-22: qty 2

## 2023-04-22 MED ORDER — POTASSIUM CHLORIDE CRYS ER 20 MEQ PO TBCR: 40.0000 meq | EXTENDED_RELEASE_TABLET | Freq: Two times a day (BID) | ORAL | Status: DC

## 2023-04-22 MED ORDER — GERHARDT'S BUTT CREAM: TOPICAL_CREAM | CUTANEOUS | Status: DC | PRN

## 2023-04-22 MED ORDER — LOPERAMIDE HCL 2 MG PO CAPS
2.0000 mg | ORAL_CAPSULE | ORAL | Status: DC | PRN
  Administered 2023-04-22: 2 mg via ORAL
  Filled 2023-04-22: qty 1

## 2023-04-22 MED ORDER — GLYCOPYRROLATE 0.2 MG/ML IJ SOLN: 0.2000 mg | INTRAMUSCULAR | Status: DC | PRN

## 2023-04-22 MED ORDER — PANTOPRAZOLE SODIUM 40 MG IV SOLR
40.0000 mg | Freq: Two times a day (BID) | INTRAVENOUS | Status: DC
  Administered 2023-04-22: 40 mg via INTRAVENOUS
  Filled 2023-04-22: qty 10

## 2023-04-22 MED ORDER — SPIRONOLACTONE 25 MG PO TABS: 25.0000 mg | ORAL_TABLET | Freq: Every day | ORAL | Status: DC

## 2023-04-22 MED ORDER — POLYVINYL ALCOHOL 1.4 % OP SOLN: 1.0000 [drp] | Freq: Four times a day (QID) | OPHTHALMIC | Status: DC | PRN

## 2023-04-22 MED ORDER — SODIUM CHLORIDE (PF) 0.9 % IJ SOLN
INTRAMUSCULAR | Status: AC
  Administered 2023-04-22: 10 mL
  Filled 2023-04-22: qty 10

## 2023-04-22 MED ORDER — MORPHINE SULFATE (PF) 2 MG/ML IV SOLN
2.0000 mg | INTRAVENOUS | Status: DC | PRN
  Administered 2023-04-24: 2 mg via INTRAVENOUS
  Filled 2023-04-22: qty 1

## 2023-04-22 MED ORDER — SPIRONOLACTONE 12.5 MG HALF TABLET: 12.5000 mg | ORAL_TABLET | Freq: Every day | ORAL | Status: DC

## 2023-04-22 MED ORDER — GLYCOPYRROLATE 1 MG PO TABS: 1.0000 mg | ORAL_TABLET | ORAL | Status: DC | PRN

## 2023-04-22 NOTE — Consult Note (Signed)
Physical Medicine and Rehabilitation Consult Reason for Consult:impaired functional mobility, confusion Referring Physician: Agarwala   HPI: Peter Diaz is a 72 y.o. male with a history of stage IIIb NSC lung cancer and prior left frontal lobe abscess 22 years ago, HTN who presented with on 04/20/23 with altered mental status. EKG revealed STEMI. He was also found to be in acute renal failure. MRI of brain ultimately revealed an acute infarct in the left occipital and medial temporal lobes as well as smaller ischemic infarcts in the right frontal and occipital lobes. MRA notable for occlusion of left PCA P4 segment and moderate stenosis of distal P2 segment of right PCA. Neurology felt that infarcts were likely d/t hypercoagulable state vs cardioembolic from STEMI/CM. Pt was placed on ASA 81mg  and IV heparin with plan to transition to eliquis today. Cardiology recommends adding plavix and not to pursue aggressive cardiac evaluation given lung CA and CVA. CT of abdomen and chest do not reveal any progression of cancer. Renal function improving. Pt was evaluated by therapy yesterday and needed min physical assist for sit to stand transfers. He did not ambulate. Activity was somewhat limited by tachypnea and poor endurance. Prior to arrival pt was independent with mobility and self-care although a bit limited in distance due to pulmonary capacity. He lives in a one level home with 3 steps to enter. Spouse/family available.    Review of Systems  Constitutional:  Positive for malaise/fatigue.  HENT: Negative.    Eyes:  Negative for blurred vision and double vision.  Respiratory:  Positive for cough and shortness of breath.   Cardiovascular:  Positive for chest pain.  Gastrointestinal: Negative.   Genitourinary:  Negative for dysuria.  Musculoskeletal:  Negative for myalgias.  Skin: Negative.   Neurological:  Positive for weakness.  Psychiatric/Behavioral:  Positive for memory loss.     Past Medical History:  Diagnosis Date   GERD (gastroesophageal reflux disease)    Gout    History of radiation therapy    Left lung- (01/28/22-03/08/22)- Dr. Antony Blackbird   HTN (hypertension)    Past Surgical History:  Procedure Laterality Date   BIOPSY  01/11/2022   Procedure: BIOPSY;  Surgeon: Josephine Igo, DO;  Location: [facility];  Service: Pulmonary;;   BRAIN SURGERY     BRONCHIAL BRUSHINGS  01/11/2022   Procedure: BRONCHIAL BRUSHINGS;  Surgeon: Josephine Igo, DO;  Location: [facility];  Service: Pulmonary;;   BRONCHIAL NEEDLE ASPIRATION BIOPSY  01/11/2022   Procedure: BRONCHIAL NEEDLE ASPIRATION BIOPSIES;  Surgeon: Josephine Igo, DO;  Location: [facility];  Service: Pulmonary;;   VIDEO BRONCHOSCOPY WITH ENDOBRONCHIAL ULTRASOUND Left 01/11/2022   Procedure: VIDEO BRONCHOSCOPY WITH ENDOBRONCHIAL ULTRASOUND;  Surgeon: Josephine Igo, DO;  Location: [facility];  Service: Pulmonary;  Laterality: Left;   Family History  Problem Relation Age of Onset   Stroke Mother    Stroke Father    Cancer Sister    Social History:  reports that he quit smoking about 16 months ago. His smoking use included cigarettes. He smoked an average of .8 packs per day. He has never used smokeless tobacco. He reports current alcohol use. He reports that he does not use drugs. Allergies:  Allergies  Allergen Reactions   Marinol [Dronabinol] Other (See Comments)    "Feels bloated. I don' t ever want to take it "   Medications Prior to Admission  Medication Sig Dispense Refill   amLODipine (NORVASC) 10 MG tablet Take  10 mg by mouth daily.     esomeprazole (NEXIUM) 20 MG capsule Take 20 mg by mouth daily at 12 noon.     mirtazapine (REMERON) 15 MG tablet TAKE 1 TABLET(15 MG) BY MOUTH AT BEDTIME (Patient taking differently: Take 15 mg by mouth at bedtime.) 30 tablet 2   predniSONE (DELTASONE) 10 MG tablet Take 7 tablets (70) mg daily for 2 weeks, then take 5 tablets (50) mg daily for 1 week,  followed by 3 tablets (30 mg) daily for 1 week, and then take 1 tablet daily for 7 days. Then stop 161 tablet 0   albuterol (VENTOLIN HFA) 108 (90 Base) MCG/ACT inhaler INHALE 2 PUFFS INTO THE LUNGS EVERY 6 HOURS AS NEEDED FOR WHEEZING OR SHORTNESS OF BREATH 6.7 g 5   ibuprofen (ADVIL) 200 MG tablet Take 200 mg by mouth as needed.     tamsulosin (FLOMAX) 0.4 MG CAPS capsule Take 0.4 mg by mouth at bedtime.      Home: Home Living Family/patient expects to be discharged to:: Private residence Living Arrangements: Spouse/significant other Available Help at Discharge: Family, Available PRN/intermittently Type of Home: House Home Access: Stairs to enter Secretary/administrator of Steps: 3 Home Layout: One level Bathroom Shower/Tub: Health visitor: Standard Home Equipment: Agricultural consultant (2 wheels), [employer] - single point, Information systems manager  Lives With: Spouse  Functional History: Prior Function Prior Level of Function : Independent/Modified Independent, Driving Mobility Comments: no AD use ADLs Comments: ind with ADLs Functional Status:  Mobility: Bed Mobility Overal bed mobility: Needs Assistance Bed Mobility: Supine to Sit Supine to sit: Mod assist General bed mobility comments: minA to LE to reach EOB, modA to elevate trunk, pt pulling up on therapist. SOB after exertion with HR 120s Transfers Overall transfer level: Needs assistance Equipment used: Rolling walker (2 wheels) Transfers: Sit to/from Stand, Bed to chair/wheelchair/BSC Sit to Stand: Min assist Bed to/from chair/wheelchair/BSC transfer type:: Step pivot Step pivot transfers: Min assist General transfer comment: minA to rise and steady, pt managing small lateral steps to recliner with minA to steady. HR to 128bpm and RR to 40 Ambulation/Gait General Gait Details: limited to small lateral steps to recliner due to SOB and RR of 40. minA to steady with RW when stepping    ADL: ADL Overall ADL's : Needs  assistance/impaired Eating/Feeding: Set up, Sitting Grooming: Set up, Sitting Upper Body Bathing: Moderate assistance, Sitting Lower Body Bathing: Moderate assistance, Sitting/lateral leans Upper Body Dressing : Moderate assistance, Sitting Lower Body Dressing: Moderate assistance, Sitting/lateral leans Toilet Transfer: Minimal assistance, Stand-pivot, Rolling walker (2 wheels), BSC/3in1 Toileting- Clothing Manipulation and Hygiene: Moderate assistance Functional mobility during ADLs: Minimal assistance, Rolling walker (2 wheels)  Cognition: Cognition Overall Cognitive Status: Impaired/Different from baseline Arousal/Alertness: Awake/alert Orientation Level: Oriented to person, Disoriented to place, Disoriented to time, Disoriented to situation Month: September Day of Week: Incorrect Attention: Sustained Sustained Attention: Impaired Sustained Attention Impairment: Verbal basic, Functional basic Memory: Impaired Memory Impairment: Retrieval deficit, Decreased recall of new information, Decreased short term memory Decreased Short Term Memory: Verbal basic, Functional basic Awareness: Impaired Awareness Impairment: Intellectual impairment Problem Solving: Impaired Problem Solving Impairment: Verbal basic, Functional basic Behaviors: Impulsive Safety/Judgment: Impaired Comments: Pt stated he could get out of the bed to use the bathroom and didn't need help Cognition Arousal/Alertness: Awake/alert Behavior During Therapy: WFL for tasks assessed/performed Overall Cognitive Status: Impaired/Different from baseline Area of Impairment: Orientation, Attention, Following commands, Safety/judgement, Problem solving, Awareness Orientation Level: Situation Current Attention Level: Focused  Following Commands: Follows one step commands inconsistently Safety/Judgement: Decreased awareness of deficits, Decreased awareness of safety Awareness: Intellectual Problem Solving: Slow processing,  Decreased initiation, Requires verbal cues General Comments: pt with difficulty following instruction for basic tasks,  Blood pressure 102/64, pulse (!) 106, temperature 97.8 F (36.6 C), temperature source Oral, resp. rate (!) 25, height 5' 7.99" (1.727 m), weight 77.5 kg, SpO2 100 %. Physical Exam Constitutional:      Appearance: He is obese. He is not ill-appearing.  HENT:     Head: Normocephalic.     Nose: Nose normal.  Eyes:     Extraocular Movements: Extraocular movements intact.     Conjunctiva/sclera: Conjunctivae normal.     Pupils: Pupils are equal, round, and reactive to light.  Cardiovascular:     Rate and Rhythm: Regular rhythm. Tachycardia present.  Pulmonary:     Effort: Pulmonary effort is normal.     Comments: Intermittent cough Abdominal:     General: There is no distension.  Musculoskeletal:        General: No swelling.     Cervical back: Normal range of motion.  Skin:    General: Skin is warm.  Neurological:     Mental Status: He is alert.     Comments: Alert. Oriented to name, hospital with cues, not month/year. Provided some biographical information. Poor insight and awareness. Right HH on visual field testing. No other gross CN findings. Motor 4/5 UE's with decreased FMC bilaterally with mild limb ataxia bilaterally. LE 3/5 prox to 4/5 distally. No focal sensory abnl. DTR's 1+. No abnl resting tone.      Results for orders placed or performed during the hospital encounter of 04/20/23 (from the past 24 hour(s))  Lactic acid, plasma     Status: None   Collection Time: 04/21/23  2:50 PM  Result Value Ref Range   Lactic Acid, Venous 1.4 0.5 - 1.9 mmol/L  Heparin level (unfractionated)     Status: Abnormal   Collection Time: 04/21/23  3:57 PM  Result Value Ref Range   Heparin Unfractionated <0.10 (L) 0.30 - 0.70 IU/mL  Hemoglobin and hematocrit, blood     Status: Abnormal   Collection Time: 04/21/23  7:55 PM  Result Value Ref Range   Hemoglobin 11.7 (L)  13.0 - 17.0 g/dL   HCT 16.1 (L) 09.6 - 04.5 %  CBC     Status: Abnormal   Collection Time: 04/22/23  2:10 AM  Result Value Ref Range   WBC 15.3 (H) 4.0 - 10.5 K/uL   RBC 3.79 (L) 4.22 - 5.81 MIL/uL   Hemoglobin 10.0 (L) 13.0 - 17.0 g/dL   HCT 40.9 (L) 81.1 - 91.4 %   MCV 85.0 80.0 - 100.0 fL   MCH 26.4 26.0 - 34.0 pg   MCHC 31.1 30.0 - 36.0 g/dL   RDW 78.2 95.6 - 21.3 %   Platelets 320 150 - 400 K/uL   nRBC 0.0 0.0 - 0.2 %  Basic metabolic panel     Status: Abnormal   Collection Time: 04/22/23  2:10 AM  Result Value Ref Range   Sodium 135 135 - 145 mmol/L   Potassium 3.7 3.5 - 5.1 mmol/L   Chloride 103 98 - 111 mmol/L   CO2 18 (L) 22 - 32 mmol/L   Glucose, Bld 90 70 - 99 mg/dL   BUN 51 (H) 8 - 23 mg/dL   Creatinine, Ser 0.86 (H) 0.61 - 1.24 mg/dL   Calcium 7.5 (L) 8.9 -  10.3 mg/dL   GFR, Estimated 31 (L) >60 mL/min   Anion gap 14 5 - 15  Brain natriuretic peptide     Status: Abnormal   Collection Time: 04/22/23  2:10 AM  Result Value Ref Range   B Natriuretic Peptide 1,985.7 (H) 0.0 - 100.0 pg/mL  Heparin level (unfractionated)     Status: Abnormal   Collection Time: 04/22/23  6:13 AM  Result Value Ref Range   Heparin Unfractionated <0.10 (L) 0.30 - 0.70 IU/mL  Hemoglobin and hematocrit, blood     Status: Abnormal   Collection Time: 04/22/23  6:13 AM  Result Value Ref Range   Hemoglobin 10.5 (L) 13.0 - 17.0 g/dL   HCT 16.1 (L) 09.6 - 04.5 %   CT ABDOMEN PELVIS WO CONTRAST  Result Date: 04/21/2023 CLINICAL DATA:  Non-small cell lung cancer staging. EXAM: CT ABDOMEN AND PELVIS WITHOUT CONTRAST TECHNIQUE: Multidetector CT imaging of the abdomen and pelvis was performed following the standard protocol without IV contrast. RADIATION DOSE REDUCTION: This exam was performed according to the departmental dose-optimization program which includes automated exposure control, adjustment of the mA and/or kV according to patient size and/or use of iterative reconstruction technique.  COMPARISON:  PET-CT 01/08/2022 FINDINGS: Lower chest: Small right and moderate left pleural effusions are partially visualized. Hepatobiliary: Gallstones are present. There is no biliary ductal dilatation. The liver is within normal limits. Pancreas: There is a 2.6 x 1.7 cm low-attenuation area abutting and anterior to the pancreatic head image 3/37, likely cystic. Otherwise, the pancreas is within normal limits. Spleen: Normal in size without focal abnormality. Adrenals/Urinary Tract: Adrenal glands are unremarkable. Kidneys are normal, without renal calculi, focal lesion, or hydronephrosis. Bladder is unremarkable. Stomach/Bowel: Stomach is within normal limits. Appendix appears normal. No evidence of bowel wall thickening, distention, or inflammatory changes. Vascular/Lymphatic: Aortic atherosclerosis. No enlarged abdominal or pelvic lymph nodes. Reproductive: Prostate gland is enlarged. Other: There is a small fat containing right inguinal hernia. There is no ascites. Musculoskeletal: No acute or significant osseous findings. IMPRESSION: 1. No evidence for metastatic disease in the abdomen or pelvis. 2. Moderate left and small right pleural effusions. 3. Cholelithiasis. 4. 2.6 cm low-attenuation area abutting and anterior to the pancreatic head, likely cystic. Recommend further evaluation with MRI. Aortic Atherosclerosis (ICD10-I70.0). Electronically Signed   By: Darliss Cheney M.D.   On: 04/21/2023 21:26   VAS [place] LOWER EXTREMITY VENOUS (DVT)  Result Date: 04/21/2023  Lower Venous DVT Study Patient Name:  STONEWALL CATTANI  Date of Exam:   04/21/2023 Medical Rec #: [id number]       Accession #:    [id number] Date of Birth: [birth year]       Patient Gender: M Patient Age:   39 years Exam Location:  [facility] Procedure:      VAS [place] LOWER EXTREMITY VENOUS (DVT) Referring Phys: Levon Hedger --------------------------------------------------------------------------------  Indications: Stroke. Embolic.  Risk  Factors: Cancer - lung. Comparison Study: No prior studies. Performing Technologist: Jean Rosenthal RDMS, RVT  Examination Guidelines: A complete evaluation includes B-mode imaging, spectral Doppler, color Doppler, and power Doppler as needed of all accessible portions of each vessel. Bilateral testing is considered an integral part of a complete examination. Limited examinations for reoccurring indications may be performed as noted. The reflux portion of the exam is performed with the patient in reverse Trendelenburg.  +---------+---------------+---------+-----------+---------------+-------------+ RIGHT    CompressibilityPhasicitySpontaneityProperties     Thrombus  Aging         +---------+---------------+---------+-----------+---------------+-------------+ CFV      Full           Yes      Yes                                     +---------+---------------+---------+-----------+---------------+-------------+ SFJ      Full                                                            +---------+---------------+---------+-----------+---------------+-------------+ FV Prox  Full                                                            +---------+---------------+---------+-----------+---------------+-------------+ FV Mid   Partial        Yes      Yes        brightly       Chronic                                                   echogenic                    +---------+---------------+---------+-----------+---------------+-------------+ FV DistalFull                                                            +---------+---------------+---------+-----------+---------------+-------------+ PFV      Full                                                            +---------+---------------+---------+-----------+---------------+-------------+ POP      Full           Yes      Yes                                      +---------+---------------+---------+-----------+---------------+-------------+ PTV      Full                                                            +---------+---------------+---------+-----------+---------------+-------------+ PERO     Full                                                            +---------+---------------+---------+-----------+---------------+-------------+   +---------+---------------+---------+-----------+----------+--------------+  LEFT     CompressibilityPhasicitySpontaneityPropertiesThrombus Aging +---------+---------------+---------+-----------+----------+--------------+ CFV      Full           Yes      Yes                                 +---------+---------------+---------+-----------+----------+--------------+ SFJ      Full                                                        +---------+---------------+---------+-----------+----------+--------------+ FV Prox  Full                                                        +---------+---------------+---------+-----------+----------+--------------+ FV Mid   Full                                                        +---------+---------------+---------+-----------+----------+--------------+ FV DistalFull                                                        +---------+---------------+---------+-----------+----------+--------------+ PFV      Full                                                        +---------+---------------+---------+-----------+----------+--------------+ POP      Full           Yes      Yes                                 +---------+---------------+---------+-----------+----------+--------------+ PTV      Full                                                        +---------+---------------+---------+-----------+----------+--------------+ PERO     Full                                                         +---------+---------------+---------+-----------+----------+--------------+     Summary: RIGHT: - Findings consistent with chronic deep vein thrombosis involving the right femoral vein.  - No cystic structure found in the popliteal fossa.  LEFT: - There is no evidence of deep vein thrombosis in the lower extremity.  - No  cystic structure found in the popliteal fossa.  *See table(s) above for measurements and observations. Electronically signed by Sherald Hess MD on 04/21/2023 at 3:17:03 PM.    Final    ECHOCARDIOGRAM COMPLETE  Result Date: 04/21/2023    ECHOCARDIOGRAM REPORT   Patient Name:   CLELAND GEHRIS Date of Exam: 04/21/2023 Medical Rec #:  [id number]      Height:       68.0 in Accession #:    [id number]     Weight:       169.8 lb Date of Birth:  [birth year]      BSA:          1.906 m Patient Age:    71 years       BP:           102/66 mmHg Patient Gender: M              HR:           111 bpm. Exam Location:  Inpatient Procedure: 2D Echo, Cardiac Doppler, Color Doppler and Intracardiac            Opacification Agent Indications:    Acute Myocardial Infarction  History:        Patient has no prior history of Echocardiogram examinations.                 Risk Factors:Hypertension and Former Smoker.  Sonographer:    Raeford Razor Sonographer#2:  Irving Burton Senior Referring Phys: [id number] Lorin Glass  Sonographer Comments: Image acquisition challenging due to patient body habitus. IMPRESSIONS  1. Akinesis of the anteroseptal wall and apex with overall severe LV dysfunction.  2. Left ventricular ejection fraction, by estimation, is 25 to 30%. The left ventricle has severely decreased function. The left ventricle demonstrates regional wall motion abnormalities (see scoring diagram/findings for description). Left ventricular diastolic parameters are indeterminate.  3. Right ventricular systolic function is normal. The right ventricular size is normal.  4. A small pericardial effusion is present.  5. The mitral valve is  normal in structure. Mild mitral valve regurgitation. No evidence of mitral stenosis.  6. The aortic valve is tricuspid. Aortic valve regurgitation is not visualized. Aortic valve sclerosis is present, with no evidence of aortic valve stenosis.  7. The inferior vena cava is normal in size with greater than 50% respiratory variability, suggesting right atrial pressure of 3 mmHg. Comparison(s): No prior Echocardiogram. FINDINGS  Left Ventricle: Left ventricular ejection fraction, by estimation, is 25 to 30%. The left ventricle has severely decreased function. The left ventricle demonstrates regional wall motion abnormalities. Definity contrast agent was given IV to delineate the left ventricular endocardial borders. The left ventricular internal cavity size was normal in size. There is no left ventricular hypertrophy. Left ventricular diastolic parameters are indeterminate. Right Ventricle: The right ventricular size is normal. Right ventricular systolic function is normal. Left Atrium: Left atrial size was normal in size. Right Atrium: Right atrial size was normal in size. Pericardium: A small pericardial effusion is present. Mitral Valve: The mitral valve is normal in structure. Mild mitral valve regurgitation. No evidence of mitral valve stenosis. Tricuspid Valve: The tricuspid valve is normal in structure. Tricuspid valve regurgitation is trivial. No evidence of tricuspid stenosis. Aortic Valve: The aortic valve is tricuspid. Aortic valve regurgitation is not visualized. Aortic valve sclerosis is present, with no evidence of aortic valve stenosis. Aortic valve mean gradient measures 2.0 mmHg. Aortic valve peak gradient measures 4.1  mmHg. Aortic valve  area, by VTI measures 3.00 cm. Pulmonic Valve: The pulmonic valve was normal in structure. Pulmonic valve regurgitation is not visualized. No evidence of pulmonic stenosis. Aorta: The aortic root is normal in size and structure. Venous: The inferior vena cava is  normal in size with greater than 50% respiratory variability, suggesting right atrial pressure of 3 mmHg. IAS/Shunts: No atrial level shunt detected by color flow Doppler. Additional Comments: Akinesis of the anteroseptal wall and apex with overall severe LV dysfunction.  LEFT VENTRICLE PLAX 2D LVIDd:         5.45 cm      Diastology LVIDs:         4.80 cm      LV e' medial:    13.40 cm/s LV PW:         0.80 cm      LV E/e' medial:  7.0 LV IVS:        0.95 cm      LV e' lateral:   9.00 cm/s LVOT diam:     2.20 cm      LV E/e' lateral: 10.4 LV SV:         41 LV SV Index:   22 LVOT Area:     3.80 cm  LV Volumes (MOD) LV vol d, MOD A2C: 135.0 ml LV vol d, MOD A4C: 105.0 ml LV vol s, MOD A2C: 97.8 ml LV vol s, MOD A4C: 63.4 ml LV SV MOD A2C:     37.2 ml LV SV MOD A4C:     105.0 ml RIGHT VENTRICLE RV S prime:     7.00 cm/s TAPSE (M-mode): 2.4 cm LEFT ATRIUM           Index LA diam:      3.67 cm 1.93 cm/m LA Vol (A2C): 32.1 ml 16.84 ml/m LA Vol (A4C): 41.9 ml 21.98 ml/m  AORTIC VALVE AV Area (Vmax):    3.12 cm AV Area (Vmean):   2.88 cm AV Area (VTI):     3.00 cm AV Vmax:           101.00 cm/s AV Vmean:          70.600 cm/s AV VTI:            0.138 m AV Peak Grad:      4.1 mmHg AV Mean Grad:      2.0 mmHg LVOT Vmax:         82.90 cm/s LVOT Vmean:        53.400 cm/s LVOT VTI:          0.109 m LVOT/AV VTI ratio: 0.79  AORTA Ao Root diam: 3.40 cm Ao Asc diam:  3.50 cm MITRAL VALVE MV Area (PHT): 10.92 cm      SHUNTS MV Area VTI:   0.51 cm       Systemic VTI:  0.11 m MV VTI:        0.82 m         Systemic Diam: 2.20 cm MV Decel Time: 70 msec MR Peak grad:    64.0 mmHg MR Vmax:         400.00 cm/s MR PISA Nyquist: 0.3 m/s MR PISA:         3.08 cm MR PISA Radius:  0.70 cm MV E velocity: 93.40 cm/s MV A velocity: 912.00 cm/s MV E/A ratio:  0.10 Olga Millers MD Electronically signed by Olga Millers MD Signature Date/Time: 04/21/2023/1:26:34 PM    Final  MR BRAIN W WO CONTRAST  Result Date: 04/21/2023 CLINICAL  DATA:  Acute neurologic deficit EXAM: MRI HEAD WITHOUT AND WITH CONTRAST MRA HEAD WITHOUT CONTRAST MRA NECK WITHOUT CONTRAST TECHNIQUE: Multiplanar, multiecho pulse sequences of the brain and surrounding structures were obtained without and with intravenous contrast. Angiographic images of the Circle of Willis were obtained using MRA technique without intravenous contrast. Angiographic images of the neck were obtained using MRA technique without intravenous contrast. Carotid stenosis measurements (when applicable) are obtained utilizing NASCET criteria, using the distal internal carotid diameter as the denominator. CONTRAST:  7.43mL GADAVIST GADOBUTROL 1 MMOL/ML IV SOLN COMPARISON:  None Available. FINDINGS: MRI HEAD FINDINGS Brain: Acute infarct of the left PCA territory involving the left occipital and medial temporal lobes. Multiple punctate foci of acute ischemia within the right frontal lobe and right occipital lobe. Old left frontal and right parietal infarcts. There is multifocal hyperintense T2-weighted signal within the white matter. Generalized volume loss. The midline structures are normal. There is no abnormal contrast enhancement. Vascular: Major flow voids are preserved. Skull and upper cervical spine: Normal calvarium and skull base. Visualized upper cervical spine and soft tissues are normal. Sinuses/Orbits:Opacified sphenoid sinuses.  Normal orbits. MRA HEAD FINDINGS POSTERIOR CIRCULATION: --Vertebral arteries: Normal --Inferior cerebellar arteries: Normal. --Basilar artery: Normal. --Superior cerebellar arteries: Normal. --Posterior cerebral arteries: Moderate stenosis of the distal P2 segment of the right PCA. Occlusion of the left PCA P4 segment. ANTERIOR CIRCULATION: --Intracranial internal carotid arteries: Normal. --Anterior cerebral arteries (ACA): Normal. --Middle cerebral arteries (MCA): Normal. MRA NECK FINDINGS Motion degraded time-of-flight imaging of the carotid and vertebral systems.  Postcontrast imaging could not be obtained due to software failure. The visible portions of the carotid and vertebral arteries are normal. The proximal vessels are not visualized. IMPRESSION: 1. Acute infarct of the left PCA territory involving the left occipital and medial temporal lobes. No hemorrhage or mass effect. 2. Multiple punctate foci of acute ischemia within the right frontal lobe and right occipital lobe. 3. Occlusion of the left PCA P4 segment. Moderate stenosis of the distal P2 segment of the right PCA. 4. No visible occlusion in the neck. Electronically Signed   By: Deatra Robinson M.D.   On: 04/21/2023 01:33   MR ANGIO HEAD WO CONTRAST  Result Date: 04/21/2023 CLINICAL DATA:  Acute neurologic deficit EXAM: MRI HEAD WITHOUT AND WITH CONTRAST MRA HEAD WITHOUT CONTRAST MRA NECK WITHOUT CONTRAST TECHNIQUE: Multiplanar, multiecho pulse sequences of the brain and surrounding structures were obtained without and with intravenous contrast. Angiographic images of the Circle of Willis were obtained using MRA technique without intravenous contrast. Angiographic images of the neck were obtained using MRA technique without intravenous contrast. Carotid stenosis measurements (when applicable) are obtained utilizing NASCET criteria, using the distal internal carotid diameter as the denominator. CONTRAST:  7.15mL GADAVIST GADOBUTROL 1 MMOL/ML IV SOLN COMPARISON:  None Available. FINDINGS: MRI HEAD FINDINGS Brain: Acute infarct of the left PCA territory involving the left occipital and medial temporal lobes. Multiple punctate foci of acute ischemia within the right frontal lobe and right occipital lobe. Old left frontal and right parietal infarcts. There is multifocal hyperintense T2-weighted signal within the white matter. Generalized volume loss. The midline structures are normal. There is no abnormal contrast enhancement. Vascular: Major flow voids are preserved. Skull and upper cervical spine: Normal calvarium  and skull base. Visualized upper cervical spine and soft tissues are normal. Sinuses/Orbits:Opacified sphenoid sinuses.  Normal orbits. MRA HEAD FINDINGS POSTERIOR CIRCULATION: --Vertebral arteries: Normal --Inferior cerebellar  arteries: Normal. --Basilar artery: Normal. --Superior cerebellar arteries: Normal. --Posterior cerebral arteries: Moderate stenosis of the distal P2 segment of the right PCA. Occlusion of the left PCA P4 segment. ANTERIOR CIRCULATION: --Intracranial internal carotid arteries: Normal. --Anterior cerebral arteries (ACA): Normal. --Middle cerebral arteries (MCA): Normal. MRA NECK FINDINGS Motion degraded time-of-flight imaging of the carotid and vertebral systems. Postcontrast imaging could not be obtained due to software failure. The visible portions of the carotid and vertebral arteries are normal. The proximal vessels are not visualized. IMPRESSION: 1. Acute infarct of the left PCA territory involving the left occipital and medial temporal lobes. No hemorrhage or mass effect. 2. Multiple punctate foci of acute ischemia within the right frontal lobe and right occipital lobe. 3. Occlusion of the left PCA P4 segment. Moderate stenosis of the distal P2 segment of the right PCA. 4. No visible occlusion in the neck. Electronically Signed   By: Deatra Robinson M.D.   On: 04/21/2023 01:33   MR ANGIO NECK W WO CONTRAST  Result Date: 04/21/2023 CLINICAL DATA:  Acute neurologic deficit EXAM: MRI HEAD WITHOUT AND WITH CONTRAST MRA HEAD WITHOUT CONTRAST MRA NECK WITHOUT CONTRAST TECHNIQUE: Multiplanar, multiecho pulse sequences of the brain and surrounding structures were obtained without and with intravenous contrast. Angiographic images of the Circle of Willis were obtained using MRA technique without intravenous contrast. Angiographic images of the neck were obtained using MRA technique without intravenous contrast. Carotid stenosis measurements (when applicable) are obtained utilizing NASCET  criteria, using the distal internal carotid diameter as the denominator. CONTRAST:  7.67mL GADAVIST GADOBUTROL 1 MMOL/ML IV SOLN COMPARISON:  None Available. FINDINGS: MRI HEAD FINDINGS Brain: Acute infarct of the left PCA territory involving the left occipital and medial temporal lobes. Multiple punctate foci of acute ischemia within the right frontal lobe and right occipital lobe. Old left frontal and right parietal infarcts. There is multifocal hyperintense T2-weighted signal within the white matter. Generalized volume loss. The midline structures are normal. There is no abnormal contrast enhancement. Vascular: Major flow voids are preserved. Skull and upper cervical spine: Normal calvarium and skull base. Visualized upper cervical spine and soft tissues are normal. Sinuses/Orbits:Opacified sphenoid sinuses.  Normal orbits. MRA HEAD FINDINGS POSTERIOR CIRCULATION: --Vertebral arteries: Normal --Inferior cerebellar arteries: Normal. --Basilar artery: Normal. --Superior cerebellar arteries: Normal. --Posterior cerebral arteries: Moderate stenosis of the distal P2 segment of the right PCA. Occlusion of the left PCA P4 segment. ANTERIOR CIRCULATION: --Intracranial internal carotid arteries: Normal. --Anterior cerebral arteries (ACA): Normal. --Middle cerebral arteries (MCA): Normal. MRA NECK FINDINGS Motion degraded time-of-flight imaging of the carotid and vertebral systems. Postcontrast imaging could not be obtained due to software failure. The visible portions of the carotid and vertebral arteries are normal. The proximal vessels are not visualized. IMPRESSION: 1. Acute infarct of the left PCA territory involving the left occipital and medial temporal lobes. No hemorrhage or mass effect. 2. Multiple punctate foci of acute ischemia within the right frontal lobe and right occipital lobe. 3. Occlusion of the left PCA P4 segment. Moderate stenosis of the distal P2 segment of the right PCA. 4. No visible occlusion in  the neck. Electronically Signed   By: Deatra Robinson M.D.   On: 04/21/2023 01:33   US RENAL  Result Date: 04/20/2023 CLINICAL DATA:  Acute kidney injury. EXAM: RENAL / URINARY TRACT ULTRASOUND COMPLETE COMPARISON:  PET-CT 01/08/2022 FINDINGS: Right Kidney: Renal measurements: 10.6 x 4.7 x 4.1 cm = volume: 106 mL. Echogenicity within normal limits. No mass or hydronephrosis visualized. Left  Kidney: Renal measurements: 9.4 x 4.7 x 5.0 = volume: 116 mL. Echogenicity within normal limits. No mass or hydronephrosis visualized. Bladder: Urinary bladder was empty and not well visualized. Other: None. IMPRESSION: Normal sonographic evaluation of the kidneys. Urinary bladder was empty and not well visualized. Electronically Signed   By: Sherron Ales M.D.   On: 04/20/2023 19:27   DG Chest Port 1 View  Result Date: 04/20/2023 CLINICAL DATA:  Altered mental status EXAM: PORTABLE CHEST 1 VIEW COMPARISON:  Chest x-ray January 18 2022. Chest CT November 25, 2022 and Apr 18, 2023 FINDINGS: A left pleural effusion is identified. Opacity is identified in the lingula/lower lobe better appreciated on the Apr 18, 2023 CT and probably similar in the interval. This finding is increased compared to the comparison chest x-ray. The right lung remains clear. The cardiomediastinal silhouette is stable. No pneumothorax. IMPRESSION: 1. Opacity in the lingula/lower lobe has increased compared to the prior chest x-ray. This finding is better appreciated on the Apr 18, 2023 CT and probably similar in the interval. 2. A left pleural effusion is identified. Electronically Signed   By: Gerome Sam III M.D.   On: 04/20/2023 17:49   CT Head Wo Contrast  Result Date: 04/20/2023 CLINICAL DATA:  Mental status change. EXAM: CT HEAD WITHOUT CONTRAST TECHNIQUE: Contiguous axial images were obtained from the base of the skull through the vertex without intravenous contrast. RADIATION DOSE REDUCTION: This exam was performed according to the  departmental dose-optimization program which includes automated exposure control, adjustment of the mA and/or kV according to patient size and/or use of iterative reconstruction technique. COMPARISON:  Brain MRI 01/22/2022 FINDINGS: Brain: Within the medial LEFT occipital lobe there is a geographic region of low cortical density along the posterior falx suggesting a LEFT posterior cerebral arterial infarction. Infarcted region measures 5.7 by 2.5 by 2.5 cm (volume = 19 cm^3) Regional gliosis and encephalomalacia in the LEFT frontal lobe is unchanged. No intracranial hemorrhage.  No midline shift or mass effect. Vascular: No hyperdense vessel or unexpected calcification. Skull: Normal. Negative for fracture or focal lesion. Sinuses/Orbits: Extensive thickening of the maxillary sinus walls. There is evidence of prior ethmoid surgery. Mucosal thickening and fluid levels within the paranasal sinuses. Mucosal thickening extends in the frontal sinus Other: None IMPRESSION: 1. Acute or subacute infarction in the LEFT posterior cerebral artery territory (medial LEFT occipital lobe). 2. No intracranial hemorrhage. 3. Chronic LEFT frontal lobe infarction. 4. Acute on chronic sinusitis. Electronically Signed   By: Genevive Bi M.D.   On: 04/20/2023 16:00    Assessment/Plan: Diagnosis: 72 yo male with NSC lung ca admitted for STEMI and Left PCA CVA, smaller right brain infarcts, likely embolic in origin.    Does the need for close, 24 hr/day medical supervision in concert with the patient's rehab needs make it unreasonable for this patient to be served in a less intensive setting? Yes Co-Morbidities requiring supervision/potential complications:  -cardiomyopathy EF 25-30% -HTN Due to bladder management, bowel management, safety, skin/wound care, disease management, medication administration, pain management, and patient education, does the patient require 24 hr/day rehab nursing? Yes Does the patient require  coordinated care of a physician, rehab nurse, therapy disciplines of PT, OT, ?SLP to address physical and functional deficits in the context of the above medical diagnosis(es)? Yes Addressing deficits in the following areas: balance, endurance, locomotion, strength, transferring, bowel/bladder control, bathing, dressing, feeding, grooming, toileting, cognition, and psychosocial support Can the patient actively participate in an intensive therapy program  of at least 3 hrs of therapy per day at least 5 days per week? Yes The potential for patient to make measurable gains while on inpatient rehab is excellent Anticipated functional outcomes upon discharge from inpatient rehab are supervision  with PT, supervision and min assist with OT, modified independent and supervision with SLP. Estimated rehab length of stay to reach the above functional goals is: 15-20 days Anticipated discharge destination: Home Overall Rehab/Functional Prognosis: excellent  POST ACUTE RECOMMENDATIONS: This patient's condition is appropriate for continued rehabilitative care in the following setting: CIR Patient has agreed to participate in recommended program. Yes Note that insurance prior authorization may be required for reimbursement for recommended care.  Comment: Rehab Admissions Coordinator to follow up. Pt is motivated to return home. Wife is available and can provide some physical assistance.        I have personally performed a face to face diagnostic evaluation of this patient. Additionally, I have examined the patient's medical record including any pertinent labs and radiographic images. If the physician assistant has documented in this note, I have reviewed and edited or otherwise concur with the physician assistant's documentation.  Thanks,  Ranelle Oyster, MD 04/22/2023

## 2023-04-22 NOTE — IPAL (Signed)
  Interdisciplinary Goals of Care Family Meeting   Date carried out: 04/22/2023  Location of the meeting: Bedside  Member's involved: Nurse Practitioner and Family Member or next of kin  Durable Power of Attorney or acting medical decision maker: Wife, Jensen Nevius  Discussion: We discussed goals of care for [employer] .  Mrs. Dingledine asked to come back and discuss with patient's brother Brynda Greathouse about Mr.Clarks changes throughout the day> progressively lethargic, hypotensive, more tachycardic, with progressive bloody stool now with clots, despite earlier interventions.  Given he is unable to tolerate coagulation, with his NSTEMI/ HF, acute CVA, and now complicated by GIB, and given underlying active lung CA and functional decline, he would not be a candidate for intervention.  Family agreed, and brother acknowledge he has uncurable disease and time to focus on comfort.    Orders changed to reflect discussion> full comfort care, updated PMT.  Appreciate assistance   Code status:   Code Status: DNR   Disposition: In-patient comfort care  Time spent for the meeting: 20 mins       Posey Boyer, MSN, AG-ACNP-BC [facility] 04/22/2023, 3:07 PM  See Amion for pager If no response to pager, please call PCCM consult pager After 7:00 pm call Elink

## 2023-04-22 NOTE — TOC Benefit Eligibility Note (Signed)
Patient Product/process development scientist completed.    The patient is currently admitted and upon discharge could be taking Eliquis 5 mg.  The current 30 day co-pay is $47.00.   The patient is currently admitted and upon discharge could be taking Xarelto 20 mg.  The current 30 day co-pay is $47.00.   The patient is currently admitted and upon discharge could be taking Farxiga 10 mg.  The current 30 day co-pay is $47.00.   The patient is currently admitted and upon discharge could be taking Jardiance 10 mg.  The current 30 day co-pay is $47.00.   The patient is insured through [employer] Part D   This test claim was processed through [facility]- copay amounts may vary at other pharmacies due to pharmacy/plan contracts, or as the patient moves through the different stages of their insurance plan.  Roland Earl, CPHT Pharmacy Patient Advocate Specialist [facility] Patient Advocate Team Direct Number: [phone]  Fax: [phone]

## 2023-04-22 NOTE — Progress Notes (Signed)
Physical Therapy Treatment Patient Details Name: Peter Diaz MRN: [id number] DOB: 1951-08-20 Today's Date: 04/22/2023   History of Present Illness The pt is a 72 yo male presenting 5/12 with AMS, weakness, and recent falls. Work up showed STEMI and acute infarct of left PCA territory involving the left occipital and medial temporal lobes. PMH includes: GERD, gout, HTN, and lung cancer on immunotherapy.    PT Comments    Pt pleasant and joking but continues to demonstrate decreased memory, awareness, safety and function. Pt with decreased vision and awareness on left and limited by fatigue. RN and wife present during session with pt needing repetition for cues and education with continued inpatient rehab appropriate. Will continue to follow.  HR 111 SPO2 95% on RA   Recommendations for follow up therapy are one component of a multi-disciplinary discharge planning process, led by the attending physician.  Recommendations may be updated based on patient status, additional functional criteria and insurance authorization.  Follow Up Recommendations       Assistance Recommended at Discharge Frequent or constant Supervision/Assistance  Patient can return home with the following A lot of help with walking and/or transfers;A little help with bathing/dressing/bathroom;Assistance with cooking/housework;Direct supervision/assist for medications management;Direct supervision/assist for financial management;Assist for transportation;Help with stairs or ramp for entrance   Equipment Recommendations  Rolling walker (2 wheels)    Recommendations for Other Services       Precautions / Restrictions Precautions Precautions: Fall Precaution Comments: watch vitals, flexiseal, limited left vision and lack of awareness     Mobility  Bed Mobility Overal bed mobility: Needs Assistance Bed Mobility: Supine to Sit     Supine to sit: Min guard, HOB elevated     General bed mobility comments: HOB 25  degrees with pt able to pivot to left side of bed without physical assist, cues for line management    Transfers Overall transfer level: Needs assistance     Sit to Stand: Min assist           General transfer comment: cues for hand placement, backing to surface and safety. pt stood x 2 from bed and 3 repeated sit to stands from chair with armrests    Ambulation/Gait Ambulation/Gait assistance: Min assist, +2 safety/equipment Gait Distance (Feet): 40 Feet Assistive device: Rolling walker (2 wheels) Gait Pattern/deviations: Step-through pattern, Decreased stride length, Trunk flexed   Gait velocity interpretation: <1.8 ft/sec, indicate of risk for recurrent falls   General Gait Details: pt with tendency to pull RW toward left, cues for direction, scanning environment and safety. Min assist to direct RW with +2 for IV pole/safety. Pt limited by fatigue   Stairs             Wheelchair Mobility    Modified Rankin (Stroke Patients Only)       Balance Overall balance assessment: Needs assistance Sitting-balance support: No upper extremity supported, Feet supported Sitting balance-Leahy Scale: Good     Standing balance support: Bilateral upper extremity supported, During functional activity Standing balance-Leahy Scale: Poor Standing balance comment: Rw in standing                            Cognition Arousal/Alertness: Awake/alert Behavior During Therapy: WFL for tasks assessed/performed Overall Cognitive Status: Impaired/Different from baseline Area of Impairment: Orientation, Attention, Following commands, Safety/judgement, Problem solving, Awareness                 Orientation Level: Disoriented to,  Time Current Attention Level: Focused   Following Commands: Follows one step commands consistently Safety/Judgement: Decreased awareness of deficits, Decreased awareness of safety   Problem Solving: Slow processing General Comments: pt with  left inattention, decreased STM unable to recall day of week despite education and repeating day during session. pt without awareness of deficits and will state he can see things then point in the opposite direction        Exercises      General Comments        Pertinent Vitals/Pain Pain Assessment Pain Assessment: No/denies pain    Home Living                          Prior Function            PT Goals (current goals can now be found in the care plan section) Progress towards PT goals: Progressing toward goals    Frequency    Min 1X/week      PT Plan Current plan remains appropriate    Co-evaluation              AM-PAC PT "6 Clicks" Mobility   Outcome Measure  Help needed turning from your back to your side while in a flat bed without using bedrails?: A Little Help needed moving from lying on your back to sitting on the side of a flat bed without using bedrails?: A Little Help needed moving to and from a bed to a chair (including a wheelchair)?: A Little Help needed standing up from a chair using your arms (e.g., wheelchair or bedside chair)?: A Little Help needed to walk in hospital room?: A Lot Help needed climbing 3-5 steps with a railing? : Total 6 Click Score: 15    End of Session Equipment Utilized During Treatment: Gait belt Activity Tolerance: Patient tolerated treatment well;Patient limited by fatigue Patient left: in chair;with call bell/phone within reach;with chair alarm set;with family/visitor present Nurse Communication: Mobility status PT Visit Diagnosis: Other abnormalities of gait and mobility (R26.89);Difficulty in walking, not elsewhere classified (R26.2)     Time: 1610-9604 PT Time Calculation (min) (ACUTE ONLY): 23 min  Charges:  $Gait Training: 8-22 mins $Therapeutic Activity: 8-22 mins                     Merryl Hacker, PT Acute Rehabilitation Services Office: [phone]    Enedina Finner Demarus Latterell 04/22/2023, 11:03  AM

## 2023-04-22 NOTE — Progress Notes (Signed)
Speech Language Pathology Treatment: Dysphagia;Cognitive-Linquistic  Patient Details Name: Peter Diaz MRN: [id number] DOB: [birth year] Today's Date: 04/22/2023 Time: 1030-1106 SLP Time Calculation (min) (ACUTE ONLY): 36 min  Assessment / Plan / Recommendation Clinical Impression  SWALLOWING Pt and wife report no difficulties with PO.  Today pt tolerated thin liquid, puree, and regular solid with no clinical s/s of aspiration.  Pt exhibited good oral clearance of solids. Pt did not respond to questions regarding diet preference.  Wife is in agreement for advancing diet.  Pt was eager for sips of water, but did not want much to eat.  Pt may not meet nutritional needs orally 2/2 lack of appetite.  Recommend regular texture solids with thin liquids  COGNITIVE LINGUISTIC Pt oriented to hospital only.  Not oriented to time or location (city).  Pt did not benefit from cues to use external aids to assist with orientation.  Pt responded "Wednesday" to most orientation questions for both time and place.  Pt completed calculations with 50% accuracy, but was only 1 off on one additional item.  Pt did not benefit from breaking problems into steps as he could not maintain attention to execute multiple steps in problem.  Pt had difficulty remembering immediate question asked of him, for example, by multiplying instead of dividing.  Attempted to complete abstraction task with pt.  It became clear that he was having difficulty with language in addition to attention.  Pt was answering question as if it were about a homophone of target and could not be redirected to correct definition of word.  With confrontational naming task pt correctly responded to 60% of items.  Semantic paraphasia noted x1 Financial trader" for "telephone").  Pt benefited from visual and tactile cuing on another item.  Pt also had unrelated, tangential responses at time (e.g. "dollar bill" for "watch").  It is difficult to discern how much of a  role inattention and aphasia are playing in current deficits.  At present, continue ST in house and at next level of care.    HPI HPI: The pt is a 72 yo male presenting 5/12 with AMS, weakness, and recent falls. Work up showed STEMI and acute infarct of left PCA territory involving the left occipital and medial temporal lobes. PMH includes: GERD, gout, HTN, and lung cancer on immunotherapy.      SLP Plan  Continue with current plan of care      Recommendations for follow up therapy are one component of a multi-disciplinary discharge planning process, led by the attending physician.  Recommendations may be updated based on patient status, additional functional criteria and insurance authorization.    Recommendations  Diet recommendations: Regular;Thin liquid Liquids provided via: Cup;Straw Medication Administration: Whole meds with puree Supervision: Staff to assist with self feeding;Intermittent supervision to cue for compensatory strategies Compensations: Slow rate;Small sips/bites;Minimize environmental distractions Postural Changes and/or Swallow Maneuvers: Seated upright 90 degrees                  Oral care BID;Staff/trained caregiver to provide oral care     Cognitive communication deficit (R41.841);Attention and concentration deficit;Aphasia (R47.01) Cerebral infarction   Continue with current plan of care     Kerrie Pleasure, MA, CCC-SLP Acute Rehabilitation Services Office: [phone] 04/22/2023, 11:16 AM

## 2023-04-22 NOTE — Progress Notes (Signed)
STROKE TEAM PROGRESS NOTE   INTERVAL HISTORY RN is at the bedside. Pt lying in chair, initially sleeping but easily arousable with voice. Orientated to place but not to time or age, perseveration no numbers. Still with some confusion and perseveration with visual field testing, not quite cooperative. However, seems continued right hemianopia. Moving all extremities.    Vitals:   04/22/23 0800 04/22/23 0900 04/22/23 0915 04/22/23 1200  BP: (!) 105/56 102/64    Pulse: (!) 107 (!) 106    Resp: (!) 27 (!) 25    Temp:   97.8 F (36.6 C) 98 F (36.7 C)  TempSrc:   Oral Oral  SpO2: 99% 100%    Weight:      Height:       CBC:  Recent Labs  Lab 04/20/23 1522 04/21/23 0137 04/21/23 1955 04/22/23 0210 04/22/23 0613  WBC 17.4* 14.5*  --  15.3*  --   NEUTROABS 15.2*  --   --   --   --   HGB 11.3* 11.8*   < > 10.0* 10.5*  HCT 37.2* 38.3*   < > 32.2* 34.6*  MCV 86.9 84.7  --  85.0  --   PLT 252 279  --  320  --    < > = values in this interval not displayed.   Basic Metabolic Panel:  Recent Labs  Lab 04/20/23 1522 04/21/23 0137 04/22/23 0210  NA 136 136 135  K 4.6 4.1 3.7  CL 103 101 103  CO2 19* 21* 18*  GLUCOSE 102* 77 90  BUN 39* 40* 51*  CREATININE 3.06* 2.61* 2.19*  CALCIUM 7.6* 7.9* 7.5*  MG 1.9 2.1  --   PHOS  --  4.4  --    Lipid Panel:  Recent Labs  Lab 04/20/23 1522  CHOL 100  TRIG 69  HDL 32*  CHOLHDL 3.1  VLDL 14  LDLCALC 54   HgbA1c:  Recent Labs  Lab 04/20/23 1522  HGBA1C 5.2   Urine Drug Screen: No results for input(s): "LABOPIA", "COCAINSCRNUR", "LABBENZ", "AMPHETMU", "THCU", "LABBARB" in the last 168 hours.  Alcohol Level No results for input(s): "ETH" in the last 168 hours.  IMAGING past 24 hours CT ABDOMEN PELVIS WO CONTRAST  Result Date: 04/21/2023 CLINICAL DATA:  Non-small cell lung cancer staging. EXAM: CT ABDOMEN AND PELVIS WITHOUT CONTRAST TECHNIQUE: Multidetector CT imaging of the abdomen and pelvis was performed following the  standard protocol without IV contrast. RADIATION DOSE REDUCTION: This exam was performed according to the departmental dose-optimization program which includes automated exposure control, adjustment of the mA and/or kV according to patient size and/or use of iterative reconstruction technique. COMPARISON:  PET-CT 01/08/2022 FINDINGS: Lower chest: Small right and moderate left pleural effusions are partially visualized. Hepatobiliary: Gallstones are present. There is no biliary ductal dilatation. The liver is within normal limits. Pancreas: There is a 2.6 x 1.7 cm low-attenuation area abutting and anterior to the pancreatic head image 3/37, likely cystic. Otherwise, the pancreas is within normal limits. Spleen: Normal in size without focal abnormality. Adrenals/Urinary Tract: Adrenal glands are unremarkable. Kidneys are normal, without renal calculi, focal lesion, or hydronephrosis. Bladder is unremarkable. Stomach/Bowel: Stomach is within normal limits. Appendix appears normal. No evidence of bowel wall thickening, distention, or inflammatory changes. Vascular/Lymphatic: Aortic atherosclerosis. No enlarged abdominal or pelvic lymph nodes. Reproductive: Prostate gland is enlarged. Other: There is a small fat containing right inguinal hernia. There is no ascites. Musculoskeletal: No acute or significant osseous findings. IMPRESSION: 1.  No evidence for metastatic disease in the abdomen or pelvis. 2. Moderate left and small right pleural effusions. 3. Cholelithiasis. 4. 2.6 cm low-attenuation area abutting and anterior to the pancreatic head, likely cystic. Recommend further evaluation with MRI. Aortic Atherosclerosis (ICD10-I70.0). Electronically Signed   By: Darliss Cheney M.D.   On: 04/21/2023 21:26    PHYSICAL EXAM General: elderly male, laying in bed, NAD. Head: Vincent/AT CV: normal rate and regular rhythm, no m/r/g. Pulm: normal WOB on RA. Skin: warm and dry. Psych: calm but not fully cooperative with  exam  Neuro: Remains confused on exam, orientated to place but not time or age. Able to speak legibly, no dysarthria. Perseverates on questioning. Appears to have R hemineglect. Able to follow one-step commands but difficulty with multi-step commands. Appears to have R hemianopsia, but does not cooperate well with visual field testing. EOMI, able to have gaze bilaterally but with left gaze preference. No obvious facial droop. Moving extremities bilaterally throughout. Sensation difficult to assess as he is uncooperative. Appears intact in upper extremities. Has difficulty with sensation in bilateral lower extremities to light touch (appears to be guessing when blinded). Does not cooperate well with coordination exam but grossly intact. Gait deferred for patient's safety.   ASSESSMENT/PLAN [name] is a 72 y.o. male with history of stage IIIb non-small cell lung cancer, prior left frontal lobe intraparenchymal abscess s/p evacuation 22 years ago, HTN presenting with STEMI and multiple acute infarcts.   Stroke - Acute infarct of L PCA and multiple bilateral punctate infarcts, etiology:  likely 2/2 hypercoagulable state from advanced NSCLC vs. cardioembolic from STEMI and cardiomyopathy with low EF Code Stroke CT head with acute infarction in L posterior cerebral artery territory MRI  - acute infarct of L PCA territory involving L occipital and medial temporal lobes. Multiple punctate foci of acute ischemia within R frontal lobe and R occipital lobe.  MRA head and neck - occlusion of left PCA P4 segment. Moderate stenosis of distal P2 segment of R PCA.  2D Echo - LVEF 25-30%, akinesis of anteroseptal wall and apex with overall severe LV dysfunction. No LV thrombus. Normal LA size and no interatrial shunt detected. LE venous doppler - chronic right femoral vein DVT.  LDL 54 HgbA1c 5.2 VTE prophylaxis - IV heparin (for STEMI) No antithrombotic prior to admission, now on hold due to possible  GIB. Once stable, will recommend eliquis and plavix per oncology and cardiology. Therapy recommendations:  pending Disposition:  pending  STEMI Cardiomyopathy  acute anteroseptal infarct, unclear duration CHO with akinesis of anteroseptal wall and apex with overall severe LV dysfunction, EF 25-30% now antithrombotics on hold due to possible GIB. Once stable, will recommend eliquis and plavix. cardiology following, appreciate management  NSCLC Stage IIIB large LUL mass S/p chemo, Rx, and steroids Following with Dr. Shirline Frees at [facility] CT chest 5/10 no evidence of recurrent or metastatic disease.  Discussed with Dr. Myna Hidalgo  CT abd/pelvis no evidence of metastasis LE venous doppler - chronic right femoral vein DVT.  Consider eliquis per oncology  Hypertension Home meds:  norvasc 10mg  daily Stable Long-term BP goal normotensive  Hyperlipidemia Home meds:  none LDL 54, goal < 70  High intensity statin not indicated given LDL at goal  Other Stroke Risk Factors Advanced Age >/= 64  Coronary artery disease  Other Active Problems Leukocytosis WBC 17.4 AKI Cre 3.06 ? GIB - Hb 11.8->10, dark stool, holding antithrombotics   Hospital day # 2  This patient  is critically ill due to embolic stroke, STEMI, cardiomyopathy with low EF, lung cancer and at significant risk of neurological worsening, death form recurrent stroke, hemorrhagic transformation, heart failure. This patient's care requires constant monitoring of vital signs, hemodynamics, respiratory and cardiac monitoring, review of multiple databases, neurological assessment, discussion with family, other specialists and medical decision making of high complexity. I spent 30 minutes of neurocritical care time in the care of this patient.    Marvel Plan, MD PhD Stroke Neurology 04/22/2023 2:47 PM    To contact Stroke Continuity provider, please refer to [email or web address]. After hours, contact General Neurology

## 2023-04-22 NOTE — Plan of Care (Signed)

## 2023-04-22 NOTE — Telephone Encounter (Signed)
Pharmacy Patient Advocate Encounter  Insurance verification completed.    The patient is insured through [employer] Part D   The patient is currently admitted and ran test claims for the following: Eliquis, Xarelto, Farxiga, Jardiance.  Copays and coinsurance results were relayed to Inpatient clinical team.

## 2023-04-22 NOTE — Progress Notes (Signed)
ANTICOAGULATION CONSULT NOTE - Initial Consult  Pharmacy Consult for Heparin Indication: chest pain/ACS  Allergies  Allergen Reactions   Marinol [Dronabinol] Other (See Comments)    "Feels bloated. I don' t ever want to take it "    Patient Measurements: Height: 5' 7.99" (172.7 cm) Weight: 77.5 kg (170 lb 13.7 oz) IBW/kg (Calculated) : 68.38 Heparin Dosing Weight: 77 kg  Vital Signs: Temp: 97.6 F (36.4 C) (05/14 0323) Temp Source: Axillary (05/14 0323) BP: 109/68 (05/14 0600) Pulse Rate: 104 (05/14 0600)  Labs: Recent Labs    04/20/23 1522 04/21/23 0137 04/21/23 1557 04/21/23 1955 04/22/23 0210 04/22/23 0613  HGB 11.3* 11.8*  --  11.7* 10.0*  --   HCT 37.2* 38.3*  --  37.4* 32.2*  --   PLT 252 279  --   --  320  --   APTT 31  --   --   --   --   --   LABPROT 16.5*  --   --   --   --   --   INR 1.3*  --   --   --   --   --   HEPARINUNFRC  --   --  <0.10*  --   --  <0.10*  CREATININE 3.06* 2.61*  --   --  2.19*  --   TROPONINIHS >24,000*  --   --   --   --   --      Estimated Creatinine Clearance: 29.5 mL/min (A) (by C-G formula based on SCr of 2.19 mg/dL (H)).   Medical History: Past Medical History:  Diagnosis Date   GERD (gastroesophageal reflux disease)    Gout    History of radiation therapy    Left lung- (01/28/22-03/08/22)- Dr. Antony Blackbird   HTN (hypertension)     Assessment: Peter Diaz is a 72 yo M PMH significant for NSCLC presenting with AMS. On presentation, EKG showing STEMI, MRI showing acute PCA infarct. Cath deferred in the setting of active stroke. Pharmacy has been consulted for heparin x48h for medical management of ACS. Plan for low goal with no bolus due to acute stroke.   Heparin level undetectable this AM after dose increase to 1000 units/hr. Hgb 10, down slightly, plts wnl. No s/s of bleeding noted.   Goal of Therapy:  Heparin level 0.3-0.5 Monitor platelets by anticoagulation protocol: Yes   Plan:  Increase heparin to 1150  units/hr Collect heparin level in 8h Monitor daily heparin level, CBC, s/s of bleeding  Rennis Petty, PharmD PGY1 Pharmacy Resident 04/22/2023 6:47 AM

## 2023-04-22 NOTE — Progress Notes (Signed)
Palliative Medicine Progress Note   Patient Name: Peter Diaz       Date: 04/22/2023 DOB: 11-Nov-1951  Age: 72 y.o. MRN#: [id number] Attending Physician: Lynnell Catalan, MD Primary Care Physician: [name], MD Admit Date: 04/20/2023  Reason for Consultation/Follow-up: {Reason for Consult:23484}  HPI/Patient Profile: 72 y.o. male  with past medical history of stage IIIb non-small cell lung cancer, HTN, gout, and GERD who presented to the ED on 04/20/2023 with altered mental status.  EKG was concerning for ST elevation, code STEMI was activated, and patient was taken to the cath lab.  However head CT was negative for ICH but revealed acute stroke and cardiac cath was canceled. He has multiple acute issues including acute metabolic encephalopathy, acute left PCA infarct, STEMI, AKI, acute hypoxic respiratory failure, and possible postobstructive pneumonia.    Lung cancer was initially diagnosed in February 2023. He is status post chemotherapy and radiation with partial response. He started immunotherapy in May 2023, and is status post 12 of 13 cycles (last dose given 02/19/23). He was tolerating well until he developed a significant rash in April, and it was decided to not proceed with the last cycle of immunotherapy. Plan was for CT chest in May for restaging of his disease.    Palliative Medicine has been consulted for goals of care.  Subjective: Patient is OOB to the recliner. He is pleasant and with no acute complaints. He is oriented to person and that he is in the hospital.   Family Meeting:  Objective:  Physical Exam          Vital Signs: BP 102/64   Pulse (!) 106   Temp 98 F (36.7 C) (Oral)   Resp (!) 25   Ht 5' 7.99" (1.727 m)   Wt 77.5 kg   SpO2 100%   BMI 25.98 kg/m   SpO2: SpO2: 100 %   Palliative Medicine Assessment & Plan   Assessment: Principal Problem:   STEMI (ST elevation myocardial infarction) (HCC) Active Problems:   Cerebrovascular accident (CVA) due to occlusion of left posterior cerebral artery (HCC)   Acute respiratory failure with hypoxia (HCC)   Encephalopathy acute    Recommendations/Plan: Code status to DNR/DNI Continue current supportive interventions for now  Goals of Care and Additional Recommendations: Limitations on Scope of Treatment: {Recommended Scope and  Preferences:21019}  Code Status:   Prognosis:  {Palliative Care Prognosis:23504}  Discharge Planning: {Palliative dispostion:23505}  Care plan was discussed with ***  Thank you for allowing the Palliative Medicine Team to assist in the care of this patient.   ***   Merry Proud, NP   Please contact Palliative Medicine Team phone at [phone] for questions and concerns.  For individual providers, please see AMION.

## 2023-04-22 NOTE — Progress Notes (Signed)
Inpatient Rehab Admissions Coordinator:   Met with pt and family at bedside (pt's spouse, his brother, and his sister in law).  Briefly explained CIR goals/expectations.  RN in and reports possible change in goals, and ongoing discussions with palliative medicine.  CIR will step back.  If needed, please feel free to contact me.   Estill Dooms, PT, DPT Admissions Coordinator [phone] 04/22/23  2:34 PM

## 2023-04-22 NOTE — Progress Notes (Signed)
Rounding Note    Patient Name: Peter Diaz Date of Encounter: 04/22/2023  [facility] Cardiologist: None new  Subjective   Patient denies any complaints. Denies any chest pain or SOB  Inpatient Medications    Scheduled Meds:  Chlorhexidine Gluconate Cloth  6 each Topical Daily   clopidogrel  75 mg Oral Daily   dapagliflozin propanediol  10 mg Oral Daily   mouth rinse  15 mL Mouth Rinse 4 times per day   potassium chloride  40 mEq Oral BID   rosuvastatin  20 mg Oral Daily   spironolactone  25 mg Oral Daily   Continuous Infusions:  cefTRIAXone (ROCEPHIN)  IV Stopped (04/21/23 1341)   heparin 1,150 Units/hr (04/22/23 0704)   PRN Meds: acetaminophen **OR** acetaminophen (TYLENOL) oral liquid 160 mg/5 mL **OR** acetaminophen, docusate sodium, mouth rinse, mouth rinse, polyethylene glycol, traZODone   Vital Signs    Vitals:   04/22/23 0500 04/22/23 0600 04/22/23 0701 04/22/23 0800  BP: (!) 89/58 109/68 (!) 92/53 (!) 105/56  Pulse: 96 (!) 104 99 (!) 107  Resp:  (!) 24 20 (!) 27  Temp:      TempSrc:      SpO2: 95% 97% 100% 99%  Weight: 77.5 kg     Height:        Intake/Output Summary (Last 24 hours) at 04/22/2023 0831 Last data filed at 04/22/2023 0700 Gross per 24 hour  Intake 1466.01 ml  Output 1630 ml  Net -163.99 ml       04/22/2023    5:00 AM 04/21/2023    8:00 AM 04/21/2023    5:00 AM  Last 3 Weights  Weight (lbs) 170 lb 13.7 oz 169 lb 12.1 oz 169 lb 12.1 oz  Weight (kg) 77.5 kg 77 kg 77 kg      Telemetry    NSR, sinus tachy, PVCs - Personally Reviewed  ECG    NSR with acute anteroseptal infarct.  - Personally Reviewed  Physical Exam   GEN: No acute distress.   Neck: No JVD Cardiac: RRR, no murmurs, rubs, or gallops.  Respiratory: Clear to auscultation bilaterally. GI: Soft, nontender, non-distended  MS: No edema; No deformity. Neuro:  oriented to self and place.  Psych: Normal affect   Labs    High Sensitivity Troponin:    Recent Labs  Lab 04/20/23 1522  TROPONINIHS >24,000*      Chemistry Recent Labs  Lab 04/20/23 1522 04/21/23 0137 04/22/23 0210  NA 136 136 135  K 4.6 4.1 3.7  CL 103 101 103  CO2 19* 21* 18*  GLUCOSE 102* 77 90  BUN 39* 40* 51*  CREATININE 3.06* 2.61* 2.19*  CALCIUM 7.6* 7.9* 7.5*  MG 1.9 2.1  --   PROT 5.8*  --   --   ALBUMIN 2.1*  --   --   AST 75*  --   --   ALT 43  --   --   ALKPHOS 76  --   --   BILITOT 1.2  --   --   GFRNONAA 21* 25* 31*  ANIONGAP 14 14 14      Lipids  Recent Labs  Lab 04/20/23 1522  CHOL 100  TRIG 69  HDL 32*  LDLCALC 54  CHOLHDL 3.1     Hematology Recent Labs  Lab 04/20/23 1522 04/21/23 0137 04/21/23 1955 04/22/23 0210  WBC 17.4* 14.5*  --  15.3*  RBC 4.28 4.52  --  3.79*  HGB 11.3* 11.8*  11.7* 10.0*  HCT 37.2* 38.3* 37.4* 32.2*  MCV 86.9 84.7  --  85.0  MCH 26.4 26.1  --  26.4  MCHC 30.4 30.8  --  31.1  RDW 15.5 15.6*  --  15.4  PLT 252 279  --  320    Thyroid No results for input(s): "TSH", "FREET4" in the last 168 hours.  BNP Recent Labs  Lab 04/22/23 0210  BNP 1,985.7*    DDimer No results for input(s): "DDIMER" in the last 168 hours.   Radiology    CT ABDOMEN PELVIS WO CONTRAST  Result Date: 04/21/2023 CLINICAL DATA:  Non-small cell lung cancer staging. EXAM: CT ABDOMEN AND PELVIS WITHOUT CONTRAST TECHNIQUE: Multidetector CT imaging of the abdomen and pelvis was performed following the standard protocol without IV contrast. RADIATION DOSE REDUCTION: This exam was performed according to the departmental dose-optimization program which includes automated exposure control, adjustment of the mA and/or kV according to patient size and/or use of iterative reconstruction technique. COMPARISON:  PET-CT 01/08/2022 FINDINGS: Lower chest: Small right and moderate left pleural effusions are partially visualized. Hepatobiliary: Gallstones are present. There is no biliary ductal dilatation. The liver is within normal limits.  Pancreas: There is a 2.6 x 1.7 cm low-attenuation area abutting and anterior to the pancreatic head image 3/37, likely cystic. Otherwise, the pancreas is within normal limits. Spleen: Normal in size without focal abnormality. Adrenals/Urinary Tract: Adrenal glands are unremarkable. Kidneys are normal, without renal calculi, focal lesion, or hydronephrosis. Bladder is unremarkable. Stomach/Bowel: Stomach is within normal limits. Appendix appears normal. No evidence of bowel wall thickening, distention, or inflammatory changes. Vascular/Lymphatic: Aortic atherosclerosis. No enlarged abdominal or pelvic lymph nodes. Reproductive: Prostate gland is enlarged. Other: There is a small fat containing right inguinal hernia. There is no ascites. Musculoskeletal: No acute or significant osseous findings. IMPRESSION: 1. No evidence for metastatic disease in the abdomen or pelvis. 2. Moderate left and small right pleural effusions. 3. Cholelithiasis. 4. 2.6 cm low-attenuation area abutting and anterior to the pancreatic head, likely cystic. Recommend further evaluation with MRI. Aortic Atherosclerosis (ICD10-I70.0). Electronically Signed   By: Darliss Cheney M.D.   On: 04/21/2023 21:26   VAS [place] LOWER EXTREMITY VENOUS (DVT)  Result Date: 04/21/2023  Lower Venous DVT Study Patient Name:  Peter Diaz  Date of Exam:   04/21/2023 Medical Rec #: [id number]       Accession #:    [id number] Date of Birth: July 23, 1951       Patient Gender: M Patient Age:   72 years Exam Location:  [facility] Procedure:      VAS [place] LOWER EXTREMITY VENOUS (DVT) Referring Phys: Levon Hedger --------------------------------------------------------------------------------  Indications: Stroke. Embolic.  Risk Factors: Cancer - lung. Comparison Study: No prior studies. Performing Technologist: Jean Rosenthal RDMS, RVT  Examination Guidelines: A complete evaluation includes B-mode imaging, spectral Doppler, color Doppler, and power Doppler as needed of  all accessible portions of each vessel. Bilateral testing is considered an integral part of a complete examination. Limited examinations for reoccurring indications may be performed as noted. The reflux portion of the exam is performed with the patient in reverse Trendelenburg.  +---------+---------------+---------+-----------+---------------+-------------+ RIGHT    CompressibilityPhasicitySpontaneityProperties     Thrombus  Aging         +---------+---------------+---------+-----------+---------------+-------------+ CFV      Full           Yes      Yes                                     +---------+---------------+---------+-----------+---------------+-------------+ SFJ      Full                                                            +---------+---------------+---------+-----------+---------------+-------------+ FV Prox  Full                                                            +---------+---------------+---------+-----------+---------------+-------------+ FV Mid   Partial        Yes      Yes        brightly       Chronic                                                   echogenic                    +---------+---------------+---------+-----------+---------------+-------------+ FV DistalFull                                                            +---------+---------------+---------+-----------+---------------+-------------+ PFV      Full                                                            +---------+---------------+---------+-----------+---------------+-------------+ POP      Full           Yes      Yes                                     +---------+---------------+---------+-----------+---------------+-------------+ PTV      Full                                                             +---------+---------------+---------+-----------+---------------+-------------+ PERO     Full                                                            +---------+---------------+---------+-----------+---------------+-------------+   +---------+---------------+---------+-----------+----------+--------------+  LEFT     CompressibilityPhasicitySpontaneityPropertiesThrombus Aging +---------+---------------+---------+-----------+----------+--------------+ CFV      Full           Yes      Yes                                 +---------+---------------+---------+-----------+----------+--------------+ SFJ      Full                                                        +---------+---------------+---------+-----------+----------+--------------+ FV Prox  Full                                                        +---------+---------------+---------+-----------+----------+--------------+ FV Mid   Full                                                        +---------+---------------+---------+-----------+----------+--------------+ FV DistalFull                                                        +---------+---------------+---------+-----------+----------+--------------+ PFV      Full                                                        +---------+---------------+---------+-----------+----------+--------------+ POP      Full           Yes      Yes                                 +---------+---------------+---------+-----------+----------+--------------+ PTV      Full                                                        +---------+---------------+---------+-----------+----------+--------------+ PERO     Full                                                        +---------+---------------+---------+-----------+----------+--------------+     Summary: RIGHT: - Findings consistent with chronic deep vein thrombosis involving the right femoral vein.   - No cystic structure found in the popliteal fossa.  LEFT: - There is no evidence of deep vein thrombosis in the lower extremity.  - No  cystic structure found in the popliteal fossa.  *See table(s) above for measurements and observations. Electronically signed by Sherald Hess MD on 04/21/2023 at 3:17:03 PM.    Final    ECHOCARDIOGRAM COMPLETE  Result Date: 04/21/2023    ECHOCARDIOGRAM REPORT   Patient Name:   Peter Diaz Date of Exam: 04/21/2023 Medical Rec #:  [id number]      Height:       68.0 in Accession #:    [id number]     Weight:       169.8 lb Date of Birth:  11/10/1951      BSA:          1.906 m Patient Age:    71 years       BP:           102/66 mmHg Patient Gender: M              HR:           111 bpm. Exam Location:  Inpatient Procedure: 2D Echo, Cardiac Doppler, Color Doppler and Intracardiac            Opacification Agent Indications:    Acute Myocardial Infarction  History:        Patient has no prior history of Echocardiogram examinations.                 Risk Factors:Hypertension and Former Smoker.  Sonographer:    Raeford Razor Sonographer#2:  Irving Burton Senior Referring Phys: [id number] Lorin Glass  Sonographer Comments: Image acquisition challenging due to patient body habitus. IMPRESSIONS  1. Akinesis of the anteroseptal wall and apex with overall severe LV dysfunction.  2. Left ventricular ejection fraction, by estimation, is 25 to 30%. The left ventricle has severely decreased function. The left ventricle demonstrates regional wall motion abnormalities (see scoring diagram/findings for description). Left ventricular diastolic parameters are indeterminate.  3. Right ventricular systolic function is normal. The right ventricular size is normal.  4. A small pericardial effusion is present.  5. The mitral valve is normal in structure. Mild mitral valve regurgitation. No evidence of mitral stenosis.  6. The aortic valve is tricuspid. Aortic valve regurgitation is not visualized. Aortic valve  sclerosis is present, with no evidence of aortic valve stenosis.  7. The inferior vena cava is normal in size with greater than 50% respiratory variability, suggesting right atrial pressure of 3 mmHg. Comparison(s): No prior Echocardiogram. FINDINGS  Left Ventricle: Left ventricular ejection fraction, by estimation, is 25 to 30%. The left ventricle has severely decreased function. The left ventricle demonstrates regional wall motion abnormalities. Definity contrast agent was given IV to delineate the left ventricular endocardial borders. The left ventricular internal cavity size was normal in size. There is no left ventricular hypertrophy. Left ventricular diastolic parameters are indeterminate. Right Ventricle: The right ventricular size is normal. Right ventricular systolic function is normal. Left Atrium: Left atrial size was normal in size. Right Atrium: Right atrial size was normal in size. Pericardium: A small pericardial effusion is present. Mitral Valve: The mitral valve is normal in structure. Mild mitral valve regurgitation. No evidence of mitral valve stenosis. Tricuspid Valve: The tricuspid valve is normal in structure. Tricuspid valve regurgitation is trivial. No evidence of tricuspid stenosis. Aortic Valve: The aortic valve is tricuspid. Aortic valve regurgitation is not visualized. Aortic valve sclerosis is present, with no evidence of aortic valve stenosis. Aortic valve mean gradient measures 2.0 mmHg. Aortic valve peak gradient measures 4.1  mmHg. Aortic valve  area, by VTI measures 3.00 cm. Pulmonic Valve: The pulmonic valve was normal in structure. Pulmonic valve regurgitation is not visualized. No evidence of pulmonic stenosis. Aorta: The aortic root is normal in size and structure. Venous: The inferior vena cava is normal in size with greater than 50% respiratory variability, suggesting right atrial pressure of 3 mmHg. IAS/Shunts: No atrial level shunt detected by color flow Doppler. Additional  Comments: Akinesis of the anteroseptal wall and apex with overall severe LV dysfunction.  LEFT VENTRICLE PLAX 2D LVIDd:         5.45 cm      Diastology LVIDs:         4.80 cm      LV e' medial:    13.40 cm/s LV PW:         0.80 cm      LV E/e' medial:  7.0 LV IVS:        0.95 cm      LV e' lateral:   9.00 cm/s LVOT diam:     2.20 cm      LV E/e' lateral: 10.4 LV SV:         41 LV SV Index:   22 LVOT Area:     3.80 cm  LV Volumes (MOD) LV vol d, MOD A2C: 135.0 ml LV vol d, MOD A4C: 105.0 ml LV vol s, MOD A2C: 97.8 ml LV vol s, MOD A4C: 63.4 ml LV SV MOD A2C:     37.2 ml LV SV MOD A4C:     105.0 ml RIGHT VENTRICLE RV S prime:     7.00 cm/s TAPSE (M-mode): 2.4 cm LEFT ATRIUM           Index LA diam:      3.67 cm 1.93 cm/m LA Vol (A2C): 32.1 ml 16.84 ml/m LA Vol (A4C): 41.9 ml 21.98 ml/m  AORTIC VALVE AV Area (Vmax):    3.12 cm AV Area (Vmean):   2.88 cm AV Area (VTI):     3.00 cm AV Vmax:           101.00 cm/s AV Vmean:          70.600 cm/s AV VTI:            0.138 m AV Peak Grad:      4.1 mmHg AV Mean Grad:      2.0 mmHg LVOT Vmax:         82.90 cm/s LVOT Vmean:        53.400 cm/s LVOT VTI:          0.109 m LVOT/AV VTI ratio: 0.79  AORTA Ao Root diam: 3.40 cm Ao Asc diam:  3.50 cm MITRAL VALVE MV Area (PHT): 10.92 cm      SHUNTS MV Area VTI:   0.51 cm       Systemic VTI:  0.11 m MV VTI:        0.82 m         Systemic Diam: 2.20 cm MV Decel Time: 70 msec MR Peak grad:    64.0 mmHg MR Vmax:         400.00 cm/s MR PISA Nyquist: 0.3 m/s MR PISA:         3.08 cm MR PISA Radius:  0.70 cm MV E velocity: 93.40 cm/s MV A velocity: 912.00 cm/s MV E/A ratio:  0.10 Olga Millers MD Electronically signed by Olga Millers MD Signature Date/Time: 04/21/2023/1:26:34 PM    Final  MR BRAIN W WO CONTRAST  Result Date: 04/21/2023 CLINICAL DATA:  Acute neurologic deficit EXAM: MRI HEAD WITHOUT AND WITH CONTRAST MRA HEAD WITHOUT CONTRAST MRA NECK WITHOUT CONTRAST TECHNIQUE: Multiplanar, multiecho pulse sequences of the  brain and surrounding structures were obtained without and with intravenous contrast. Angiographic images of the Circle of Willis were obtained using MRA technique without intravenous contrast. Angiographic images of the neck were obtained using MRA technique without intravenous contrast. Carotid stenosis measurements (when applicable) are obtained utilizing NASCET criteria, using the distal internal carotid diameter as the denominator. CONTRAST:  7.7mL GADAVIST GADOBUTROL 1 MMOL/ML IV SOLN COMPARISON:  None Available. FINDINGS: MRI HEAD FINDINGS Brain: Acute infarct of the left PCA territory involving the left occipital and medial temporal lobes. Multiple punctate foci of acute ischemia within the right frontal lobe and right occipital lobe. Old left frontal and right parietal infarcts. There is multifocal hyperintense T2-weighted signal within the white matter. Generalized volume loss. The midline structures are normal. There is no abnormal contrast enhancement. Vascular: Major flow voids are preserved. Skull and upper cervical spine: Normal calvarium and skull base. Visualized upper cervical spine and soft tissues are normal. Sinuses/Orbits:Opacified sphenoid sinuses.  Normal orbits. MRA HEAD FINDINGS POSTERIOR CIRCULATION: --Vertebral arteries: Normal --Inferior cerebellar arteries: Normal. --Basilar artery: Normal. --Superior cerebellar arteries: Normal. --Posterior cerebral arteries: Moderate stenosis of the distal P2 segment of the right PCA. Occlusion of the left PCA P4 segment. ANTERIOR CIRCULATION: --Intracranial internal carotid arteries: Normal. --Anterior cerebral arteries (ACA): Normal. --Middle cerebral arteries (MCA): Normal. MRA NECK FINDINGS Motion degraded time-of-flight imaging of the carotid and vertebral systems. Postcontrast imaging could not be obtained due to software failure. The visible portions of the carotid and vertebral arteries are normal. The proximal vessels are not visualized.  IMPRESSION: 1. Acute infarct of the left PCA territory involving the left occipital and medial temporal lobes. No hemorrhage or mass effect. 2. Multiple punctate foci of acute ischemia within the right frontal lobe and right occipital lobe. 3. Occlusion of the left PCA P4 segment. Moderate stenosis of the distal P2 segment of the right PCA. 4. No visible occlusion in the neck. Electronically Signed   By: Deatra Robinson M.D.   On: 04/21/2023 01:33   MR ANGIO HEAD WO CONTRAST  Result Date: 04/21/2023 CLINICAL DATA:  Acute neurologic deficit EXAM: MRI HEAD WITHOUT AND WITH CONTRAST MRA HEAD WITHOUT CONTRAST MRA NECK WITHOUT CONTRAST TECHNIQUE: Multiplanar, multiecho pulse sequences of the brain and surrounding structures were obtained without and with intravenous contrast. Angiographic images of the Circle of Willis were obtained using MRA technique without intravenous contrast. Angiographic images of the neck were obtained using MRA technique without intravenous contrast. Carotid stenosis measurements (when applicable) are obtained utilizing NASCET criteria, using the distal internal carotid diameter as the denominator. CONTRAST:  7.60mL GADAVIST GADOBUTROL 1 MMOL/ML IV SOLN COMPARISON:  None Available. FINDINGS: MRI HEAD FINDINGS Brain: Acute infarct of the left PCA territory involving the left occipital and medial temporal lobes. Multiple punctate foci of acute ischemia within the right frontal lobe and right occipital lobe. Old left frontal and right parietal infarcts. There is multifocal hyperintense T2-weighted signal within the white matter. Generalized volume loss. The midline structures are normal. There is no abnormal contrast enhancement. Vascular: Major flow voids are preserved. Skull and upper cervical spine: Normal calvarium and skull base. Visualized upper cervical spine and soft tissues are normal. Sinuses/Orbits:Opacified sphenoid sinuses.  Normal orbits. MRA HEAD FINDINGS POSTERIOR CIRCULATION:  --Vertebral arteries: Normal --Inferior cerebellar  arteries: Normal. --Basilar artery: Normal. --Superior cerebellar arteries: Normal. --Posterior cerebral arteries: Moderate stenosis of the distal P2 segment of the right PCA. Occlusion of the left PCA P4 segment. ANTERIOR CIRCULATION: --Intracranial internal carotid arteries: Normal. --Anterior cerebral arteries (ACA): Normal. --Middle cerebral arteries (MCA): Normal. MRA NECK FINDINGS Motion degraded time-of-flight imaging of the carotid and vertebral systems. Postcontrast imaging could not be obtained due to software failure. The visible portions of the carotid and vertebral arteries are normal. The proximal vessels are not visualized. IMPRESSION: 1. Acute infarct of the left PCA territory involving the left occipital and medial temporal lobes. No hemorrhage or mass effect. 2. Multiple punctate foci of acute ischemia within the right frontal lobe and right occipital lobe. 3. Occlusion of the left PCA P4 segment. Moderate stenosis of the distal P2 segment of the right PCA. 4. No visible occlusion in the neck. Electronically Signed   By: Deatra Robinson M.D.   On: 04/21/2023 01:33   MR ANGIO NECK W WO CONTRAST  Result Date: 04/21/2023 CLINICAL DATA:  Acute neurologic deficit EXAM: MRI HEAD WITHOUT AND WITH CONTRAST MRA HEAD WITHOUT CONTRAST MRA NECK WITHOUT CONTRAST TECHNIQUE: Multiplanar, multiecho pulse sequences of the brain and surrounding structures were obtained without and with intravenous contrast. Angiographic images of the Circle of Willis were obtained using MRA technique without intravenous contrast. Angiographic images of the neck were obtained using MRA technique without intravenous contrast. Carotid stenosis measurements (when applicable) are obtained utilizing NASCET criteria, using the distal internal carotid diameter as the denominator. CONTRAST:  7.37mL GADAVIST GADOBUTROL 1 MMOL/ML IV SOLN COMPARISON:  None Available. FINDINGS: MRI HEAD  FINDINGS Brain: Acute infarct of the left PCA territory involving the left occipital and medial temporal lobes. Multiple punctate foci of acute ischemia within the right frontal lobe and right occipital lobe. Old left frontal and right parietal infarcts. There is multifocal hyperintense T2-weighted signal within the white matter. Generalized volume loss. The midline structures are normal. There is no abnormal contrast enhancement. Vascular: Major flow voids are preserved. Skull and upper cervical spine: Normal calvarium and skull base. Visualized upper cervical spine and soft tissues are normal. Sinuses/Orbits:Opacified sphenoid sinuses.  Normal orbits. MRA HEAD FINDINGS POSTERIOR CIRCULATION: --Vertebral arteries: Normal --Inferior cerebellar arteries: Normal. --Basilar artery: Normal. --Superior cerebellar arteries: Normal. --Posterior cerebral arteries: Moderate stenosis of the distal P2 segment of the right PCA. Occlusion of the left PCA P4 segment. ANTERIOR CIRCULATION: --Intracranial internal carotid arteries: Normal. --Anterior cerebral arteries (ACA): Normal. --Middle cerebral arteries (MCA): Normal. MRA NECK FINDINGS Motion degraded time-of-flight imaging of the carotid and vertebral systems. Postcontrast imaging could not be obtained due to software failure. The visible portions of the carotid and vertebral arteries are normal. The proximal vessels are not visualized. IMPRESSION: 1. Acute infarct of the left PCA territory involving the left occipital and medial temporal lobes. No hemorrhage or mass effect. 2. Multiple punctate foci of acute ischemia within the right frontal lobe and right occipital lobe. 3. Occlusion of the left PCA P4 segment. Moderate stenosis of the distal P2 segment of the right PCA. 4. No visible occlusion in the neck. Electronically Signed   By: Deatra Robinson M.D.   On: 04/21/2023 01:33   US RENAL  Result Date: 04/20/2023 CLINICAL DATA:  Acute kidney injury. EXAM: RENAL / URINARY  TRACT ULTRASOUND COMPLETE COMPARISON:  PET-CT 01/08/2022 FINDINGS: Right Kidney: Renal measurements: 10.6 x 4.7 x 4.1 cm = volume: 106 mL. Echogenicity within normal limits. No mass or hydronephrosis visualized. Left  Kidney: Renal measurements: 9.4 x 4.7 x 5.0 = volume: 116 mL. Echogenicity within normal limits. No mass or hydronephrosis visualized. Bladder: Urinary bladder was empty and not well visualized. Other: None. IMPRESSION: Normal sonographic evaluation of the kidneys. Urinary bladder was empty and not well visualized. Electronically Signed   By: Sherron Ales M.D.   On: 04/20/2023 19:27   DG Chest Port 1 View  Result Date: 04/20/2023 CLINICAL DATA:  Altered mental status EXAM: PORTABLE CHEST 1 VIEW COMPARISON:  Chest x-ray January 18 2022. Chest CT November 25, 2022 and Apr 18, 2023 FINDINGS: A left pleural effusion is identified. Opacity is identified in the lingula/lower lobe better appreciated on the Apr 18, 2023 CT and probably similar in the interval. This finding is increased compared to the comparison chest x-ray. The right lung remains clear. The cardiomediastinal silhouette is stable. No pneumothorax. IMPRESSION: 1. Opacity in the lingula/lower lobe has increased compared to the prior chest x-ray. This finding is better appreciated on the Apr 18, 2023 CT and probably similar in the interval. 2. A left pleural effusion is identified. Electronically Signed   By: Gerome Sam III M.D.   On: 04/20/2023 17:49   CT Head Wo Contrast  Result Date: 04/20/2023 CLINICAL DATA:  Mental status change. EXAM: CT HEAD WITHOUT CONTRAST TECHNIQUE: Contiguous axial images were obtained from the base of the skull through the vertex without intravenous contrast. RADIATION DOSE REDUCTION: This exam was performed according to the departmental dose-optimization program which includes automated exposure control, adjustment of the mA and/or kV according to patient size and/or use of iterative reconstruction  technique. COMPARISON:  Brain MRI 01/22/2022 FINDINGS: Brain: Within the medial LEFT occipital lobe there is a geographic region of low cortical density along the posterior falx suggesting a LEFT posterior cerebral arterial infarction. Infarcted region measures 5.7 by 2.5 by 2.5 cm (volume = 19 cm^3) Regional gliosis and encephalomalacia in the LEFT frontal lobe is unchanged. No intracranial hemorrhage.  No midline shift or mass effect. Vascular: No hyperdense vessel or unexpected calcification. Skull: Normal. Negative for fracture or focal lesion. Sinuses/Orbits: Extensive thickening of the maxillary sinus walls. There is evidence of prior ethmoid surgery. Mucosal thickening and fluid levels within the paranasal sinuses. Mucosal thickening extends in the frontal sinus Other: None IMPRESSION: 1. Acute or subacute infarction in the LEFT posterior cerebral artery territory (medial LEFT occipital lobe). 2. No intracranial hemorrhage. 3. Chronic LEFT frontal lobe infarction. 4. Acute on chronic sinusitis. Electronically Signed   By: Genevive Bi M.D.   On: 04/20/2023 16:00    Cardiac Studies   Echo IMPRESSIONS     1. Akinesis of the anteroseptal wall and apex with overall severe LV  dysfunction.   2. Left ventricular ejection fraction, by estimation, is 25 to 30%. The  left ventricle has severely decreased function. The left ventricle  demonstrates regional wall motion abnormalities (see scoring  diagram/findings for description). Left ventricular  diastolic parameters are indeterminate.   3. Right ventricular systolic function is normal. The right ventricular  size is normal.   4. A small pericardial effusion is present.   5. The mitral valve is normal in structure. Mild mitral valve  regurgitation. No evidence of mitral stenosis.   6. The aortic valve is tricuspid. Aortic valve regurgitation is not  visualized. Aortic valve sclerosis is present, with no evidence of aortic  valve stenosis.    7. The inferior vena cava is normal in size with greater than 50%  respiratory variability,  suggesting right atrial pressure of 3 mmHg.   Comparison(s): No prior Echocardiogram.   Patient Profile     72 y.o. male with T4N2M0 nonsmall cell lung CA. Presents with acute posterior stroke and ASMI  Assessment & Plan    Acute anteroseptal infarct. Onset unclear as patient never had chest pain. Troponin >24K. Ecg with ST elevation in anteroseptal leads with Q waves 1, Avl, V1-4. Suspect infarct completed. Echo with EF 25-30% with large area of anterior and apical infarct. Medical therapy limited by low BP. Will proceed with IV heparin for 48 hours.  Would add plavix. Plan to initiate Eliquis. Would not pursue invasive cardiac evaluation given poor prognosis from standpoint of lung CA and stroke.  Acute systolic CHF. EF 25-30% secondary to ischemic CM. Was started on Coreg but BP this am dropped to 76/37. Coreg held. Would hold off starting aldactone for now until BP a little more stable. Fortunately renal function is improving. On Farxiga.  Acute posterior stroke. No hemorrhage. Per Neuro Non small cell lung CA. S/p chemoradiation. Had CT chest on 5/10 - no metastases or growth compared to prior.  History of HTN. Now with low BP. Antihypertensives on hold.  Acute kidney injury. Renal US normal. No obstruction. Did have contrast CT on 5/10. Renal function a little better today. Supportive care.      For questions or updates, please contact [facility] Please consult [email or web address] for contact info under        Signed, Tatia Petrucci [place], MD  04/22/2023, 8:31 AM

## 2023-04-22 NOTE — Progress Notes (Signed)
NAME:  Peter Diaz, MRN:  [id number], DOB:  12-10-1950, LOS: 2 ADMISSION DATE:  04/20/2023, CONSULTATION DATE:  04/19/2023 REFERRING MD:  Dr. Durwin Nora, CHIEF COMPLAINT:  Stroke/STEMI    History of Present Illness:  Peter Diaz is a 72 y.o. male with a PMH significant for HTN, stage IIIb non-small lung cancer, receiving immunotherapy, gout, and GERD who presented to [facility] 5/11 with AMS, oriented only to self admission..  Of note he was recently treated for drug-induced rash with steroid taper 5 weeks ago.  Per EMS they observed tachycardia, tachypnea, and hypotension with EKG concerning for ST elevation.  Code STEMI was activated and cardiology was called patient was taken to cath lab from ED.  Prior to cath, given AMS on arrival head CT was also obtained and initial read was negative for ICH however acute stroke identified, heart cath cancelled.   On ED arrival patient was seen tachypneic, tachycardic, and mildly hypotensive.  Pertinent lab work included CO2 19, glucose 102, creatinine 3.06, albumin 2.1, AST 75, high-sensitivity troponin greater than 24,000, HLD 32, WBC 17.4, INR 1.2.  Head CT positive for acute or subacute infarction in the left posterior cerebral artery territory, no intracranial hemorrhage.  Given new CVA with likely evolving ACS PCCM consulted for further management and admission  Pertinent  Medical History  HTN,l ung cancer receiving immunotherapy, gout, and GERD  Significant Hospital Events: Including procedures, antibiotic start and stop dates in addition to other pertinent events   5/11 presented with altered mental status code is to be activated, cath canceled with new acute/subacute stroke also identified 5/13 MRI suggesting stroke is acute. L P4 occlusion R P2 stenosis. ECHO   Interim History / Subjective:  Wife reports pt hallucinating overnight, restless, given trazodone for sleep.   Remains confused this am Incontinent of stool > FMS placed, stool dark/  black appearing Hgb 11.8> 10 SBP in 80-low 100's overnight  Objective   Blood pressure (!) 92/53, pulse 99, temperature 97.6 F (36.4 C), temperature source Axillary, resp. rate 20, height 5' 7.99" (1.727 m), weight 77.5 kg, SpO2 100 %.        Intake/Output Summary (Last 24 hours) at 04/22/2023 0721 Last data filed at 04/22/2023 0700 Gross per 24 hour  Intake 1565.94 ml  Output 1630 ml  Net -64.06 ml   Filed Weights   04/21/23 0500 04/21/23 0800 04/22/23 0500  Weight: 77 kg 77 kg 77.5 kg   Examination: General:  AoC ill appearing elderly male lying in bed in NAD HEENT: MM pink/dry, pupils 3/reactive, anicteric, no JVD,  Neuro: Awake, confused, oriented to person only, MAE, no appreciated weakness CV: rr, ST/ NSR, no murmur PULM:  non labored, tachypneic 20's, right clear, left rales/ rhonchi, diminished in left base, congested np cough GI: soft, bs+, NT, ND, purwick, FMS- black appearing stool Extremities: warm/dry, no LE edema  Skin: no rashes, scattered bruising to arms   Labs > K 3.7, BUN/ sCr 40/ 2.61> 51/ 2.19, BNP 1985, CO2 21> 18, WBC 14.5> 15.3, H/H 11.8/ 38.3> 10/ 32.2, heparin levels remains low  BC ngtd Afebrile UOP 1.3L/ 24hrs Net +330ml  CT abd/ pelvis> no evidence of metastatic disease in abd/ pelvis, moderate left, small right pleural effusions, cholelithiasis, 2.36 low attentuation area on pancreatitic head, likely cystic, further evaluation recommended with MRI    Resolved Hospital Problem list     Assessment & Plan:   Acute metabolic encephalopathy/ delirium P: - supportive care, correct metabolic  derangements  - delirium precautions - trazodone prn sleep qhs  Acute L PCA territory infarct  L P4 occlusion  R P2 moderate stenosis  R frontal lobe R occipital lobe acute punctate ischemia  - felt either 2/2 to hypercoagulable state from advanced NSCLC vs cardioembolic from STEMI/ low EF cardiomyopathy   P - Neuro following - recs for eliquis per  oncology/ neurology discussion after 48hrs of IV heparin, with ASA> plavix.  Holding plavix and heparin today given concern for possible GIB - PT/ OT, rehab efforts   Acute anteroseptal infarct STEMI with acute systolic HF P: - ASA, add plavix today.  Continue IV heparin gtt x 48 hrs then with plans to switch to eliquis as above however given gram drop in Hgb and dark stools, concerned for bleeding.  Repeat H/H now. Hold plavix till H/H back.  Heparin levels remain low.  PPI BID added empirically  - no invasive eval given comorbidities/ acute CVA - GDMT> carvedilol and farxiga> BP soft overnight, will hold coreg today  - diurese prn> not needed today and BP will not tolerate  - TTE EF 25-30%, +RWMA, mild MR  AKI -normal renal US  P: - improving sCr.  Hold on diurese today - dropping bicarb> likely attributed to diarrhea.  Monitor closely - Trend BMP / urinary output - Replace electrolytes as indicated - Avoid nephrotoxic agents, ensure adequate renal perfusion  Stg IIIb NSCLC LUL (dx 01/2022) Possible Postobstructive PNA L pleural effusion Acute hypoxic respiratory failure Dysphagia   -on imfinzi q4wk -- s/p 12 cycles.Final (13th) cycled dc due to immunotherapy mediated rash for which he has been on high dose steroid taper, intolerant of immunotherapy with gradual functional decline P: - supplemental O2 prn  - pulmonary hygiene - cont ceftriaxone for 5 days course  - hold diureses today  - aspiration precautions, SLP following  Elevated AST  P: - PRN LFT   Functional decline At risk for malnutrition - did not eat much yesterday.  Will need to monitor intake, if poor ongoing talks for GOC vs cortrak  Normocytic anemia  R/o GIB Diarrhea - Hgb 11.8> 10 with dark stools - recheck H/H q 6 - add PPI BID - holding heparin, ASA, plavix for now - CT a/p neg, clinical exam benign - imodium prn   GOC  -Wife had discussed code status with brother 5/12 and arrived at Full Code,  buyt in our convo it was clear Code status was not well understood. We clarified what this means, what happens during a code etc. They feel strongly he would never want to be on MV. I have rec DNR code status. She will d/w pt brother (does not want to make solo decision) -family references he has been more tired, withdrawn. Not participating with ADLs as much and more stationary. We talked broadly about the difference between aggressive medical tx vs palliative care and discussed code status. I have encouraged family to talk about QOL goals, what is important to pt etc, and to consider discussing palliative approach to his care especially in light of these new insults.  - spoke with wife 5/14 am, concerns relayed given his STEMI/ HFrEF, acute CVA, AKI and chronic comorbidies with with functional decline.  Remains full code. Reports she is concerned as well.  Has meeting with PMT with his brother later today to talk more on GOC.  Seems hesitant to make decisions without family involvement.  Best Practice (right click and "Reselect all SmartList Selections" daily)  Diet/type: NPO> dysphagia 3 diet DVT prophylaxis: systemic heparin GI prophylaxis: PPI Lines: N/A Foley:  N/A Code Status:  full code Last date of multidisciplinary goals of care discussion: 5/13 -- encouraged family to discuss code status and GOC   Wife updated 5/14 at bedside am.    Labs   CBC: Recent Labs  Lab 04/20/23 1522 04/21/23 0137 04/21/23 1955 04/22/23 0210  WBC 17.4* 14.5*  --  15.3*  NEUTROABS 15.2*  --   --   --   HGB 11.3* 11.8* 11.7* 10.0*  HCT 37.2* 38.3* 37.4* 32.2*  MCV 86.9 84.7  --  85.0  PLT 252 279  --  320    Basic Metabolic Panel: Recent Labs  Lab 04/20/23 1522 04/21/23 0137 04/22/23 0210  NA 136 136 135  K 4.6 4.1 3.7  CL 103 101 103  CO2 19* 21* 18*  GLUCOSE 102* 77 90  BUN 39* 40* 51*  CREATININE 3.06* 2.61* 2.19*  CALCIUM 7.6* 7.9* 7.5*  MG 1.9 2.1  --   PHOS  --  4.4  --     GFR: Estimated Creatinine Clearance: 29.5 mL/min (A) (by C-G formula based on SCr of 2.19 mg/dL (H)). Recent Labs  Lab 04/20/23 1522 04/21/23 0137 04/21/23 1450 04/22/23 0210  WBC 17.4* 14.5*  --  15.3*  LATICACIDVEN  --   --  1.4  --     Liver Function Tests: Recent Labs  Lab 04/20/23 1522  AST 75*  ALT 43  ALKPHOS 76  BILITOT 1.2  PROT 5.8*  ALBUMIN 2.1*   No results for input(s): "LIPASE", "AMYLASE" in the last 168 hours. No results for input(s): "AMMONIA" in the last 168 hours.  ABG    Component Value Date/Time   TCO2 23 01/11/2022 0716     Coagulation Profile: Recent Labs  Lab 04/20/23 1522  INR 1.3*    Cardiac Enzymes: No results for input(s): "CKTOTAL", "CKMB", "CKMBINDEX", "TROPONINI" in the last 168 hours.  HbA1C: Hgb A1c MFr Bld  Date/Time Value Ref Range Status  04/20/2023 03:22 PM 5.2 4.8 - 5.6 % Final    Comment:    (NOTE) Pre diabetes:          5.7%-6.4%  Diabetes:              >6.4%  Glycemic control for   <7.0% adults with diabetes     CBG: Recent Labs  Lab 04/20/23 1638 04/20/23 2121  GLUCAP 93 88   CCT: 35 mins     Posey Boyer, MSN, AG-ACNP-BC [facility] 04/22/2023, 7:21 AM  See Amion for pager If no response to pager, please call PCCM consult pager After 7:00 pm call Elink

## 2023-04-23 DIAGNOSIS — I2102 ST elevation (STEMI) myocardial infarction involving left anterior descending coronary artery: Secondary | ICD-10-CM | POA: Diagnosis not present

## 2023-04-23 DIAGNOSIS — I213 ST elevation (STEMI) myocardial infarction of unspecified site: Secondary | ICD-10-CM | POA: Diagnosis not present

## 2023-04-23 DIAGNOSIS — K921 Melena: Secondary | ICD-10-CM

## 2023-04-23 DIAGNOSIS — J9601 Acute respiratory failure with hypoxia: Secondary | ICD-10-CM | POA: Diagnosis not present

## 2023-04-23 DIAGNOSIS — I63532 Cerebral infarction due to unspecified occlusion or stenosis of left posterior cerebral artery: Secondary | ICD-10-CM | POA: Diagnosis not present

## 2023-04-23 NOTE — Progress Notes (Signed)
Palliative Medicine Progress Note   Patient Name: Peter Diaz       Date: 04/23/2023 DOB: December 10, 1950  Age: 72 y.o. MRN#: [id number] Attending Physician: Rolly Salter, MD Primary Care Physician: [name], MD Admit Date: 04/20/2023    HPI/Patient Profile: 72 y.o. male  with past medical history of stage IIIb non-small cell lung cancer, HTN, gout, and GERD who presented to the ED on 04/20/2023 with altered mental status.  EKG was concerning for ST elevation, code STEMI was activated, and patient was taken to the cath lab.  However head CT was negative for ICH but revealed acute stroke and cardiac cath was canceled. He has multiple acute issues including acute metabolic encephalopathy, acute left PCA infarct, STEMI, AKI, acute hypoxic respiratory failure, and possible postobstructive pneumonia.    Lung cancer was initially diagnosed in February 2023. He is status post chemotherapy and radiation with partial response. He started immunotherapy in May 2023, and is status post 12 of 13 cycles (last dose given 02/19/23). He was tolerating well until he developed a significant rash in April, and it was decided to not proceed with the last cycle of immunotherapy. Plan was for CT chest in May for restaging of his disease.    Palliative Medicine has been consulted for goals of care.  Subjective: Chart reviewed. No further GI bleeding overnight.  Bedside visit. Patient is resting/sleeping comfortably, but will awaken easily and answer simple questions. He is not eating but will accept sips of liquid.   Discussed with family (wife and daughter) that patient does not seem to be imminently dying, at least at this time. Discussed option for inpatient hospice facility versus home with hospice.   Family  states their preference for home with hospice. Provided education and counseling on the benefits of hospice care. Discussed the hospice team includes RNs, physicians, social workers, and chaplains. They can provide personal care, support for the family, symptom management, as well as DME.   Emotional support provided throughout visit.     Objective:  Physical Exam Vitals reviewed.  Constitutional:      General: He is not in acute distress.    Appearance: He is ill-appearing.  Pulmonary:     Effort: Pulmonary effort is normal.  Neurological:     Mental Status: He is lethargic and confused.  Palliative Medicine Assessment & Plan   Assessment: Principal Problem:   STEMI (ST elevation myocardial infarction) (HCC) Active Problems:   Cerebrovascular accident (CVA) due to occlusion of left posterior cerebral artery (HCC)   Acute respiratory failure with hypoxia (HCC)   Encephalopathy acute   Gastrointestinal hemorrhage with melena    Recommendations/Plan: Continue comfort focused care Home with hospice - family states their preference for [facility] Will need comfort meds at discharge - recommend ativan and oxycodone IR  Code Status: DNR   Prognosis:  < 2 weeks  Discharge Planning: Home with Hospice   Thank you for allowing the Palliative Medicine Team to assist in the care of this patient.   MD - High   Merry Proud, NP   Please contact Palliative Medicine Team phone at [phone] for questions and concerns.  For individual providers, please see AMION.

## 2023-04-23 NOTE — Plan of Care (Signed)

## 2023-04-23 NOTE — TOC Initial Note (Signed)
Transition of Care [facility]) - Initial/Assessment Note    Patient Details  Name: Peter Diaz MRN: [id number] Date of Birth: Apr 12, 1951  Transition of [facility]) CM/SW Contact:    Durenda Guthrie, RN Phone Number: 04/23/2023, 11:43 AM  Clinical Narrative:                  Case Manager advised that Patient and family have decided on Home with Hospice Care. They have selected [facility], referral has been sent and they will be contacted by Jule Ser Liaision with [facility]. TOC Team will continue to follow.      Patient Goals and CMS Choice     Choice offered to / list presented to : [facility] POA / Guardian, Spouse, Sibling      Expected Discharge Plan and Services                                     [facility] Agency: [facility]) Date [facility] Agency Contacted: 04/23/23 Time HH Agency Contacted: [id number] Representative spoke with at [facility] Agency: Dionicio Stall  Prior Living Arrangements/Services              Need for Family Participation in Patient Care: Yes (Comment) Care giver support system in place?: Yes (comment)   Criminal Activity/Legal Involvement Pertinent to Current Situation/Hospitalization: No - Comment as needed  Activities of Daily Living      Permission Sought/Granted                  Emotional Assessment         Alcohol / Substance Use: Not Applicable Psych Involvement: No (comment)  Admission diagnosis:  Disorientation [R41.0] STEMI (ST elevation myocardial infarction) (HCC) [I21.3] ST elevation myocardial infarction (STEMI), unspecified artery (HCC) [I21.3] Cerebrovascular accident (CVA) due to occlusion of left posterior cerebral artery (HCC) [I63.532] Patient Active Problem List   Diagnosis Date Noted   Gastrointestinal hemorrhage with melena 04/22/2023   Cerebrovascular accident (CVA) due to occlusion of left posterior cerebral artery (HCC) 04/21/2023   Acute respiratory failure  with hypoxia (HCC) 04/21/2023   Encephalopathy acute 04/21/2023   STEMI (ST elevation myocardial infarction) (HCC) 04/20/2023   Rash 03/19/2023   Hypokalemia 11/27/2022   Encounter for antineoplastic immunotherapy 04/08/2022   Squamous cell carcinoma of 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 [place] [address] Phone: [phone] Fax: [phone]     Social Determinants of Health (SDOH) Social History: SDOH Screenings   Tobacco Use: Medium Risk (04/20/2023)   SDOH Interventions:     Readmission Risk Interventions     No data to display

## 2023-04-23 NOTE — Hospital Course (Signed)
PMH of stage IIIb lung cancer, prior left frontal lobe abscess SP evacuation, HTN present to the hospital with confusion. Was found to have STEMI as well as acute CVA. Admitted to the ICU.  Cardiology and neurology both were consulted.  Code STEMI was called by ED but patient did not underwent cardiac catheterization given his presentation with acute stroke.  Developed melena during the hospitalization as well as AKI complicating management with family recently transition to complete comfort.

## 2023-04-23 NOTE — Progress Notes (Signed)
Inpatient Rehab Admissions Coordinator:   GOC noted.  Will sign off.   Estill Dooms, PT, DPT Admissions Coordinator [phone] 04/23/23  2:55 PM

## 2023-04-23 NOTE — Progress Notes (Signed)
[employer], [employer] [facility]) Hospital Liaison Note  Referral received for patient/family interest in hospice at home. [facility] liaison met with patients family to confirm interest. Interest confirmed.   Plan is to discharge home tomorrow after DME is delivered. Hospital bed, over the bed table, wheelchair, and APP mattress has been ordered.   Please send patient with comfort medications/prescriptions at discharge.   Please call with any questions or concerns. Thank you  Dionicio Stall, [facility] Liaison [phone]

## 2023-04-23 NOTE — Plan of Care (Signed)
Noted pt in comfort care now. Neurology will sign off for now, please feel free to call for any questions. Thanks   Marvel Plan, MD PhD Stroke Neurology 04/23/2023 3:49 PM

## 2023-04-23 NOTE — Progress Notes (Signed)
[facility] Progress Note Patient: Peter Diaz:[id number] DOB: December 02, 1951 DOA: 04/20/2023  DOS: the patient was seen and examined on 04/23/2023  Brief hospital course: PMH of stage IIIb lung cancer, prior left frontal lobe abscess SP evacuation, HTN present to the hospital with confusion. Was found to have STEMI as well as acute CVA. Admitted to the ICU.  Cardiology and neurology both were consulted.  Code STEMI was called by ED but patient did not underwent cardiac catheterization given his presentation with acute stroke.  Developed melena during the hospitalization as well as AKI complicating management with family recently transition to complete comfort. Assessment and Plan: Acute anteroseptal infarct STEMI with acute systolic HF Was on aspirin and Plavix. Was also on IV heparin. Developed GI bleed and therefore all medications are now on hold. Currently comfort care.  Echocardiogram showed EF of 25 to 30%. Treated with wall motion abnormality seen as well as mild MR seen. Not a candidate for GDMT given his anemia. Cardiology was consulted. Conservative management. Comfort care.  Acute L PCA territory infarct  L P4 occlusion  R P2 moderate stenosis  R frontal lobe R occipital lobe acute punctate ischemia  Presented with confusion. CT of the head showed evidence of acute CVA. Felt either 2/2 to hypercoagulable state from advanced NSCLC vs cardioembolic from STEMI/ low EF cardiomyopathy   Neurology was consulted. Recommendation was for Eliquis per oncology/ neurology discussion after 48hrs of IV heparin, with ASA PT OT also recommended CIR. Now with GI bleed. Currently comfort care.  Normocytic anemia/acute blood loss anemia Melena, most likely upper GI bleed Diarrhea Baseline hemoglobin around 13. Current hemoglobin around 10. Developed melena with diarrhea initially started on PPI twice daily. CT a/p neg, clinical exam benign After discussion with the family  patient currently comfort care.  Stg IIIb NSCLC LUL (dx 01/2022) Possible Postobstructive PNA L pleural effusion Acute hypoxic respiratory failure Dysphagia   on imfinzi q4wk -- s/p 12 cycles.Final (13th) cycled dc due to immunotherapy mediated rash for which he has been on high dose steroid taper, intolerant of immunotherapy with gradual functional decline Speech therapy was consulted.  Recommended dysphagia 3 diet. Now comfort care.  Acute metabolic encephalopathy. Continue delirium precaution.  Acute kidney injury. Likely prerenal in the setting of diarrhea. Also hypertension causing ischemic injury. Ultrasound renal normal. Monitor.  Goals of care conversation. Palliative care was consulted. PCCM discussed in detail with regards to goals of care with the family. Currently transitioning to complete comfort.  Plan is to go home with hospice.  Subjective: Denies any acute complaint.  No nausea no vomiting.  Physical Exam: General: in Mild distress, No Rash Cardiovascular: S1 and S2 Present, No Murmur Respiratory: Good respiratory effort, Bilateral Air entry present. No Crackles, No wheezes Abdomen: Bowel Sound present, No tenderness Extremities: No edema Neuro: Alert and oriented to self only, no new focal deficit  Data Reviewed: I have Reviewed nursing notes, Vitals, and Lab results. I have discussed pt's care plan and test results with palliative care  .   Disposition: Status is: Inpatient Remains inpatient appropriate because: Awaiting for arrangement of hospice at home.  Family Communication: Family at bedside Level of care: Med-Surg   Vitals:   04/22/23 1400 04/22/23 1500 04/22/23 2117 04/23/23 0754  BP: 97/62 (!) 92/59 (!) 82/55 (!) 148/92  Pulse: (!) 112 (!) 110 (!) 45 (!) 59  Resp: (!) 29 (!) 23 20 16   Temp:   99.1 F (37.3 C) 97.7 F (36.5 C)  TempSrc:   Oral Oral  SpO2: 97% 100% (!) 87% (!) 88%  Weight:      Height:         Author: Lynden Oxford,  MD 04/23/2023 7:31 PM  Please look on [email or web address] to find out who is on call.

## 2023-04-23 NOTE — Progress Notes (Signed)
Rounding Note    Patient Name: Peter Diaz Date of Encounter: 04/23/2023  [facility] Cardiologist: None new  Subjective   Patient denies any complaints. Denies any chest pain or SOB. Note GI bleed yesterday.   Inpatient Medications    Scheduled Meds:  mouth rinse  15 mL Mouth Rinse 4 times per day   Continuous Infusions:   PRN Meds: acetaminophen **OR** acetaminophen (TYLENOL) oral liquid 160 mg/5 mL **OR** acetaminophen, Gerhardt's butt cream, glycopyrrolate **OR** glycopyrrolate **OR** glycopyrrolate, LORazepam, morphine injection, mouth rinse, mouth rinse, polyvinyl alcohol   Vital Signs    Vitals:   04/22/23 1400 04/22/23 1500 04/22/23 2117 04/23/23 0754  BP: 97/62 (!) 92/59 (!) 82/55 (!) 148/92  Pulse: (!) 112 (!) 110 (!) 45 (!) 59  Resp: (!) 29 (!) 23 20 16   Temp:   99.1 F (37.3 C) 97.7 F (36.5 C)  TempSrc:   Oral Oral  SpO2: 97% 100% (!) 87% (!) 88%  Weight:      Height:        Intake/Output Summary (Last 24 hours) at 04/23/2023 1021 Last data filed at 04/22/2023 2015 Gross per 24 hour  Intake 320 ml  Output 1624 ml  Net -1304 ml       04/22/2023    5:00 AM 04/21/2023    8:00 AM 04/21/2023    5:00 AM  Last 3 Weights  Weight (lbs) 170 lb 13.7 oz 169 lb 12.1 oz 169 lb 12.1 oz  Weight (kg) 77.5 kg 77 kg 77 kg      Telemetry    Not on tele - Personally Reviewed  ECG    NSR with acute anteroseptal infarct.  - Personally Reviewed  Physical Exam   GEN: No acute distress.   Neck: No JVD Cardiac: RRR, no murmurs, rubs, or gallops.  Respiratory: Clear to auscultation bilaterally. GI: Soft, nontender, non-distended, rectal catheter in place MS: No edema; No deformity. Neuro:  oriented to self and place.  Psych: Normal affect   Labs    High Sensitivity Troponin:   Recent Labs  Lab 04/20/23 1522  TROPONINIHS >24,000*      Chemistry Recent Labs  Lab 04/20/23 1522 04/21/23 0137 04/22/23 0210  NA 136 136 135  K 4.6 4.1  3.7  CL 103 101 103  CO2 19* 21* 18*  GLUCOSE 102* 77 90  BUN 39* 40* 51*  CREATININE 3.06* 2.61* 2.19*  CALCIUM 7.6* 7.9* 7.5*  MG 1.9 2.1  --   PROT 5.8*  --   --   ALBUMIN 2.1*  --   --   AST 75*  --   --   ALT 43  --   --   ALKPHOS 76  --   --   BILITOT 1.2  --   --   GFRNONAA 21* 25* 31*  ANIONGAP 14 14 14      Lipids  Recent Labs  Lab 04/20/23 1522  CHOL 100  TRIG 69  HDL 32*  LDLCALC 54  CHOLHDL 3.1     Hematology Recent Labs  Lab 04/20/23 1522 04/21/23 0137 04/21/23 1955 04/22/23 0210 04/22/23 0613  WBC 17.4* 14.5*  --  15.3*  --   RBC 4.28 4.52  --  3.79*  --   HGB 11.3* 11.8* 11.7* 10.0* 10.5*  HCT 37.2* 38.3* 37.4* 32.2* 34.6*  MCV 86.9 84.7  --  85.0  --   MCH 26.4 26.1  --  26.4  --   MCHC 30.4  30.8  --  31.1  --   RDW 15.5 15.6*  --  15.4  --   PLT 252 279  --  320  --     Thyroid No results for input(s): "TSH", "FREET4" in the last 168 hours.  BNP Recent Labs  Lab 04/22/23 0210  BNP 1,985.7*     DDimer No results for input(s): "DDIMER" in the last 168 hours.   Radiology    CT ABDOMEN PELVIS WO CONTRAST  Result Date: 04/21/2023 CLINICAL DATA:  Non-small cell lung cancer staging. EXAM: CT ABDOMEN AND PELVIS WITHOUT CONTRAST TECHNIQUE: Multidetector CT imaging of the abdomen and pelvis was performed following the standard protocol without IV contrast. RADIATION DOSE REDUCTION: This exam was performed according to the departmental dose-optimization program which includes automated exposure control, adjustment of the mA and/or kV according to patient size and/or use of iterative reconstruction technique. COMPARISON:  PET-CT 01/08/2022 FINDINGS: Lower chest: Small right and moderate left pleural effusions are partially visualized. Hepatobiliary: Gallstones are present. There is no biliary ductal dilatation. The liver is within normal limits. Pancreas: There is a 2.6 x 1.7 cm low-attenuation area abutting and anterior to the pancreatic head image  3/37, likely cystic. Otherwise, the pancreas is within normal limits. Spleen: Normal in size without focal abnormality. Adrenals/Urinary Tract: Adrenal glands are unremarkable. Kidneys are normal, without renal calculi, focal lesion, or hydronephrosis. Bladder is unremarkable. Stomach/Bowel: Stomach is within normal limits. Appendix appears normal. No evidence of bowel wall thickening, distention, or inflammatory changes. Vascular/Lymphatic: Aortic atherosclerosis. No enlarged abdominal or pelvic lymph nodes. Reproductive: Prostate gland is enlarged. Other: There is a small fat containing right inguinal hernia. There is no ascites. Musculoskeletal: No acute or significant osseous findings. IMPRESSION: 1. No evidence for metastatic disease in the abdomen or pelvis. 2. Moderate left and small right pleural effusions. 3. Cholelithiasis. 4. 2.6 cm low-attenuation area abutting and anterior to the pancreatic head, likely cystic. Recommend further evaluation with MRI. Aortic Atherosclerosis (ICD10-I70.0). Electronically Signed   By: Darliss Cheney M.D.   On: 04/21/2023 21:26   VAS [place] LOWER EXTREMITY VENOUS (DVT)  Result Date: 04/21/2023  Lower Venous DVT Study Patient Name:  Peter Diaz  Date of Exam:   04/21/2023 Medical Rec #: [id number]       Accession #:    [id number] Date of Birth: 07/11/1951       Patient Gender: M Patient Age:   72 years Exam Location:  [facility] Procedure:      VAS [place] LOWER EXTREMITY VENOUS (DVT) Referring Phys: Levon Hedger --------------------------------------------------------------------------------  Indications: Stroke. Embolic.  Risk Factors: Cancer - lung. Comparison Study: No prior studies. Performing Technologist: Jean Rosenthal RDMS, RVT  Examination Guidelines: A complete evaluation includes B-mode imaging, spectral Doppler, color Doppler, and power Doppler as needed of all accessible portions of each vessel. Bilateral testing is considered an integral part of a complete  examination. Limited examinations for reoccurring indications may be performed as noted. The reflux portion of the exam is performed with the patient in reverse Trendelenburg.  +---------+---------------+---------+-----------+---------------+-------------+ RIGHT    CompressibilityPhasicitySpontaneityProperties     Thrombus  Aging         +---------+---------------+---------+-----------+---------------+-------------+ CFV      Full           Yes      Yes                                     +---------+---------------+---------+-----------+---------------+-------------+ SFJ      Full                                                            +---------+---------------+---------+-----------+---------------+-------------+ FV Prox  Full                                                            +---------+---------------+---------+-----------+---------------+-------------+ FV Mid   Partial        Yes      Yes        brightly       Chronic                                                   echogenic                    +---------+---------------+---------+-----------+---------------+-------------+ FV DistalFull                                                            +---------+---------------+---------+-----------+---------------+-------------+ PFV      Full                                                            +---------+---------------+---------+-----------+---------------+-------------+ POP      Full           Yes      Yes                                     +---------+---------------+---------+-----------+---------------+-------------+ PTV      Full                                                            +---------+---------------+---------+-----------+---------------+-------------+ PERO     Full                                                             +---------+---------------+---------+-----------+---------------+-------------+   +---------+---------------+---------+-----------+----------+--------------+  LEFT     CompressibilityPhasicitySpontaneityPropertiesThrombus Aging +---------+---------------+---------+-----------+----------+--------------+ CFV      Full           Yes      Yes                                 +---------+---------------+---------+-----------+----------+--------------+ SFJ      Full                                                        +---------+---------------+---------+-----------+----------+--------------+ FV Prox  Full                                                        +---------+---------------+---------+-----------+----------+--------------+ FV Mid   Full                                                        +---------+---------------+---------+-----------+----------+--------------+ FV DistalFull                                                        +---------+---------------+---------+-----------+----------+--------------+ PFV      Full                                                        +---------+---------------+---------+-----------+----------+--------------+ POP      Full           Yes      Yes                                 +---------+---------------+---------+-----------+----------+--------------+ PTV      Full                                                        +---------+---------------+---------+-----------+----------+--------------+ PERO     Full                                                        +---------+---------------+---------+-----------+----------+--------------+     Summary: RIGHT: - Findings consistent with chronic deep vein thrombosis involving the right femoral vein.  - No cystic structure found in the popliteal fossa.  LEFT: - There is no evidence of deep vein thrombosis in the lower extremity.  - No cystic  structure found  in the popliteal fossa.  *See table(s) above for measurements and observations. Electronically signed by Sherald Hess MD on 04/21/2023 at 3:17:03 PM.    Final    ECHOCARDIOGRAM COMPLETE  Result Date: 04/21/2023    ECHOCARDIOGRAM REPORT   Patient Name:   Peter Diaz Date of Exam: 04/21/2023 Medical Rec #:  [id number]      Height:       68.0 in Accession #:    [id number]     Weight:       169.8 lb Date of Birth:  Apr 20, 1951      BSA:          1.906 m Patient Age:    71 years       BP:           102/66 mmHg Patient Gender: M              HR:           111 bpm. Exam Location:  Inpatient Procedure: 2D Echo, Cardiac Doppler, Color Doppler and Intracardiac            Opacification Agent Indications:    Acute Myocardial Infarction  History:        Patient has no prior history of Echocardiogram examinations.                 Risk Factors:Hypertension and Former Smoker.  Sonographer:    Raeford Razor Sonographer#2:  Irving Burton Senior Referring Phys: [id number] Lorin Glass  Sonographer Comments: Image acquisition challenging due to patient body habitus. IMPRESSIONS  1. Akinesis of the anteroseptal wall and apex with overall severe LV dysfunction.  2. Left ventricular ejection fraction, by estimation, is 25 to 30%. The left ventricle has severely decreased function. The left ventricle demonstrates regional wall motion abnormalities (see scoring diagram/findings for description). Left ventricular diastolic parameters are indeterminate.  3. Right ventricular systolic function is normal. The right ventricular size is normal.  4. A small pericardial effusion is present.  5. The mitral valve is normal in structure. Mild mitral valve regurgitation. No evidence of mitral stenosis.  6. The aortic valve is tricuspid. Aortic valve regurgitation is not visualized. Aortic valve sclerosis is present, with no evidence of aortic valve stenosis.  7. The inferior vena cava is normal in size with greater than 50% respiratory variability,  suggesting right atrial pressure of 3 mmHg. Comparison(s): No prior Echocardiogram. FINDINGS  Left Ventricle: Left ventricular ejection fraction, by estimation, is 25 to 30%. The left ventricle has severely decreased function. The left ventricle demonstrates regional wall motion abnormalities. Definity contrast agent was given IV to delineate the left ventricular endocardial borders. The left ventricular internal cavity size was normal in size. There is no left ventricular hypertrophy. Left ventricular diastolic parameters are indeterminate. Right Ventricle: The right ventricular size is normal. Right ventricular systolic function is normal. Left Atrium: Left atrial size was normal in size. Right Atrium: Right atrial size was normal in size. Pericardium: A small pericardial effusion is present. Mitral Valve: The mitral valve is normal in structure. Mild mitral valve regurgitation. No evidence of mitral valve stenosis. Tricuspid Valve: The tricuspid valve is normal in structure. Tricuspid valve regurgitation is trivial. No evidence of tricuspid stenosis. Aortic Valve: The aortic valve is tricuspid. Aortic valve regurgitation is not visualized. Aortic valve sclerosis is present, with no evidence of aortic valve stenosis. Aortic valve mean gradient measures 2.0 mmHg. Aortic valve peak gradient measures 4.1  mmHg. Aortic valve  area, by VTI measures 3.00 cm. Pulmonic Valve: The pulmonic valve was normal in structure. Pulmonic valve regurgitation is not visualized. No evidence of pulmonic stenosis. Aorta: The aortic root is normal in size and structure. Venous: The inferior vena cava is normal in size with greater than 50% respiratory variability, suggesting right atrial pressure of 3 mmHg. IAS/Shunts: No atrial level shunt detected by color flow Doppler. Additional Comments: Akinesis of the anteroseptal wall and apex with overall severe LV dysfunction.  LEFT VENTRICLE PLAX 2D LVIDd:         5.45 cm      Diastology  LVIDs:         4.80 cm      LV e' medial:    13.40 cm/s LV PW:         0.80 cm      LV E/e' medial:  7.0 LV IVS:        0.95 cm      LV e' lateral:   9.00 cm/s LVOT diam:     2.20 cm      LV E/e' lateral: 10.4 LV SV:         41 LV SV Index:   22 LVOT Area:     3.80 cm  LV Volumes (MOD) LV vol d, MOD A2C: 135.0 ml LV vol d, MOD A4C: 105.0 ml LV vol s, MOD A2C: 97.8 ml LV vol s, MOD A4C: 63.4 ml LV SV MOD A2C:     37.2 ml LV SV MOD A4C:     105.0 ml RIGHT VENTRICLE RV S prime:     7.00 cm/s TAPSE (M-mode): 2.4 cm LEFT ATRIUM           Index LA diam:      3.67 cm 1.93 cm/m LA Vol (A2C): 32.1 ml 16.84 ml/m LA Vol (A4C): 41.9 ml 21.98 ml/m  AORTIC VALVE AV Area (Vmax):    3.12 cm AV Area (Vmean):   2.88 cm AV Area (VTI):     3.00 cm AV Vmax:           101.00 cm/s AV Vmean:          70.600 cm/s AV VTI:            0.138 m AV Peak Grad:      4.1 mmHg AV Mean Grad:      2.0 mmHg LVOT Vmax:         82.90 cm/s LVOT Vmean:        53.400 cm/s LVOT VTI:          0.109 m LVOT/AV VTI ratio: 0.79  AORTA Ao Root diam: 3.40 cm Ao Asc diam:  3.50 cm MITRAL VALVE MV Area (PHT): 10.92 cm      SHUNTS MV Area VTI:   0.51 cm       Systemic VTI:  0.11 m MV VTI:        0.82 m         Systemic Diam: 2.20 cm MV Decel Time: 70 msec MR Peak grad:    64.0 mmHg MR Vmax:         400.00 cm/s MR PISA Nyquist: 0.3 m/s MR PISA:         3.08 cm MR PISA Radius:  0.70 cm MV E velocity: 93.40 cm/s MV A velocity: 912.00 cm/s MV E/A ratio:  0.10 Olga Millers MD Electronically signed by Olga Millers MD Signature Date/Time: 04/21/2023/1:26:34 PM    Final  Cardiac Studies   Echo IMPRESSIONS     1. Akinesis of the anteroseptal wall and apex with overall severe LV  dysfunction.   2. Left ventricular ejection fraction, by estimation, is 25 to 30%. The  left ventricle has severely decreased function. The left ventricle  demonstrates regional wall motion abnormalities (see scoring  diagram/findings for description). Left ventricular   diastolic parameters are indeterminate.   3. Right ventricular systolic function is normal. The right ventricular  size is normal.   4. A small pericardial effusion is present.   5. The mitral valve is normal in structure. Mild mitral valve  regurgitation. No evidence of mitral stenosis.   6. The aortic valve is tricuspid. Aortic valve regurgitation is not  visualized. Aortic valve sclerosis is present, with no evidence of aortic  valve stenosis.   7. The inferior vena cava is normal in size with greater than 50%  respiratory variability, suggesting right atrial pressure of 3 mmHg.   Comparison(s): No prior Echocardiogram.   Patient Profile     72 y.o. male with T4N2M0 nonsmall cell lung CA. Presents with acute posterior stroke and ASMI  Assessment & Plan    Acute anteroseptal infarct. Onset unclear as patient never had chest pain. Troponin >24K. Ecg with ST elevation in anteroseptal leads with Q waves 1, Avl, V1-4. Suspect infarct completed. Echo with EF 25-30% with large area of anterior and apical infarct. Medical therapy limited by low BP. Now unable to anticoagulate due to GI bleed.not a candidate for invasive cardiac evaluation given poor prognosis from standpoint of lung CA and stroke. Really little to offer at this point. Family has decided to treat with comfort care only which I feel is appropriate.  Acute systolic CHF. EF 25-30% secondary to ischemic CM. Unable to tolerate GDMT due to hypotension and AKI. Acute posterior stroke. No hemorrhage. Per Neuro Non small cell lung CA. S/p chemoradiation. Had CT chest on 5/10 - no metastases or growth compared to prior.  History of HTN. Now with low BP. Antihypertensives on hold.  Acute kidney injury. Renal US normal. No obstruction. Did have contrast CT on 5/10.    [facility] will sign off.   Medication Recommendations:  none Other recommendations (labs, testing, etc):  none Follow up as an outpatient:  PRN       For  questions or updates, please contact  [facility] Please consult [email or web address] for contact info under        Signed, Allante Whitmire [place], MD  04/23/2023, 10:21 AM

## 2023-04-24 DIAGNOSIS — Z515 Encounter for palliative care: Secondary | ICD-10-CM

## 2023-04-24 DIAGNOSIS — I213 ST elevation (STEMI) myocardial infarction of unspecified site: Secondary | ICD-10-CM | POA: Diagnosis not present

## 2023-04-24 LAB — CULTURE, BLOOD (ROUTINE X 2)
Culture: NO GROWTH
Culture: NO GROWTH

## 2023-04-24 MED ORDER — ATROPINE SULFATE 1 % OP SOLN: 2.0000 [drp] | Freq: Three times a day (TID) | OPHTHALMIC | 12 refills | Status: DC | PRN

## 2023-04-24 MED ORDER — DIPHENOXYLATE-ATROPINE 2.5-0.025 MG PO TABS: 1.0000 | ORAL_TABLET | Freq: Four times a day (QID) | ORAL | 1 refills | Status: DC | PRN

## 2023-04-24 MED ORDER — LORAZEPAM 0.5 MG PO TABS: 0.5000 mg | ORAL_TABLET | Freq: Three times a day (TID) | ORAL | 0 refills | Status: DC | PRN

## 2023-04-24 MED ORDER — LOPERAMIDE HCL 2 MG PO TABS: 2.0000 mg | ORAL_TABLET | Freq: Two times a day (BID) | ORAL | 0 refills | Status: DC

## 2023-04-24 MED ORDER — GERHARDT'S BUTT CREAM: 1.0000 | TOPICAL_CREAM | CUTANEOUS | 0 refills | Status: DC | PRN

## 2023-04-24 MED ORDER — OXYCODONE HCL 5 MG PO TABS: 5.0000 mg | ORAL_TABLET | Freq: Four times a day (QID) | ORAL | 0 refills | Status: DC | PRN

## 2023-04-24 NOTE — Plan of Care (Signed)

## 2023-04-24 NOTE — Progress Notes (Signed)
Rectal tube removed per MD order. AVS reviewed with pt.'s wife who voices understanding. Pt. To be discharged home on hospice.

## 2023-04-24 NOTE — Discharge Summary (Addendum)
Physician Discharge Summary   Patient: Peter Diaz MRN: [id number] DOB: 1951/01/24  Admit date:     04/20/2023  Discharge date: 04/24/23  Discharge Physician: Lynden Oxford  PCP: [name], MD  Recommendations at discharge:  Establish care with hospice  Discharge Diagnoses: Principal Problem:   STEMI (ST elevation myocardial infarction) [facility]) Active Problems:   Cerebrovascular accident (CVA) due to occlusion of left posterior cerebral artery (HCC)   Acute respiratory failure with hypoxia (HCC)   Encephalopathy acute   Gastrointestinal hemorrhage with melena   Hospice care patient  Hospital Course: PMH of stage IIIb lung cancer, prior left frontal lobe abscess SP evacuation, HTN present to the hospital with confusion. Was found to have STEMI as well as acute CVA. Admitted to the ICU.  Cardiology and neurology both were consulted.  Code STEMI was called by ED but patient did not underwent cardiac catheterization given his presentation with acute stroke.  Developed melena during the hospitalization as well as AKI complicating management with family recently transition to complete comfort. Assessment and Plan  Acute anteroseptal infarct STEMI with acute systolic HF Was on aspirin and Plavix. Was also on IV heparin. Developed GI bleed and therefore all medications are now on hold. Currently comfort care.   Echocardiogram showed EF of 25 to 30%. Treated with wall motion abnormality seen as well as mild MR seen. Not a candidate for GDMT given his anemia. Cardiology was consulted. Conservative management. Comfort care.   Acute L PCA territory infarct  L P4 occlusion  R P2 moderate stenosis  R frontal lobe R occipital lobe acute punctate ischemia  Presented with confusion. CT of the head showed evidence of acute CVA. Felt either 2/2 to hypercoagulable state from advanced NSCLC vs cardioembolic from STEMI/ low EF cardiomyopathy   Neurology was consulted. Recommendation was  for Eliquis per oncology/ neurology discussion after 48hrs of IV heparin, with ASA PT OT also recommended CIR. Now with GI bleed. Currently comfort care.   Normocytic anemia/acute blood loss anemia Melena, most likely upper GI bleed Diarrhea Baseline hemoglobin around 13. Current hemoglobin around 10. Developed melena with diarrhea initially started on PPI twice daily. CT a/p neg, clinical exam benign After discussion with the family patient currently comfort care.   Stg IIIb NSCLC LUL (dx 01/2022) Possible Postobstructive PNA L pleural effusion Acute hypoxic respiratory failure Dysphagia   on imfinzi q4wk -- s/p 12 cycles.Final (13th) cycled dc due to immunotherapy mediated rash for which he has been on high dose steroid taper, intolerant of immunotherapy with gradual functional decline Speech therapy was consulted.  Recommended dysphagia 3 diet. Now comfort care.   Acute metabolic encephalopathy. Continue delirium precaution.   Acute kidney injury. Likely prerenal in the setting of diarrhea. Also hypertension causing ischemic injury. Ultrasound renal normal. Continue foley on discharge for comfort.  Monitor.   Goals of care conversation. Palliative care was consulted. PCCM discussed in detail with regards to goals of care with the family. Currently transitioning to complete comfort.  Plan is to go home with hospice.   Consultants:  Palliative care  PCCM  Cardiology  Procedures performed:  Echocardiogram   DISCHARGE MEDICATION: Allergies as of 04/24/2023       Reactions   Marinol [dronabinol] Other (See Comments)   "Feels bloated. I don' t ever want to take it "        Medication List     STOP taking these medications    albuterol 108 (90 Base) MCG/ACT inhaler Commonly  known as: VENTOLIN HFA   amLODipine 10 MG tablet Commonly known as: NORVASC   esomeprazole 20 MG capsule Commonly known as: NEXIUM   ibuprofen 200 MG tablet Commonly known as:  ADVIL   predniSONE 10 MG tablet Commonly known as: DELTASONE       TAKE these medications    atropine 1 % ophthalmic solution Place 2 drops under the tongue 3 (three) times daily as needed (secretion).   diphenoxylate-atropine 2.5-0.025 MG tablet Commonly known as: Lomotil Take 1 tablet by mouth 4 (four) times daily as needed for diarrhea or loose stools.   Gerhardt's butt cream Crea Apply 1 Application topically as needed for irritation.   loperamide 2 MG tablet Commonly known as: Imodium A-D Take 1 tablet (2 mg total) by mouth in the morning and at bedtime. Stop if no stool in a day   LORazepam 0.5 MG tablet Commonly known as: Ativan Take 1 tablet (0.5 mg total) by mouth every 8 (eight) hours as needed for anxiety.   mirtazapine 15 MG tablet Commonly known as: REMERON TAKE 1 TABLET(15 MG) BY MOUTH AT BEDTIME What changed: See the new instructions.   oxyCODONE 5 MG immediate release tablet Commonly known as: Oxy IR/ROXICODONE Take 1 tablet (5 mg total) by mouth every 6 (six) hours as needed for severe pain or moderate pain (shortness of rbeat).   tamsulosin 0.4 MG Caps capsule Commonly known as: FLOMAX Take 0.4 mg by mouth at bedtime.       Disposition: home with hospcie Diet recommendation: Regular diet  Discharge Exam: Vitals:   04/22/23 2117 04/23/23 0754 04/23/23 2044 04/24/23 0822  BP: (!) 82/55 (!) 148/92 (!) 91/55 98/65  Pulse: (!) 45 (!) 59 (!) 105 98  Resp: 20 16  17   Temp: 99.1 F (37.3 C) 97.7 F (36.5 C) 99.4 F (37.4 C) 98 F (36.7 C)  TempSrc: Oral Oral Oral Oral  SpO2: (!) 87% (!) 88% 94% 94%  Weight:      Height:       General: Appear in mild distress; no visible Abnormal Neck Mass Or lumps, Conjunctiva normal Cardiovascular: S1 and S2 Present, no Murmur, Respiratory: good respiratory effort, Bilateral Air entry present and CTA, no Crackles, no wheezes Abdomen: Bowel Sound present, Non tender  Extremities: no Pedal edema Neurology:  alert and oriented to self  Filed Weights   04/21/23 0500 04/21/23 0800 04/22/23 0500  Weight: 77 kg 77 kg 77.5 kg   Condition at discharge: stable  The results of significant diagnostics from this hospitalization (including imaging, microbiology, ancillary and laboratory) are listed below for reference.   Imaging Studies: CT ABDOMEN PELVIS WO CONTRAST  Result Date: 04/21/2023 CLINICAL DATA:  Non-small cell lung cancer staging. EXAM: CT ABDOMEN AND PELVIS WITHOUT CONTRAST TECHNIQUE: Multidetector CT imaging of the abdomen and pelvis was performed following the standard protocol without IV contrast. RADIATION DOSE REDUCTION: This exam was performed according to the departmental dose-optimization program which includes automated exposure control, adjustment of the mA and/or kV according to patient size and/or use of iterative reconstruction technique. COMPARISON:  PET-CT 01/08/2022 FINDINGS: Lower chest: Small right and moderate left pleural effusions are partially visualized. Hepatobiliary: Gallstones are present. There is no biliary ductal dilatation. The liver is within normal limits. Pancreas: There is a 2.6 x 1.7 cm low-attenuation area abutting and anterior to the pancreatic head image 3/37, likely cystic. Otherwise, the pancreas is within normal limits. Spleen: Normal in size without focal abnormality. Adrenals/Urinary Tract: Adrenal glands are  unremarkable. Kidneys are normal, without renal calculi, focal lesion, or hydronephrosis. Bladder is unremarkable. Stomach/Bowel: Stomach is within normal limits. Appendix appears normal. No evidence of bowel wall thickening, distention, or inflammatory changes. Vascular/Lymphatic: Aortic atherosclerosis. No enlarged abdominal or pelvic lymph nodes. Reproductive: Prostate gland is enlarged. Other: There is a small fat containing right inguinal hernia. There is no ascites. Musculoskeletal: No acute or significant osseous findings. IMPRESSION: 1. No evidence  for metastatic disease in the abdomen or pelvis. 2. Moderate left and small right pleural effusions. 3. Cholelithiasis. 4. 2.6 cm low-attenuation area abutting and anterior to the pancreatic head, likely cystic. Recommend further evaluation with MRI. Aortic Atherosclerosis (ICD10-I70.0). Electronically Signed   By: Darliss Cheney M.D.   On: 04/21/2023 21:26   VAS [place] LOWER EXTREMITY VENOUS (DVT)  Result Date: 04/21/2023  Lower Venous DVT Study Patient Name:  TEJVEER MOTA  Date of Exam:   04/21/2023 Medical Rec #: [id number]       Accession #:    [id number] Date of Birth: [birth year]       Patient Gender: M Patient Age:   29 years Exam Location:  [facility] Procedure:      VAS [place] LOWER EXTREMITY VENOUS (DVT) Referring Phys: Levon Hedger --------------------------------------------------------------------------------  Indications: Stroke. Embolic.  Risk Factors: Cancer - lung. Comparison Study: No prior studies. Performing Technologist: Jean Rosenthal RDMS, RVT  Examination Guidelines: A complete evaluation includes B-mode imaging, spectral Doppler, color Doppler, and power Doppler as needed of all accessible portions of each vessel. Bilateral testing is considered an integral part of a complete examination. Limited examinations for reoccurring indications may be performed as noted. The reflux portion of the exam is performed with the patient in reverse Trendelenburg.  +---------+---------------+---------+-----------+---------------+-------------+ RIGHT    CompressibilityPhasicitySpontaneityProperties     Thrombus                                                                 Aging         +---------+---------------+---------+-----------+---------------+-------------+ CFV      Full           Yes      Yes                                     +---------+---------------+---------+-----------+---------------+-------------+ SFJ      Full                                                             +---------+---------------+---------+-----------+---------------+-------------+ FV Prox  Full                                                            +---------+---------------+---------+-----------+---------------+-------------+ FV Mid   Partial        Yes      Yes  brightly       Chronic                                                   echogenic                    +---------+---------------+---------+-----------+---------------+-------------+ FV DistalFull                                                            +---------+---------------+---------+-----------+---------------+-------------+ PFV      Full                                                            +---------+---------------+---------+-----------+---------------+-------------+ POP      Full           Yes      Yes                                     +---------+---------------+---------+-----------+---------------+-------------+ PTV      Full                                                            +---------+---------------+---------+-----------+---------------+-------------+ PERO     Full                                                            +---------+---------------+---------+-----------+---------------+-------------+   +---------+---------------+---------+-----------+----------+--------------+ LEFT     CompressibilityPhasicitySpontaneityPropertiesThrombus Aging +---------+---------------+---------+-----------+----------+--------------+ CFV      Full           Yes      Yes                                 +---------+---------------+---------+-----------+----------+--------------+ SFJ      Full                                                        +---------+---------------+---------+-----------+----------+--------------+ FV Prox  Full                                                         +---------+---------------+---------+-----------+----------+--------------+ FV Mid   Full                                                        +---------+---------------+---------+-----------+----------+--------------+  FV DistalFull                                                        +---------+---------------+---------+-----------+----------+--------------+ PFV      Full                                                        +---------+---------------+---------+-----------+----------+--------------+ POP      Full           Yes      Yes                                 +---------+---------------+---------+-----------+----------+--------------+ PTV      Full                                                        +---------+---------------+---------+-----------+----------+--------------+ PERO     Full                                                        +---------+---------------+---------+-----------+----------+--------------+     Summary: RIGHT: - Findings consistent with chronic deep vein thrombosis involving the right femoral vein.  - No cystic structure found in the popliteal fossa.  LEFT: - There is no evidence of deep vein thrombosis in the lower extremity.  - No cystic structure found in the popliteal fossa.  *See table(s) above for measurements and observations. Electronically signed by Sherald Hess MD on 04/21/2023 at 3:17:03 PM.    Final    CT Chest W Contrast  Result Date: 04/21/2023 CLINICAL DATA:  Non-small cell lung cancer, assess treatment response. * Tracking Code: BO * EXAM: CT CHEST WITH CONTRAST TECHNIQUE: Multidetector CT imaging of the chest was performed during intravenous contrast administration. RADIATION DOSE REDUCTION: This exam was performed according to the departmental dose-optimization program which includes automated exposure control, adjustment of the mA and/or kV according to patient size and/or use of iterative reconstruction  technique. CONTRAST:  75mL OMNIPAQUE IOHEXOL 300 MG/ML  SOLN COMPARISON:  11/25/2022 and 12/14/2021. FINDINGS: Cardiovascular: Atherosclerotic calcification of the aorta, aortic valve and coronary arteries. Enlarged pulmonic trunk and heart. No pericardial effusion. Mediastinum/Nodes: Soft tissue thickening in the left hilum, as before. No pathologically enlarged mediastinal, right hilar or axillary lymph nodes. Esophagus is grossly unremarkable. Lungs/Pleura: Centrilobular and paraseptal emphysema. Amorphous subpleural ground-glass in the posterior segment right upper lobe (6/50), unchanged and nonspecific. Post treatment parenchymal consolidation and retraction in the perihilar left upper and left lower lobes, unchanged. Partially loculated moderate left pleural effusion with pleural thickening, grossly similar in size. Narrowing of the left lower lobe bronchus, unchanged. Airway is otherwise unremarkable. Upper Abdomen: Visualized portion of the liver is unremarkable. Gallstone. Adrenal glands are unremarkable. Subcentimeter low-attenuation lesions in the right kidney, too small  to characterize. No specific follow-up necessary. Visualized portions of the kidneys and spleen are otherwise unremarkable. Cystic lesion in the head and uncinate process of the pancreas, roughly measuring 1.7 x 2.7 cm, similar to baseline 12/14/2021. No ductal dilatation or gland atrophy. Stomach is grossly unremarkable. Duodenal diverticulum. No upper abdominal adenopathy. Musculoskeletal: Degenerative changes in the spine. No worrisome lytic or sclerotic lesions. IMPRESSION: 1. Post treatment consolidation and retraction in the left upper and left lower lobes. No evidence of recurrent or metastatic disease. 2. Moderate left fibrothorax, similar. 3. Cholelithiasis. 4. Cystic lesion in the head of the pancreas, similar to baseline 12/14/2021. CT abdomen without and with contrast in 6 months is recommended, as clinically indicated, as a  cystic pancreatic neoplasm cannot be excluded. This recommendation follows ACR consensus guidelines: Management of Incidental Pancreatic Cysts: A White Paper of the ACR Incidental Findings Committee. J Am Coll Radiol 2017;14:911-923. 5. Aortic atherosclerosis (ICD10-I70.0). Coronary artery calcification. 6. Enlarged pulmonic trunk, indicative of pulmonary arterial hypertension. 7.  Emphysema (ICD10-J43.9). Electronically Signed   By: Leanna Battles M.D.   On: 04/21/2023 13:37   ECHOCARDIOGRAM COMPLETE  Result Date: 04/21/2023    ECHOCARDIOGRAM REPORT   Patient Name:   JOSHUAN GIAIMO Date of Exam: 04/21/2023 Medical Rec #:  [id number]      Height:       68.0 in Accession #:    [id number]     Weight:       169.8 lb Date of Birth:  [birth year]      BSA:          1.906 m Patient Age:    71 years       BP:           102/66 mmHg Patient Gender: M              HR:           111 bpm. Exam Location:  Inpatient Procedure: 2D Echo, Cardiac Doppler, Color Doppler and Intracardiac            Opacification Agent Indications:    Acute Myocardial Infarction  History:        Patient has no prior history of Echocardiogram examinations.                 Risk Factors:Hypertension and Former Smoker.  Sonographer:    Raeford Razor Sonographer#2:  Irving Burton Senior Referring Phys: [id number] Lorin Glass  Sonographer Comments: Image acquisition challenging due to patient body habitus. IMPRESSIONS  1. Akinesis of the anteroseptal wall and apex with overall severe LV dysfunction.  2. Left ventricular ejection fraction, by estimation, is 25 to 30%. The left ventricle has severely decreased function. The left ventricle demonstrates regional wall motion abnormalities (see scoring diagram/findings for description). Left ventricular diastolic parameters are indeterminate.  3. Right ventricular systolic function is normal. The right ventricular size is normal.  4. A small pericardial effusion is present.  5. The mitral valve is normal in structure. Mild  mitral valve regurgitation. No evidence of mitral stenosis.  6. The aortic valve is tricuspid. Aortic valve regurgitation is not visualized. Aortic valve sclerosis is present, with no evidence of aortic valve stenosis.  7. The inferior vena cava is normal in size with greater than 50% respiratory variability, suggesting right atrial pressure of 3 mmHg. Comparison(s): No prior Echocardiogram. FINDINGS  Left Ventricle: Left ventricular ejection fraction, by estimation, is 25 to 30%. The left ventricle has severely decreased function. The left ventricle demonstrates  regional wall motion abnormalities. Definity contrast agent was given IV to delineate the left ventricular endocardial borders. The left ventricular internal cavity size was normal in size. There is no left ventricular hypertrophy. Left ventricular diastolic parameters are indeterminate. Right Ventricle: The right ventricular size is normal. Right ventricular systolic function is normal. Left Atrium: Left atrial size was normal in size. Right Atrium: Right atrial size was normal in size. Pericardium: A small pericardial effusion is present. Mitral Valve: The mitral valve is normal in structure. Mild mitral valve regurgitation. No evidence of mitral valve stenosis. Tricuspid Valve: The tricuspid valve is normal in structure. Tricuspid valve regurgitation is trivial. No evidence of tricuspid stenosis. Aortic Valve: The aortic valve is tricuspid. Aortic valve regurgitation is not visualized. Aortic valve sclerosis is present, with no evidence of aortic valve stenosis. Aortic valve mean gradient measures 2.0 mmHg. Aortic valve peak gradient measures 4.1  mmHg. Aortic valve area, by VTI measures 3.00 cm. Pulmonic Valve: The pulmonic valve was normal in structure. Pulmonic valve regurgitation is not visualized. No evidence of pulmonic stenosis. Aorta: The aortic root is normal in size and structure. Venous: The inferior vena cava is normal in size with greater  than 50% respiratory variability, suggesting right atrial pressure of 3 mmHg. IAS/Shunts: No atrial level shunt detected by color flow Doppler. Additional Comments: Akinesis of the anteroseptal wall and apex with overall severe LV dysfunction.  LEFT VENTRICLE PLAX 2D LVIDd:         5.45 cm      Diastology LVIDs:         4.80 cm      LV e' medial:    13.40 cm/s LV PW:         0.80 cm      LV E/e' medial:  7.0 LV IVS:        0.95 cm      LV e' lateral:   9.00 cm/s LVOT diam:     2.20 cm      LV E/e' lateral: 10.4 LV SV:         41 LV SV Index:   22 LVOT Area:     3.80 cm  LV Volumes (MOD) LV vol d, MOD A2C: 135.0 ml LV vol d, MOD A4C: 105.0 ml LV vol s, MOD A2C: 97.8 ml LV vol s, MOD A4C: 63.4 ml LV SV MOD A2C:     37.2 ml LV SV MOD A4C:     105.0 ml RIGHT VENTRICLE RV S prime:     7.00 cm/s TAPSE (M-mode): 2.4 cm LEFT ATRIUM           Index LA diam:      3.67 cm 1.93 cm/m LA Vol (A2C): 32.1 ml 16.84 ml/m LA Vol (A4C): 41.9 ml 21.98 ml/m  AORTIC VALVE AV Area (Vmax):    3.12 cm AV Area (Vmean):   2.88 cm AV Area (VTI):     3.00 cm AV Vmax:           101.00 cm/s AV Vmean:          70.600 cm/s AV VTI:            0.138 m AV Peak Grad:      4.1 mmHg AV Mean Grad:      2.0 mmHg LVOT Vmax:         82.90 cm/s LVOT Vmean:        53.400 cm/s LVOT VTI:  0.109 m LVOT/AV VTI ratio: 0.79  AORTA Ao Root diam: 3.40 cm Ao Asc diam:  3.50 cm MITRAL VALVE MV Area (PHT): 10.92 cm      SHUNTS MV Area VTI:   0.51 cm       Systemic VTI:  0.11 m MV VTI:        0.82 m         Systemic Diam: 2.20 cm MV Decel Time: 70 msec MR Peak grad:    64.0 mmHg MR Vmax:         400.00 cm/s MR PISA Nyquist: 0.3 m/s MR PISA:         3.08 cm MR PISA Radius:  0.70 cm MV E velocity: 93.40 cm/s MV A velocity: 912.00 cm/s MV E/A ratio:  0.10 Olga Millers MD Electronically signed by Olga Millers MD Signature Date/Time: 04/21/2023/1:26:34 PM    Final    MR BRAIN W WO CONTRAST  Result Date: 04/21/2023 CLINICAL DATA:  Acute neurologic  deficit EXAM: MRI HEAD WITHOUT AND WITH CONTRAST MRA HEAD WITHOUT CONTRAST MRA NECK WITHOUT CONTRAST TECHNIQUE: Multiplanar, multiecho pulse sequences of the brain and surrounding structures were obtained without and with intravenous contrast. Angiographic images of the Circle of Willis were obtained using MRA technique without intravenous contrast. Angiographic images of the neck were obtained using MRA technique without intravenous contrast. Carotid stenosis measurements (when applicable) are obtained utilizing NASCET criteria, using the distal internal carotid diameter as the denominator. CONTRAST:  7.17mL GADAVIST GADOBUTROL 1 MMOL/ML IV SOLN COMPARISON:  None Available. FINDINGS: MRI HEAD FINDINGS Brain: Acute infarct of the left PCA territory involving the left occipital and medial temporal lobes. Multiple punctate foci of acute ischemia within the right frontal lobe and right occipital lobe. Old left frontal and right parietal infarcts. There is multifocal hyperintense T2-weighted signal within the white matter. Generalized volume loss. The midline structures are normal. There is no abnormal contrast enhancement. Vascular: Major flow voids are preserved. Skull and upper cervical spine: Normal calvarium and skull base. Visualized upper cervical spine and soft tissues are normal. Sinuses/Orbits:Opacified sphenoid sinuses.  Normal orbits. MRA HEAD FINDINGS POSTERIOR CIRCULATION: --Vertebral arteries: Normal --Inferior cerebellar arteries: Normal. --Basilar artery: Normal. --Superior cerebellar arteries: Normal. --Posterior cerebral arteries: Moderate stenosis of the distal P2 segment of the right PCA. Occlusion of the left PCA P4 segment. ANTERIOR CIRCULATION: --Intracranial internal carotid arteries: Normal. --Anterior cerebral arteries (ACA): Normal. --Middle cerebral arteries (MCA): Normal. MRA NECK FINDINGS Motion degraded time-of-flight imaging of the carotid and vertebral systems. Postcontrast imaging could  not be obtained due to software failure. The visible portions of the carotid and vertebral arteries are normal. The proximal vessels are not visualized. IMPRESSION: 1. Acute infarct of the left PCA territory involving the left occipital and medial temporal lobes. No hemorrhage or mass effect. 2. Multiple punctate foci of acute ischemia within the right frontal lobe and right occipital lobe. 3. Occlusion of the left PCA P4 segment. Moderate stenosis of the distal P2 segment of the right PCA. 4. No visible occlusion in the neck. Electronically Signed   By: Deatra Robinson M.D.   On: 04/21/2023 01:33   MR ANGIO HEAD WO CONTRAST  Result Date: 04/21/2023 CLINICAL DATA:  Acute neurologic deficit EXAM: MRI HEAD WITHOUT AND WITH CONTRAST MRA HEAD WITHOUT CONTRAST MRA NECK WITHOUT CONTRAST TECHNIQUE: Multiplanar, multiecho pulse sequences of the brain and surrounding structures were obtained without and with intravenous contrast. Angiographic images of the Circle of Willis were obtained using MRA technique  without intravenous contrast. Angiographic images of the neck were obtained using MRA technique without intravenous contrast. Carotid stenosis measurements (when applicable) are obtained utilizing NASCET criteria, using the distal internal carotid diameter as the denominator. CONTRAST:  7.41mL GADAVIST GADOBUTROL 1 MMOL/ML IV SOLN COMPARISON:  None Available. FINDINGS: MRI HEAD FINDINGS Brain: Acute infarct of the left PCA territory involving the left occipital and medial temporal lobes. Multiple punctate foci of acute ischemia within the right frontal lobe and right occipital lobe. Old left frontal and right parietal infarcts. There is multifocal hyperintense T2-weighted signal within the white matter. Generalized volume loss. The midline structures are normal. There is no abnormal contrast enhancement. Vascular: Major flow voids are preserved. Skull and upper cervical spine: Normal calvarium and skull base. Visualized  upper cervical spine and soft tissues are normal. Sinuses/Orbits:Opacified sphenoid sinuses.  Normal orbits. MRA HEAD FINDINGS POSTERIOR CIRCULATION: --Vertebral arteries: Normal --Inferior cerebellar arteries: Normal. --Basilar artery: Normal. --Superior cerebellar arteries: Normal. --Posterior cerebral arteries: Moderate stenosis of the distal P2 segment of the right PCA. Occlusion of the left PCA P4 segment. ANTERIOR CIRCULATION: --Intracranial internal carotid arteries: Normal. --Anterior cerebral arteries (ACA): Normal. --Middle cerebral arteries (MCA): Normal. MRA NECK FINDINGS Motion degraded time-of-flight imaging of the carotid and vertebral systems. Postcontrast imaging could not be obtained due to software failure. The visible portions of the carotid and vertebral arteries are normal. The proximal vessels are not visualized. IMPRESSION: 1. Acute infarct of the left PCA territory involving the left occipital and medial temporal lobes. No hemorrhage or mass effect. 2. Multiple punctate foci of acute ischemia within the right frontal lobe and right occipital lobe. 3. Occlusion of the left PCA P4 segment. Moderate stenosis of the distal P2 segment of the right PCA. 4. No visible occlusion in the neck. Electronically Signed   By: Deatra Robinson M.D.   On: 04/21/2023 01:33   MR ANGIO NECK W WO CONTRAST  Result Date: 04/21/2023 CLINICAL DATA:  Acute neurologic deficit EXAM: MRI HEAD WITHOUT AND WITH CONTRAST MRA HEAD WITHOUT CONTRAST MRA NECK WITHOUT CONTRAST TECHNIQUE: Multiplanar, multiecho pulse sequences of the brain and surrounding structures were obtained without and with intravenous contrast. Angiographic images of the Circle of Willis were obtained using MRA technique without intravenous contrast. Angiographic images of the neck were obtained using MRA technique without intravenous contrast. Carotid stenosis measurements (when applicable) are obtained utilizing NASCET criteria, using the distal  internal carotid diameter as the denominator. CONTRAST:  7.23mL GADAVIST GADOBUTROL 1 MMOL/ML IV SOLN COMPARISON:  None Available. FINDINGS: MRI HEAD FINDINGS Brain: Acute infarct of the left PCA territory involving the left occipital and medial temporal lobes. Multiple punctate foci of acute ischemia within the right frontal lobe and right occipital lobe. Old left frontal and right parietal infarcts. There is multifocal hyperintense T2-weighted signal within the white matter. Generalized volume loss. The midline structures are normal. There is no abnormal contrast enhancement. Vascular: Major flow voids are preserved. Skull and upper cervical spine: Normal calvarium and skull base. Visualized upper cervical spine and soft tissues are normal. Sinuses/Orbits:Opacified sphenoid sinuses.  Normal orbits. MRA HEAD FINDINGS POSTERIOR CIRCULATION: --Vertebral arteries: Normal --Inferior cerebellar arteries: Normal. --Basilar artery: Normal. --Superior cerebellar arteries: Normal. --Posterior cerebral arteries: Moderate stenosis of the distal P2 segment of the right PCA. Occlusion of the left PCA P4 segment. ANTERIOR CIRCULATION: --Intracranial internal carotid arteries: Normal. --Anterior cerebral arteries (ACA): Normal. --Middle cerebral arteries (MCA): Normal. MRA NECK FINDINGS Motion degraded time-of-flight imaging of the carotid and vertebral systems.  Postcontrast imaging could not be obtained due to software failure. The visible portions of the carotid and vertebral arteries are normal. The proximal vessels are not visualized. IMPRESSION: 1. Acute infarct of the left PCA territory involving the left occipital and medial temporal lobes. No hemorrhage or mass effect. 2. Multiple punctate foci of acute ischemia within the right frontal lobe and right occipital lobe. 3. Occlusion of the left PCA P4 segment. Moderate stenosis of the distal P2 segment of the right PCA. 4. No visible occlusion in the neck. Electronically  Signed   By: Deatra Robinson M.D.   On: 04/21/2023 01:33   US RENAL  Result Date: 04/20/2023 CLINICAL DATA:  Acute kidney injury. EXAM: RENAL / URINARY TRACT ULTRASOUND COMPLETE COMPARISON:  PET-CT 01/08/2022 FINDINGS: Right Kidney: Renal measurements: 10.6 x 4.7 x 4.1 cm = volume: 106 mL. Echogenicity within normal limits. No mass or hydronephrosis visualized. Left Kidney: Renal measurements: 9.4 x 4.7 x 5.0 = volume: 116 mL. Echogenicity within normal limits. No mass or hydronephrosis visualized. Bladder: Urinary bladder was empty and not well visualized. Other: None. IMPRESSION: Normal sonographic evaluation of the kidneys. Urinary bladder was empty and not well visualized. Electronically Signed   By: Sherron Ales M.D.   On: 04/20/2023 19:27   DG Chest Port 1 View  Result Date: 04/20/2023 CLINICAL DATA:  Altered mental status EXAM: PORTABLE CHEST 1 VIEW COMPARISON:  Chest x-ray January 18 2022. Chest CT November 25, 2022 and Apr 18, 2023 FINDINGS: A left pleural effusion is identified. Opacity is identified in the lingula/lower lobe better appreciated on the Apr 18, 2023 CT and probably similar in the interval. This finding is increased compared to the comparison chest x-ray. The right lung remains clear. The cardiomediastinal silhouette is stable. No pneumothorax. IMPRESSION: 1. Opacity in the lingula/lower lobe has increased compared to the prior chest x-ray. This finding is better appreciated on the Apr 18, 2023 CT and probably similar in the interval. 2. A left pleural effusion is identified. Electronically Signed   By: Gerome Sam III M.D.   On: 04/20/2023 17:49   CT Head Wo Contrast  Result Date: 04/20/2023 CLINICAL DATA:  Mental status change. EXAM: CT HEAD WITHOUT CONTRAST TECHNIQUE: Contiguous axial images were obtained from the base of the skull through the vertex without intravenous contrast. RADIATION DOSE REDUCTION: This exam was performed according to the departmental  dose-optimization program which includes automated exposure control, adjustment of the mA and/or kV according to patient size and/or use of iterative reconstruction technique. COMPARISON:  Brain MRI 01/22/2022 FINDINGS: Brain: Within the medial LEFT occipital lobe there is a geographic region of low cortical density along the posterior falx suggesting a LEFT posterior cerebral arterial infarction. Infarcted region measures 5.7 by 2.5 by 2.5 cm (volume = 19 cm^3) Regional gliosis and encephalomalacia in the LEFT frontal lobe is unchanged. No intracranial hemorrhage.  No midline shift or mass effect. Vascular: No hyperdense vessel or unexpected calcification. Skull: Normal. Negative for fracture or focal lesion. Sinuses/Orbits: Extensive thickening of the maxillary sinus walls. There is evidence of prior ethmoid surgery. Mucosal thickening and fluid levels within the paranasal sinuses. Mucosal thickening extends in the frontal sinus Other: None IMPRESSION: 1. Acute or subacute infarction in the LEFT posterior cerebral artery territory (medial LEFT occipital lobe). 2. No intracranial hemorrhage. 3. Chronic LEFT frontal lobe infarction. 4. Acute on chronic sinusitis. Electronically Signed   By: Genevive Bi M.D.   On: 04/20/2023 16:00    Microbiology: Results for  orders placed or performed during the hospital encounter of 04/20/23  MRSA Next Gen by PCR, Nasal     Status: None   Collection Time: 04/20/23  9:19 PM   Specimen: Nasal Mucosa; Nasal Swab  Result Value Ref Range Status   MRSA by PCR Next Gen NOT DETECTED NOT DETECTED Final    Comment: (NOTE) The GeneXpert MRSA Assay (FDA approved for NASAL specimens only), is one component of a comprehensive MRSA colonization surveillance program. It is not intended to diagnose MRSA infection nor to guide or monitor treatment for MRSA infections. Test performance is not FDA approved in patients less than 47 years old. Performed at [facility],  [address], [place], [place] [address]   Blood Culture (routine x 2)     Status: None (Preliminary result)   Collection Time: 04/20/23  9:50 PM   Specimen: BLOOD RIGHT HAND  Result Value Ref Range Status   Specimen Description BLOOD RIGHT HAND  Final   Special Requests   Final    BOTTLES DRAWN AEROBIC AND ANAEROBIC Blood Culture adequate volume   Culture   Final    NO GROWTH 4 DAYS Performed at [facility], [address] [place], [place] [address]    Report Status PENDING  Incomplete  Blood Culture (routine x 2)     Status: None (Preliminary result)   Collection Time: 04/20/23  9:50 PM   Specimen: BLOOD RIGHT HAND  Result Value Ref Range Status   Specimen Description BLOOD RIGHT HAND  Final   Special Requests   Final    BOTTLES DRAWN AEROBIC AND ANAEROBIC Blood Culture adequate volume   Culture   Final    NO GROWTH 4 DAYS Performed at [facility], [address], [place], [place] [address]    Report Status PENDING  Incomplete   Labs: CBC: Recent Labs  Lab 04/20/23 1522 04/21/23 0137 04/21/23 1955 04/22/23 0210 04/22/23 0613  WBC 17.4* 14.5*  --  15.3*  --   NEUTROABS 15.2*  --   --   --   --   HGB 11.3* 11.8* 11.7* 10.0* 10.5*  HCT 37.2* 38.3* 37.4* 32.2* 34.6*  MCV 86.9 84.7  --  85.0  --   PLT 252 279  --  320  --    Basic Metabolic Panel: Recent Labs  Lab 04/20/23 1522 04/21/23 0137 04/22/23 0210  NA 136 136 135  K 4.6 4.1 3.7  CL 103 101 103  CO2 19* 21* 18*  GLUCOSE 102* 77 90  BUN 39* 40* 51*  CREATININE 3.06* 2.61* 2.19*  CALCIUM 7.6* 7.9* 7.5*  MG 1.9 2.1  --   PHOS  --  4.4  --    Liver Function Tests: Recent Labs  Lab 04/20/23 1522  AST 75*  ALT 43  ALKPHOS 76  BILITOT 1.2  PROT 5.8*  ALBUMIN 2.1*   CBG: Recent Labs  Lab 04/20/23 1638 04/20/23 2121  GLUCAP 93 88    Discharge time spent: greater than 30 minutes.  Signed: Lynden Oxford, MD [facility]

## 2023-04-24 NOTE — Plan of Care (Signed)
  Problem: Education: Goal: Knowledge of disease or condition will improve Outcome: Progressing Goal: Knowledge of secondary prevention will improve (MUST DOCUMENT ALL) Outcome: Progressing   Problem: Nutrition: Goal: Risk of aspiration will decrease Outcome: Progressing   Problem: Activity: Goal: Risk for activity intolerance will decrease Outcome: Progressing   Problem: Pain Managment: Goal: General experience of comfort will improve Outcome: Progressing   Problem: Skin Integrity: Goal: Risk for impaired skin integrity will decrease Outcome: Progressing

## 2023-04-24 NOTE — TOC Initial Note (Addendum)
Transition of Care (TOC) - Initial/Assessment Note   Spoke to patient's wife Naji Pelz via phone. Confirmed plan is home with hospice through [facility] . DME agency at their home currently delivering and setting up DME. She will call NCM back when DME is ready .   NCM will arrange [facility]   Confirmed address with [facility].   [facility] [place] called back and is ready for [facility] to be called. Secure chatted team.    1143 MD and RN ready for patient to be transported home. PT[facility] called estimated time of arrival 1 hour, wife and nurse aware  Patient Details  Name: Peter Diaz MRN: [id number] Date of Birth: [birth year]  Transition of Care [facility] CM/SW Contact:    Kingsley Plan, RN Phone Number: 04/24/2023, 10:34 AM  Clinical Narrative:                   Expected Discharge Plan: Home w Hospice Care Barriers to Discharge: ED DME delivery (DME)   Patient Goals and CMS Choice     Choice offered to / list presented to : [facility] POA / Guardian, Spouse, Sibling      Expected Discharge Plan and Services   Discharge Planning Services: CM Consult   Living arrangements for the past 2 months: Single Family Home                 DME Arranged:  (see note)           HH Agency: [facility] Date HH Agency Contacted: 04/23/23 Time HH Agency Contacted: 1130 Representative spoke with at [facility] Agency: Dionicio Stall  Prior Living Arrangements/Services Living arrangements for the past 2 months: Single Family Home Lives with:: Spouse Patient language and need for interpreter reviewed:: Yes        Need for Family Participation in Patient Care: Yes (Comment) Care giver support system in place?: Yes (comment)   Criminal Activity/Legal Involvement Pertinent to Current Situation/Hospitalization: No - Comment as needed  Activities of Daily Living      Permission Sought/Granted   Permission granted to share information with : Yes, Verbal Permission Granted  Share  Information with NAME: Kaseem Benites  Permission granted to share info w AGENCY: [facility]        Emotional Assessment       Orientation: : Oriented to Self Alcohol / Substance Use: Not Applicable Psych Involvement: No (comment)  Admission diagnosis:  Disorientation [R41.0] STEMI (ST elevation myocardial infarction) (HCC) [I21.3] ST elevation myocardial infarction (STEMI), unspecified artery (HCC) [I21.3] Cerebrovascular accident (CVA) due to occlusion of left posterior cerebral artery (HCC) [I63.532] Patient Active Problem List   Diagnosis Date Noted   Gastrointestinal hemorrhage with melena 04/22/2023   Cerebrovascular accident (CVA) due to occlusion of left posterior cerebral artery (HCC) 04/21/2023   Acute respiratory failure with hypoxia (HCC) 04/21/2023   Encephalopathy acute 04/21/2023   STEMI (ST elevation myocardial infarction) (HCC) 04/20/2023   Rash 03/19/2023   Hypokalemia 11/27/2022   Encounter for antineoplastic immunotherapy 04/08/2022   Squamous cell carcinoma of bronchus in left lower lobe (HCC) 01/17/2022   Encounter for antineoplastic chemotherapy 01/17/2022   Lung mass 12/31/2021   Cavitating mass of lung 12/31/2021   PCP:  [name], MD Pharmacy:   [id number] [place] Otto, [place] - [address] AT [facility] & [address] [place] [address] Phone: [phone] Fax: [phone]  [facility] [address] [place] [address] Phone: [phone] Fax: [phone]     Social Determinants of Health (SDOH) Social History: SDOH Screenings   Tobacco Use: Medium Risk (04/20/2023)   SDOH Interventions:     Readmission Risk Interventions     No data to display

## 2023-04-25 LAB — CULTURE, BLOOD (ROUTINE X 2): Special Requests: ADEQUATE

## 2023-04-28 ENCOUNTER — Ambulatory Visit: Payer: PPO | Admitting: Dermatology

## 2023-05-10 DEATH — deceased

## 2023-05-27 ENCOUNTER — Telehealth: Payer: Self-pay | Admitting: Medical Oncology

## 2023-05-27 NOTE — Telephone Encounter (Signed)
DOS requested for first  and last date of treatment .Done.

## 2023-05-29 ENCOUNTER — Telehealth: Payer: Self-pay | Admitting: *Deleted

## 2023-05-29 NOTE — Telephone Encounter (Signed)
Peter Diaz spouse in office with [facility] Authorization for Use and Disclosure.  "[name] passed away 05-14-23.  We have a cancer policy I need his records to submit a claim." Advised to sign her name on the line below the patient signature line, indicating she is spouse per (SW) H.I.M.  Request has her e-mail address [email or web address] to receive records.  Faxed form to (SW) H.I.M.  Currently denies questions or needs.  No further actions required or performed by this nurse.

## 2023-06-30 ENCOUNTER — Telehealth: Payer: Self-pay | Admitting: Medical Oncology

## 2023-06-30 NOTE — Telephone Encounter (Signed)
Wife called about the cancer policy. What is the status? CC Roz and Jeri.

## 2023-07-01 ENCOUNTER — Encounter: Payer: Self-pay | Admitting: Medical Oncology

## 2023-07-08 ENCOUNTER — Telehealth: Payer: Self-pay | Admitting: *Deleted

## 2023-07-08 NOTE — Telephone Encounter (Signed)
Today, this nurse completed Cancer Claim form for Erwin Rasheed Wilfong's spouse Peter Diaz to review, sign and return to this nurse to return to spouse to file claim.  Patient expired.

## 2023-07-09 NOTE — Telephone Encounter (Signed)
Received form signed by provider.  Connected with spouse, Wesly Counihan [phone]).  Advised form is ready for pick-up.  Claim asks for charges for each date of service.  Appointments included however advised she connect with [facility] [employer] [phone]). "I will pick envelope up on Friday from the other forms nurse.  I have something that was printed to send to them to figure it out.  Thank you for your help." Denies questions or needs.
